# Patient Record
Sex: Male | Born: 1935 | Race: White | Hispanic: No | Smoking: Former smoker
Health system: Southern US, Community
[De-identification: ages and names within clinical notes are randomized; demographics above are authoritative.]

## PROBLEM LIST (undated history)

## (undated) DIAGNOSIS — F329 Major depressive disorder, single episode, unspecified: Secondary | ICD-10-CM

## (undated) DIAGNOSIS — E119 Type 2 diabetes mellitus without complications: Secondary | ICD-10-CM

## (undated) DIAGNOSIS — Z973 Presence of spectacles and contact lenses: Secondary | ICD-10-CM

## (undated) DIAGNOSIS — I10 Essential (primary) hypertension: Secondary | ICD-10-CM

## (undated) DIAGNOSIS — F32A Depression, unspecified: Secondary | ICD-10-CM

## (undated) DIAGNOSIS — I4819 Other persistent atrial fibrillation: Secondary | ICD-10-CM

## (undated) DIAGNOSIS — I350 Nonrheumatic aortic (valve) stenosis: Secondary | ICD-10-CM

## (undated) DIAGNOSIS — E7800 Pure hypercholesterolemia, unspecified: Secondary | ICD-10-CM

## (undated) DIAGNOSIS — K219 Gastro-esophageal reflux disease without esophagitis: Secondary | ICD-10-CM

## (undated) DIAGNOSIS — E1151 Type 2 diabetes mellitus with diabetic peripheral angiopathy without gangrene: Secondary | ICD-10-CM

## (undated) DIAGNOSIS — Z9289 Personal history of other medical treatment: Secondary | ICD-10-CM

## (undated) DIAGNOSIS — M1990 Unspecified osteoarthritis, unspecified site: Secondary | ICD-10-CM

## (undated) DIAGNOSIS — I2510 Atherosclerotic heart disease of native coronary artery without angina pectoris: Secondary | ICD-10-CM

## (undated) DIAGNOSIS — E039 Hypothyroidism, unspecified: Secondary | ICD-10-CM

## (undated) HISTORY — DX: Hypothyroidism, unspecified: E03.9

## (undated) HISTORY — PX: CORONARY ANGIOPLASTY WITH STENT PLACEMENT: SHX49

## (undated) HISTORY — DX: Gastro-esophageal reflux disease without esophagitis: K21.9

## (undated) HISTORY — DX: Presence of spectacles and contact lenses: Z97.3

## (undated) HISTORY — PX: BACK SURGERY: SHX140

## (undated) HISTORY — PX: POSTERIOR FUSION LUMBAR SPINE: SUR632

## (undated) HISTORY — DX: Atherosclerotic heart disease of native coronary artery without angina pectoris: I25.10

## (undated) HISTORY — DX: Other persistent atrial fibrillation: I48.19

## (undated) HISTORY — DX: Nonrheumatic aortic (valve) stenosis: I35.0

## (undated) HISTORY — DX: Essential (primary) hypertension: I10

---

## 1989-06-03 DIAGNOSIS — Z9289 Personal history of other medical treatment: Secondary | ICD-10-CM

## 1989-06-03 HISTORY — DX: Personal history of other medical treatment: Z92.89

## 1995-10-04 HISTORY — PX: LUMBAR DISC SURGERY: SHX700

## 1999-01-08 ENCOUNTER — Encounter: Payer: Self-pay | Admitting: Cardiovascular Disease

## 1999-01-08 ENCOUNTER — Inpatient Hospital Stay (HOSPITAL_COMMUNITY): Admission: EM | Admit: 1999-01-08 | Discharge: 1999-01-09 | Payer: Self-pay | Admitting: Emergency Medicine

## 1999-01-09 ENCOUNTER — Encounter: Payer: Self-pay | Admitting: Cardiovascular Disease

## 1999-05-08 ENCOUNTER — Encounter: Payer: Self-pay | Admitting: Emergency Medicine

## 1999-05-08 ENCOUNTER — Emergency Department (HOSPITAL_COMMUNITY): Admission: EM | Admit: 1999-05-08 | Discharge: 1999-05-08 | Payer: Self-pay | Admitting: Emergency Medicine

## 1999-08-16 ENCOUNTER — Encounter: Payer: Self-pay | Admitting: Gastroenterology

## 1999-08-16 ENCOUNTER — Ambulatory Visit (HOSPITAL_COMMUNITY): Admission: RE | Admit: 1999-08-16 | Discharge: 1999-08-16 | Payer: Self-pay | Admitting: Gastroenterology

## 1999-09-09 ENCOUNTER — Inpatient Hospital Stay (HOSPITAL_COMMUNITY): Admission: EM | Admit: 1999-09-09 | Discharge: 1999-09-13 | Payer: Self-pay | Admitting: Emergency Medicine

## 1999-09-09 ENCOUNTER — Encounter: Payer: Self-pay | Admitting: Cardiology

## 1999-12-17 ENCOUNTER — Ambulatory Visit (HOSPITAL_COMMUNITY): Admission: RE | Admit: 1999-12-17 | Discharge: 1999-12-17 | Payer: Self-pay | Admitting: Cardiology

## 2000-01-25 ENCOUNTER — Encounter (HOSPITAL_COMMUNITY): Admission: RE | Admit: 2000-01-25 | Discharge: 2000-02-07 | Payer: Self-pay | Admitting: Cardiology

## 2001-08-21 ENCOUNTER — Ambulatory Visit (HOSPITAL_COMMUNITY): Admission: RE | Admit: 2001-08-21 | Discharge: 2001-08-22 | Payer: Self-pay | Admitting: Cardiology

## 2006-03-20 ENCOUNTER — Encounter: Admission: RE | Admit: 2006-03-20 | Discharge: 2006-03-20 | Payer: Self-pay | Admitting: Otolaryngology

## 2010-08-20 ENCOUNTER — Inpatient Hospital Stay (HOSPITAL_COMMUNITY): Admission: EM | Admit: 2010-08-20 | Discharge: 2010-08-26 | Payer: Self-pay | Admitting: Emergency Medicine

## 2010-08-20 ENCOUNTER — Ambulatory Visit: Payer: Self-pay | Admitting: Cardiovascular Disease

## 2010-08-20 ENCOUNTER — Ambulatory Visit: Payer: Self-pay | Admitting: Internal Medicine

## 2010-10-03 HISTORY — PX: CHOLECYSTECTOMY: SHX55

## 2010-12-15 LAB — LIPID PANEL
LDL Cholesterol: 80 mg/dL (ref 0–99)
Total CHOL/HDL Ratio: 6.3 RATIO
VLDL: 36 mg/dL (ref 0–40)

## 2010-12-15 LAB — BASIC METABOLIC PANEL
BUN: 10 mg/dL (ref 6–23)
BUN: 13 mg/dL (ref 6–23)
CO2: 21 mEq/L (ref 19–32)
CO2: 23 mEq/L (ref 19–32)
Calcium: 9.3 mg/dL (ref 8.4–10.5)
Chloride: 100 mEq/L (ref 96–112)
Chloride: 104 mEq/L (ref 96–112)
Chloride: 97 mEq/L (ref 96–112)
Creatinine, Ser: 0.75 mg/dL (ref 0.4–1.5)
GFR calc Af Amer: >60 mL/min (ref >60)
GFR calc Af Amer: >60 mL/min (ref >60)
GFR calc non Af Amer: >60 mL/min (ref >60)
Glucose, Bld: 402 mg/dL — ABNORMAL HIGH (ref 70–99)
Potassium: 3.9 mEq/L (ref 3.5–5.1)
Potassium: 4.1 mEq/L (ref 3.5–5.1)
Sodium: 133 mEq/L — ABNORMAL LOW (ref 135–145)

## 2010-12-15 LAB — CBC
HCT: 40.3 % (ref 39.0–52.0)
HCT: 40.9 % (ref 39.0–52.0)
HCT: 41.2 % (ref 39.0–52.0)
Hemoglobin: 12.8 g/dL — ABNORMAL LOW (ref 13.0–17.0)
Hemoglobin: 13.1 g/dL (ref 13.0–17.0)
Hemoglobin: 13.2 g/dL (ref 13.0–17.0)
Hemoglobin: 13.7 g/dL (ref 13.0–17.0)
MCH: 26.4 pg (ref 26.0–34.0)
MCH: 26.7 pg (ref 26.0–34.0)
MCH: 27 pg (ref 26.0–34.0)
MCHC: 32 g/dL (ref 30.0–36.0)
MCV: 83.2 fL (ref 78.0–100.0)
MCV: 83.6 fL (ref 78.0–100.0)
MCV: 84 fL (ref 78.0–100.0)
MCV: 84.2 fL (ref 78.0–100.0)
Platelets: 172 10*3/uL (ref 150–400)
Platelets: 186 10*3/uL (ref 150–400)
RBC: 4.8 MIL/uL (ref 4.22–5.81)
RBC: 4.82 MIL/uL (ref 4.22–5.81)
RBC: 4.95 MIL/uL (ref 4.22–5.81)
RBC: 5.18 MIL/uL (ref 4.22–5.81)
RDW: 15.4 % (ref 11.5–15.5)
RDW: 15.5 % (ref 11.5–15.5)
WBC: 4.3 10*3/uL (ref 4.0–10.5)
WBC: 5.1 10*3/uL (ref 4.0–10.5)
WBC: 5.8 10*3/uL (ref 4.0–10.5)

## 2010-12-15 LAB — GLUCOSE, CAPILLARY
Glucose-Capillary: 168 mg/dL — ABNORMAL HIGH (ref 70–99)
Glucose-Capillary: 190 mg/dL — ABNORMAL HIGH (ref 70–99)
Glucose-Capillary: 204 mg/dL — ABNORMAL HIGH (ref 70–99)
Glucose-Capillary: 206 mg/dL — ABNORMAL HIGH (ref 70–99)
Glucose-Capillary: 211 mg/dL — ABNORMAL HIGH (ref 70–99)
Glucose-Capillary: 224 mg/dL — ABNORMAL HIGH (ref 70–99)
Glucose-Capillary: 253 mg/dL — ABNORMAL HIGH (ref 70–99)
Glucose-Capillary: 269 mg/dL — ABNORMAL HIGH (ref 70–99)
Glucose-Capillary: 273 mg/dL — ABNORMAL HIGH (ref 70–99)
Glucose-Capillary: 274 mg/dL — ABNORMAL HIGH (ref 70–99)
Glucose-Capillary: 334 mg/dL — ABNORMAL HIGH (ref 70–99)

## 2010-12-15 LAB — MAGNESIUM
Magnesium: 1.7 mg/dL (ref 1.5–2.5)
Magnesium: 1.8 mg/dL (ref 1.5–2.5)

## 2010-12-15 LAB — HEPATIC FUNCTION PANEL
ALT: 128 U/L — ABNORMAL HIGH (ref 0–53)
ALT: 174 U/L — ABNORMAL HIGH (ref 0–53)
AST: 33 U/L (ref 0–37)
Alkaline Phosphatase: 199 U/L — ABNORMAL HIGH (ref 39–117)
Alkaline Phosphatase: 228 U/L — ABNORMAL HIGH (ref 39–117)
Bilirubin, Direct: 0.5 mg/dL — ABNORMAL HIGH (ref 0.0–0.3)
Bilirubin, Direct: 0.5 mg/dL — ABNORMAL HIGH (ref 0.0–0.3)
Indirect Bilirubin: 0.6 mg/dL (ref 0.3–0.9)
Indirect Bilirubin: 0.7 mg/dL (ref 0.3–0.9)
Total Bilirubin: 1.1 mg/dL (ref 0.3–1.2)
Total Protein: 6.7 g/dL (ref 6.0–8.3)

## 2010-12-15 LAB — CARDIAC PANEL(CRET KIN+CKTOT+MB+TROPI)
CK, MB: 1.6 ng/mL (ref 0.3–4.0)
Relative Index: INVALID (ref 0.0–2.5)
Total CK: 56 U/L (ref 7–232)
Troponin I: 0.04 ng/mL (ref 0.00–0.06)

## 2010-12-15 LAB — PROTIME-INR
INR: 0.96 (ref 0.00–1.49)
INR: 1.56 — ABNORMAL HIGH (ref 0.00–1.49)
Prothrombin Time: 13.1 seconds (ref 11.6–15.2)
Prothrombin Time: 18.9 seconds — ABNORMAL HIGH (ref 11.6–15.2)

## 2010-12-15 LAB — CULTURE, BLOOD (ROUTINE X 2)
Culture  Setup Time: 201111191342
Culture: NO GROWTH

## 2010-12-15 LAB — HEMOGLOBIN A1C: Hgb A1c MFr Bld: 9.3 % — ABNORMAL HIGH (ref <5.7)

## 2010-12-15 LAB — HEPARIN LEVEL (UNFRACTIONATED)
Heparin Unfractionated: 0.15 IU/mL — ABNORMAL LOW (ref 0.30–0.70)
Heparin Unfractionated: 0.2 IU/mL — ABNORMAL LOW (ref 0.30–0.70)

## 2010-12-15 LAB — POCT I-STAT, CHEM 8
Calcium, Ion: 1.11 mmol/L — ABNORMAL LOW (ref 1.12–1.32)
Chloride: 103 mEq/L (ref 96–112)
Glucose, Bld: 422 mg/dL — ABNORMAL HIGH (ref 70–99)
HCT: 43 % (ref 39.0–52.0)
TCO2: 19 mmol/L (ref 0–100)

## 2010-12-15 LAB — TSH
TSH: 1.953 u[IU]/mL (ref 0.350–4.500)
TSH: 7.529 u[IU]/mL — ABNORMAL HIGH (ref 0.350–4.500)

## 2010-12-15 LAB — POCT CARDIAC MARKERS: Troponin i, poc: <0.05 ng/mL (ref 0.00–0.09)

## 2010-12-15 LAB — DIFFERENTIAL
Basophils Absolute: 0 10*3/uL (ref 0.0–0.1)
Basophils Relative: 0 % (ref 0–1)
Eosinophils Relative: 0 % (ref 0–5)
Monocytes Absolute: 0.6 10*3/uL (ref 0.1–1.0)
Monocytes Relative: 9 % (ref 3–12)
Neutro Abs: 5.7 10*3/uL (ref 1.7–7.7)

## 2010-12-15 LAB — CK TOTAL AND CKMB (NOT AT ARMC)
CK, MB: 1.6 ng/mL (ref 0.3–4.0)
Total CK: 62 U/L (ref 7–232)

## 2011-02-18 NOTE — H&P (Signed)
[place]. [facility]  Patient:    Riley Jordan                         MRN: [id number] Adm. Date:  [id number] Attending:  Corliss Marcus Dictator:   Anselm Lis, N.P. CC:         Dr. Jeannetta Nap                         History and Physical  PRIMARY CARE PHYSICIAN:  Dr. Jeannetta Nap.  SUBJECTIVE:  Mr. Gloor is a pleasant 75 year old with history of hypertension, hypothyroidism who, while driving home, developed left anterior chest pressure ith radiation to jaw, with associated nausea and diaphoresis.  Rated 9/10 on pain scale.  He took four baby aspirin, one sublingual nitrate, with little improvement. Summoned EMS, where he received an additional four sublingual nitrates with an improvement in pain to 4/10 from prior 9/10.  Presented to [facility] emergency  room.  EKG was consistent with inferior myocardial infarction.  He was initiated on IV heparin and IV nitrates, and taken urgently for cardiac catheterization for coronary angiogram, probable PCI, by Dr. Amil Amen.  Patient did have an episode of shortness of breath in April 2000, for which he subsequently underwent a stress test by Dr. Leodis Sias, which was reportedly okay.  CARDIAC RISK FACTORS:  Age, male sex, hypertension, remote tobacco use.  PREVIOUS MEDICAL HISTORY:  1. Hypertension.  2. Hypothyroidism.  3. GERD.  Previous surgical history of lower back surgery x 2 in 1998 by Dr. Montez Morita in [place].  Patient denies history of peptic ulcer disease, diabetes mellitus, cancer, asthma, nor cardiac murmur.  ALLERGIES:  No known drug allergies; okay with seafood, shellfish, and IV products.  MEDICATIONS: 1. Verapamil (uncertain dosage) once daily. 2. Prevacid 30 mg 1 p.o. q.d. 3. Synthroid 112 mcg p.o. q.d. 4. Sublingual nitrate p.r.n.  SOCIAL/HABITS:  Tobacco use - Quit in 1990, prior 35-pack-year history. ETOH - Negative.  Caffeine - None accepted.  Patient works as a IT consultant.  He has been married for 43 years and has two daughters and one son, alive and well, who live locally.  FAMILY HISTORY:  Mother deceased age 59 of multiple myeloma; no CAD.  Dad died t age 10 of leukemia.  Patient has two sisters and one brother without CAD.  REVIEW OF SYSTEMS:  Wears glasses; has upper dentures.  No difficulty with hearing. Denies a history of syncope, near-syncopal episodes, lightheadedness, nor dizziness.  Negative dysphagia with fluid or food.  Positive symptoms of GERD, feeling epigastric discomfort with sour taste, choking, and burning.  Prevacid as improved symptoms.  Negative melena, no bright red blood PR.  Negative constipation or diarrhea.  No hematuria nor dysuria.  Denies history of orthopnea, PND, nor pedal edema.  No symptoms of claudication or cardiac palpitation.  Does have some hand swelling and joint pain in his hands.  PHYSICAL EXAMINATION:  VITAL SIGNS:  Blood pressure 153/108 with heart rate 80s and regular. Respiratory rate 18, temperature 98.2.  O2 saturation 98%.  GENERAL:  He is a well-nourished, anxious 75 year old with continuing chest discomfort, though improved.  HEENT:  Brisk bilateral carotid upstrokes without bruits.  NECK:  No JVD nor thyromegaly.  CHEST:  Lung sounds clear with equal ______ excursion.  Negative CPA tenderness.  CARDIAC:  Regular rate and rhythm without murmur, rub, nor gallop.  Normal S1  and S2.  ABDOMEN:  Protruding, normal bowel sounds, nontender to palpation, no masses nor organomegaly.  Negative abdominal aorta, reveals no femoral bruit.  EXTREMITIES:  Reveals +2/4 bilateral radial, femoral, dorsalis pedis, and posterior tibial, negative pedal edema.  NEUROLOGIC:  Cranial nerves II-XII grossly intact; alert and oriented x 3.  GENITAL/RECTAL:  Deferred.  LABORATORY TESTS AND DATA:  Revealed a hemoglobin of 15.1 with WBCs 8.5 and platelets of 243.  Sodium 137, K 3.6, chloride  99, CO2 30, BUN 10, creatinine 1.2, and glucose 101.  LFTs within normal range.  PT is 13.6 with INR of 1.1 and PTT 28. First CK of 142 with MB fraction 4.2, relative index 3.2, troponin I of 0.04.  Chest x-ray revealed no active disease.  EKG revealed NSR, 84 beats per minute, with ST elevation inferiorly and anterior ST depression/flattening consistent with acute inferior myocardial infarction.  IMPRESSION: 1. Acute inferior myocardial infarction. 2. History of hypertension. 3. Hypothyroidism; supplemented.  PLAN:  Urgently for cardiac catheterization and subsequent coronary angiogram, nd anticipated percutaneous intervention, if able.  The risks, potential complications, benefits, and alternatives to procedure discussed with Mr. Evrard. Patient indicates questions and concerns have been addressed, and is agreeable o proceed. DD:  09/10/99 TD:  09/11/99 Job: [id number]

## 2011-02-18 NOTE — Cardiovascular Report (Signed)
[place]. [facility]  Patient:    Riley Jordan                         MRN: [id number] Proc. Date: 09/09/99 Adm. Date:  [id number] Attending:  Corliss Marcus CC:         Hadassah Pais. Jeannetta Nap, M.D.             Cardiac Catheterization Laboratory                        Cardiac Catheterization  CINE NUMBER:  [id number]  PROCEDURES PERFORMED: 1. Left heart catheterization. 2. Coronary angiography. 3. Left ventriculogram. 4. Percutaneous transluminal coronary angioplasty with stent implantation, large    circumflex marginal branch. 5. Percutaneous transluminal coronary angioplasty with stent implantation, mid    left anterior descending.  INDICATIONS:  Mr. Riley Jordan is a 75 year old man who presented to the emergency room today about 1830 hours complaining of anterior substernal chest pain earlier, associated with nausea and diaphoresis.  An electrocardiogram was diagnostic of  inferior wall myocardial infarction.  He is brought now to the cardiac catheterization laboratory to identify the extent of disease and to allow for percutaneous coronary intervention as definitive treatment for acute MI.  DESCRIPTION OF PROCEDURE:  The patient was brought to the cardiac catheterization laboratory where the right groin was prepared and draped in the usual sterile fashion.  Local anesthesia was obtained with the infiltration of 0.1% lidocaine.  A 7 French catheter sheath was introduced percutaneously into the right femoral artery utilizing an anterior approach over a guiding J wire.  The patient then received 4000 units of heparin intravenously.  A 6 French #4 left Judkins catheter was then advanced to the ascending aorta where the left coronary os was engaged and cineangiography performed to the left coronary artery in multiple LAO and RAO projections.  This catheter was then exchanged for a 6 [place] #4 right Judkins catheter.  Cineangiography of the right coronary  artery was conducted in multiple LAO and RAO projections.  The right coronary catheter was then exchanged for a  French 110 cm pigtail catheter.  Pressure was recorded with in catheter in the ascending aorta and in the left ventricle, both prior to and following the ventriculogram.  A 30 degree RAO cine left ventriculogram was performed utilizing a power injector.  Then, 45 cc of nonionic contrast material was injected at 13 cc/sec.  Preparations were then made to proceed with coronary intervention.  The pigtail  catheter was exchanged for a 7 [place] FL4 Sci-Med Wiseguide catheter.  This was  advanced to the ascending aorta where the left coronary os was engaged.  A 0.014 inch Sci-Med luge intracoronary guide wire was passed easily across the complete obstruction in a large marginal branch.  Initial balloon dilatation was performed with a 3.0/20 mm Sci-Med Adante catheter.  This was inflated to a peak pressure of 6 atmospheres for peak duration of 60 seconds.  The Adante balloon was then removed and an attempt was made to place a 3.0/25 mm NIR Royal coronary stent. However, this would not advance into across the lesion.  It was therefore removed and exchanged for a ACS Guidant Tetra 3.0/23 mm intracoronary stent.  This was placed across the lesion and ultimately deployed at 16 atmospheres for 73 seconds.  A second 3.5/13 mm Tetra stent was then placed proximally and ultimately deployed at 12 atmospheres.  There was  a 70% residual stenosis distal to the stent. Initially, this was treated with the 3.0/20 mm Adante with a reasonable angiographic result. Attention was then directed to the mid LAD where there was an 80% stenosis. The luge intracoronary guide wire was advanced across the LAD lesion and the Adante  balloon inflated to 6 atmospheres across this lesion.  It was then withdrawn and the 3.0/25 mm NIR Royal advanced across the lesion.  This was ultimately deployed at  16 atmospheres for 37 seconds.  While performing coronary angiography in orthogonal views, after completing the LAD dilatation, it was noted that the distal lesion in the circumflex had recoiled and was 80% stenotic.  Therefore the wire was withdrawn and advanced into the circumflex artery.  A 3.0/16 mm NIR with SOX was chosen and advanced across the previously stented segment into the distal portion. It was ultimately deployed at 13 atmospheres for 79 seconds.  After confirming adequate patency in the orthogonal views before the left circumflex artery, the wire was withdrawn and once again advanced across the lesion in the left anterior descending artery where the stented segment was present.  3.25/13 mm Sci-Med Maxxum balloon was passed into the proximal portion of the stent and ultimately inflated to 18 atmospheres for 49 seconds.  This was because of  residual indentation on the superior aspect of the LAD stent.  At the completion of the procedure, and following confirmation of adequate patency in orthogonal views for both the left circumflex and left anterior descending artery, the guiding catheter was removed.  The catheter sheath was sutured in place.  The patient was transported to the recovery area in stable condition with intact distal pulses.  FLUOROSCOPY TIME:  27.1 minutes.  TOTAL CONTRAST UTILIZED:  50 cc of Omnipaque and 370 cc of Hexabrix.  The patient also received the previously mentioned 4000 units of heparin and an ACT obtained was 173 seconds, and therefore received an additional 2500 units of heparin and a subsequent ACT was 272.  At the close of the procedure, the ACT was 227 seconds and the patient received an additional 2000 units of heparin.  HEMODYNAMICS:  Systemic arterial pressure was 162/101 mmHg with a mean pressure of 130 mmHg.  The left ventricular end-diastolic pressure was 36 mmHg preventriculogram and 38 mmHg post ventriculogram.  There  was no systolic gradient across the aortic valve.  ANGIOGRAPHY:  LEFT VENTRICULOGRAM:  The left ventriculogram demonstrated normal left ventricular  size.  There was wide spread akinesis of the anterolateral wall and the inferoapical wall.  There was mild dyskinesis at the apex.  The estimated ejection fraction is 35-40%.  There was left and right coronary calcification seen. There was catheter induced mitral regurgitation present.  CORONARY ANGIOGRAPHY:  The main left coronary artery was mildly diseased with a distal 20% stenosis. There was a right dominant coronary system present.  The left anterior descending artery had a eccentric 80-90% stenosis in the midportion after the origin of two small diagonal branches.  The ongoing vessel was large transapical and diseased.  There did appear to be a 70% stenosis in the distal segment.  The left circumflex artery and its branches were highly diseased; this vessel had a 30% to 50% stenosis in the proximal segment and then gave rise to a large branching marginal branch.  This was 100% occluded proximally.  There was trivial antegrade flow with dye staining indicating thrombus.  The ongoing AV groove circumflex was small giving rise to a small posterolateral  branch.  The right coronary artery and its branches was moderately diseased; this vessel had luminal irregularity throughout its proximal mid and distal segment.  The greatest amount of stenosis was approximately 30-40% in the distal portion.  The ostium f the posterior descending artery displayed a 50% to 70% stenosis.  It was a moderate sized vessel.  The posterolateral branch and segment were of moderate size and generally free of disease.  Following balloon dilatation and stent implantation of the circumflex marginal branch there was no residual stenosis in the midportion of the marginal branch.  The more distal segment that was trifurcating did demonstrate diffuse  disease.  There was TIMI-III flow at completion.  The left anterior descending artery maintained a residual 20% eccentric stenosis on the superior aspect of the vessel despite the oversized balloon and high pressures.  Collateral vessels were not seen.  FINAL DIAGNOSES: 1. Atherosclerotic cardiovascular disease, three-vessel. 2. Status post successful percutaneous transluminal coronary angioplasty stent    implantation, mid left anterior descending. 3. Status post successful percutaneous transluminal coronary angioplasty with    extensive stent implantation, circumflex marginal branch; approximately 35 mm    of stent were implanted. 4. Diminished left ventricular systolic function with regional wall motion    abnormalities as noted. 5. Elevated left ventricular end-diastolic pressure. 6. Systemic hypertension had resolved by the completion of the procedure    following intravenous heparin, intracoronary nitroglycerin, intracoronary    verapamil, and intravenous nitroglycerin as well as intravenous Lopressor 5 g    x 2. DD:  09/09/99 TD:  09/12/99 Job: [id number]

## 2011-02-18 NOTE — Cardiovascular Report (Signed)
[place]. [facility]  Patient:    Riley Jordan, Riley Jordan Visit Number: [id number] MRN: [id number]          Service Type: CAT Location: [id number] Attending Physician:  Corliss Marcus Dictated by:   Francisca December, M.D. Proc. Date: 08/20/01 Admit Date:  08/21/2001   CC:         Scharlene Corn, M.D.             Cardiac Catheterization Laboratory                        Cardiac Catheterization  PROCEDURES PERFORMED: 1. Percutaneous transluminal coronary angioplasty/Cutting Balloon with    adjunctive brachytherapy. 2. Percutaneous closure right femoral artery (Perclose).  INDICATIONS: The patient is a 75 year old man with known ASCVD, two-vessel, status post PTCA stent LAD, September 09, 1999, and PTCA stent LCX, September 09, 1999. He underwent a recent surveillance Cardiolite with exercise stress revealing reversible anterior apical defect. Coronary angiography has revealed diffuse in-stent re-stenosis of the left anterior descending artery stent, which is most severe at the distal exit point amounting to 75% occlusion.  He is brought now to the cardiac catheterization laboratory for percutaneous revascularization and anticipated brachytherapy.  DESCRIPTION OF PROCEDURE: PCI was conducted following the percutaneous insertion of a 7 French catheter sheath utilizing an anterior approach over a guiding J wire into the right femoral artery. The right groin had previously been prepped and draped in the usual sterile fashion. Local anesthesia was obtained with the infiltration of 1% lidocaine. A 7 French FL4 Scimed wiseguide guiding catheter was advanced to the ascending aorta where the left coronary os was engaged. A 0.014 inch Scimed luge intracoronary guide wire was passed across the lesion without difficulty. Initial balloon dilatation was performed with a 3.25/10 mm Scimed Cutting Balloon. It was inflated at three different sites within the distal, mid and  proximal portions of the stent. The distal portion of the stent was dilated with about 5 mm of balloon protruding from the stent. A total of six inflations were performed with a 3.25/10 mm Cutting Balloon to a maximum pressure of 8 atmospheres for a maximum duration of a minute and a half. This balloon was removed and a 3.5/10 mm Scimed Cutting Balloon was deployed into the distal portion of the stent. It was inflated there to 7 atmospheres on two occasions for a maximum duration of a minute and a half. This resulted in wide patency of the stented segment and just distally to it. Several intracoronary injections of nitroglycerin were necessary. The patient had significant amounts of angina with radiation into the jaw. He was treated with intravenous Versed and fentanyl, a total of 4 mg and 100 mcg, respectively.  Finally, intracoronary brachytherapy was conducted using a 40 mm source train. The dwell time was 3 minutes and 16 seconds. Following Cutting Balloon dilatation and intracoronary brachytherapy, there was wide patency in the stented segment just distal to the stent.  It should be noted the patient received 5200 units of heparin intravenously prior to passage of the guide wire. He also received a double bolus and constant infusion during the case. The resultant ACT was 298 seconds following the 263 seconds at completion.  At the completion of the procedure and following conformation of adequate patency in orthogonal views, the guiding catheter was removed. The catheter sheath was removed and excellent hemostasis obtained with use of Perclose system. A right femoral artery angiogram at 45  degrees RAO angulation was conducted prior to insertion of the Perclose device.  ANGIOGRAPHY: As mentioned, the lesion treated was in the midportion of the anterior descending artery, and it was 75% stenotic at its tightest portion. Through most of the stent, it was approximately 50% stenotic.  Following balloon dilatation and adjunctive intracoronary brachytherapy there was a 10% residual stenosis.  It should be noted after establishment of wide patency throughout the stented segment of the LAD, the distal vessel became markedly increased in diameter. This revealed a distal 70-80% stenosis, which had not been previously visualized due to relatively decreased flow and diffuse mild vasospasm.  Angiogram revealed the right femoral artery to be widely patent. There is some luminal irregularity consistent with plaque. The sheath entered well above the bifurcation into the profunda femoral and the suprafemoral arteries.  Upon final review of the LAD angiogram, it is noted there is occlusion of a small to moderate sized septal perforator undoubtedly caused by plaque shifting during balloon inflation. This originates in the proximal portion of the stent. There is also a small diagonal which arises from within the stented segment, which is diffusely diseased. Flow in this vessel is unchanged.  FINAL IMPRESSION: 1. Successful percutaneous transluminal coronary angioplasty Cutting Balloon    with adjunctive brachytherapy mid left anterior descending artery. 2. Typical angina was reproduced with device insertion and balloon inflation. 3. Successful percutaneous closure of right femoral artery. Dictated by:   Francisca December, M.D. Attending Physician:  Corliss Marcus DD:  08/21/01 TD:  08/22/01 Job: [id number]

## 2011-02-18 NOTE — Discharge Summary (Signed)
[place]. [facility]  Patient:    Riley Jordan                         MRN: [id number] Adm. Date:  [id number] Disc. Date: [id number] Attending:  Corliss Marcus Dictator:   Anselm Lis, N.P.                           Discharge Summary  PRIMARY CARE PHYSICIAN:  Hadassah Pais. Jeannetta Nap, M.D.  PROCEDURES:  (September 09, 1999) Percutaneous transluminal coronary angioplasty/stent, left circumflex; percutaneous transluminal coronary angioplasty/stent, left anterior descending.  CONSULTATIONS:  Nutrition consult by registered dietitian regarding low-fat, low-cholesterol diet.  DISCHARGE DIAGNOSIS/HOSPITAL SUMMARY: 1. Coronary atherosclerotic heart disease:  Riley Jordan is a pleasant 75 year old who    presented to the emergency room complaining of anterior chest discomfort with    associated nausea and diaphoresis.  An EKG was diagnostic of an inferior wall    myocardial infarction.  He was brought urgently to cardiac catheterization lab    where he underwent a percutaneous transluminal coronary angioplasty/stent in    both the left circumflex as well as the left anterior descending.  Left    ventriculogram significant for decreased left ventricular systolic function    with ejection fraction of 40%, anterior, inferior and apical akinesis. Residual    disease occluded a large marginal off the left circumflex.  Fifty percent    ostial posterior descending artery of the right coronary artery.    a. Patient ruled in for acute inferior wall myocardial infarction with peak K       of 1910 and MB fraction of 232 with troponin I 12.1.    b. Left ventricular dysfunction; ischemic, with ejection fraction of 40%;       anterior, inferior and apical akinesis. 2. Hypothyroidism; on supplements. 3. History of gastroesophageal reflux disease; on Prevacid.  Patient did well during the course of admission, ambulating with good tolerance  with cardiac rehab.  PLAN: 1. Patient  discharged home in stable and improved condition. 2. Discharge medications:    A. Plavix 75 mg one p.o. q.d. for three weeks, to take with food.    B. Nitroglycerin tablets 0.4 mg sublingual p.r.n. chest pain.    C. Enteric-coated aspirin 325 mg once daily.    D. Lisinopril 10 mg once daily.    E. Toprol XL 50 mg once day.    F. Synthroid 0.112 mg once daily or at dosage as prior to admission.    G. Prevacid 30 mg once daily. 3. Activity:  As outlined by cardiac rehab.  No work at least until after seen y    Dr. Amil Amen in clinic. 4. Diet:  Low fat, low cholesterol. 5. Wound care:  May shower. 6. Special instructions:  Stop Verapamil. 7. Followup:  Dr. Corliss Marcus, Thursday, September 30, 1999, at 11 a.m.  PREVIOUS MEDICAL HISTORY: 1. Hypertension. 2. Hypothyroidism. 3. GERD.  PREVIOUS SURGICAL HISTORY:  Lower back surgery x 2 in 1997 and 1998 by Dr. Montez Morita in [place], [place].  LABORATORY TESTS AND DATA:  Cholesterol 169 with triglycerides 139, HDL of 22 and LDL of 120.  Sodium 137, potassium of 3.6, chloride 99, CO2 of 30, glucose 101, BUN of 10, creatinine 1.0.  LFTs within normal range, though total bilirubin elevated at 1.2.  Serial cardiac enzymes significant for peak CK of 1910 with MB fraction 232 and troponin  I of 12.1.  CBC:  WBC of 8.5 with hemoglobin of 15.1 and platelets of 243,000.  Admission coagulations were within normal range.  Admission chest x-ray revealed no active disease.  Admission EKG revealed NSR at 84 beats per minute, ST elevation inferiorly with  anterior ST depression/flattening, consistent with acute inferior myocardial infarction. DD:  11/04/99 TD:  11/05/99 Job: [id number]

## 2011-03-08 ENCOUNTER — Emergency Department (HOSPITAL_COMMUNITY): Payer: Medicare Other

## 2011-03-08 ENCOUNTER — Inpatient Hospital Stay (HOSPITAL_COMMUNITY)
Admission: EM | Admit: 2011-03-08 | Discharge: 2011-03-15 | DRG: 853 | Disposition: A | Discharge status: Skilled Nursing Facility | Payer: Medicare Other | Attending: Internal Medicine | Admitting: Internal Medicine

## 2011-03-08 DIAGNOSIS — I2510 Atherosclerotic heart disease of native coronary artery without angina pectoris: Secondary | ICD-10-CM | POA: Diagnosis present

## 2011-03-08 DIAGNOSIS — IMO0001 Reserved for inherently not codable concepts without codable children: Secondary | ICD-10-CM | POA: Diagnosis present

## 2011-03-08 DIAGNOSIS — K838 Other specified diseases of biliary tract: Secondary | ICD-10-CM | POA: Diagnosis present

## 2011-03-08 DIAGNOSIS — K8020 Calculus of gallbladder without cholecystitis without obstruction: Secondary | ICD-10-CM | POA: Diagnosis present

## 2011-03-08 DIAGNOSIS — E8779 Other fluid overload: Secondary | ICD-10-CM | POA: Diagnosis not present

## 2011-03-08 DIAGNOSIS — E876 Hypokalemia: Secondary | ICD-10-CM | POA: Diagnosis not present

## 2011-03-08 DIAGNOSIS — E872 Acidosis, unspecified: Secondary | ICD-10-CM | POA: Diagnosis present

## 2011-03-08 DIAGNOSIS — I4891 Unspecified atrial fibrillation: Secondary | ICD-10-CM | POA: Diagnosis present

## 2011-03-08 DIAGNOSIS — A4151 Sepsis due to Escherichia coli [E. coli]: Principal | ICD-10-CM | POA: Diagnosis present

## 2011-03-08 DIAGNOSIS — E039 Hypothyroidism, unspecified: Secondary | ICD-10-CM | POA: Diagnosis present

## 2011-03-08 DIAGNOSIS — A419 Sepsis, unspecified organism: Secondary | ICD-10-CM | POA: Diagnosis present

## 2011-03-08 DIAGNOSIS — Z794 Long term (current) use of insulin: Secondary | ICD-10-CM

## 2011-03-08 DIAGNOSIS — Z7901 Long term (current) use of anticoagulants: Secondary | ICD-10-CM

## 2011-03-08 DIAGNOSIS — I10 Essential (primary) hypertension: Secondary | ICD-10-CM | POA: Diagnosis present

## 2011-03-08 DIAGNOSIS — Z7982 Long term (current) use of aspirin: Secondary | ICD-10-CM

## 2011-03-08 DIAGNOSIS — K859 Acute pancreatitis without necrosis or infection, unspecified: Secondary | ICD-10-CM | POA: Diagnosis present

## 2011-03-08 LAB — URINALYSIS, ROUTINE W REFLEX MICROSCOPIC
Bilirubin Urine: NEGATIVE
Nitrite: NEGATIVE
Specific Gravity, Urine: 1.028 (ref 1.005–1.030)
Urobilinogen, UA: 1 mg/dL (ref 0.0–1.0)

## 2011-03-08 LAB — COMPREHENSIVE METABOLIC PANEL
Albumin: 3.9 g/dL (ref 3.5–5.2)
BUN: 15 mg/dL (ref 6–23)
Chloride: 100 mEq/L (ref 96–112)
Creatinine, Ser: 0.77 mg/dL (ref 0.4–1.5)
Total Bilirubin: 3.3 mg/dL — ABNORMAL HIGH (ref 0.3–1.2)

## 2011-03-08 LAB — DIFFERENTIAL
Basophils Absolute: 0 10*3/uL (ref 0.0–0.1)
Basophils Relative: 0 % (ref 0–1)
Neutro Abs: 3.8 10*3/uL (ref 1.7–7.7)
Neutrophils Relative %: 93 % — ABNORMAL HIGH (ref 43–77)

## 2011-03-08 LAB — URINE MICROSCOPIC-ADD ON

## 2011-03-08 LAB — CBC
Hemoglobin: 13.6 g/dL (ref 13.0–17.0)
RBC: 5.05 MIL/uL (ref 4.22–5.81)
WBC: 4.1 10*3/uL (ref 4.0–10.5)

## 2011-03-08 LAB — LIPASE, BLOOD: Lipase: 2420 U/L — ABNORMAL HIGH (ref 11–59)

## 2011-03-08 LAB — PROTIME-INR
INR: 2.39 — ABNORMAL HIGH (ref 0.00–1.49)
Prothrombin Time: 26.2 seconds — ABNORMAL HIGH (ref 11.6–15.2)

## 2011-03-08 LAB — LACTIC ACID, PLASMA: Lactic Acid, Venous: 2.5 mmol/L — ABNORMAL HIGH (ref 0.5–2.2)

## 2011-03-09 LAB — DIFFERENTIAL
Basophils Absolute: 0 10*3/uL (ref 0.0–0.1)
Eosinophils Absolute: 0 10*3/uL (ref 0.0–0.7)
Eosinophils Relative: 0 % (ref 0–5)
Monocytes Absolute: 0.7 10*3/uL (ref 0.1–1.0)
Neutrophils Relative %: 84 % — ABNORMAL HIGH (ref 43–77)

## 2011-03-09 LAB — HEMOGLOBIN A1C: Mean Plasma Glucose: 223 mg/dL — ABNORMAL HIGH (ref <117)

## 2011-03-09 LAB — COMPREHENSIVE METABOLIC PANEL WITH GFR
ALT: 347 U/L — ABNORMAL HIGH (ref 0–53)
AST: 375 U/L — ABNORMAL HIGH (ref 0–37)
Albumin: 2.8 g/dL — ABNORMAL LOW (ref 3.5–5.2)
Alkaline Phosphatase: 224 U/L — ABNORMAL HIGH (ref 39–117)
BUN: 17 mg/dL (ref 6–23)
CO2: 18 meq/L — ABNORMAL LOW (ref 19–32)
Calcium: 8.1 mg/dL — ABNORMAL LOW (ref 8.4–10.5)
Chloride: 103 meq/L (ref 96–112)
Creatinine, Ser: 0.87 mg/dL (ref 0.4–1.5)
GFR calc non Af Amer: >60 mL/min
Glucose, Bld: 325 mg/dL — ABNORMAL HIGH (ref 70–99)
Potassium: 4 meq/L (ref 3.5–5.1)
Sodium: 133 meq/L — ABNORMAL LOW (ref 135–145)
Total Bilirubin: 2.5 mg/dL — ABNORMAL HIGH (ref 0.3–1.2)
Total Protein: 5.5 g/dL — ABNORMAL LOW (ref 6.0–8.3)

## 2011-03-09 LAB — LIPID PANEL
Cholesterol: 114 mg/dL (ref 0–200)
HDL: 31 mg/dL — ABNORMAL LOW
Total CHOL/HDL Ratio: 3.7 ratio
Triglycerides: 102 mg/dL
VLDL: 20 mg/dL (ref 0–40)

## 2011-03-09 LAB — CBC
HCT: 34.4 % — ABNORMAL LOW (ref 39.0–52.0)
Platelets: 122 10*3/uL — ABNORMAL LOW (ref 150–400)
RDW: 15.7 % — ABNORMAL HIGH (ref 11.5–15.5)
WBC: 8.5 10*3/uL (ref 4.0–10.5)

## 2011-03-09 LAB — PROTIME-INR
INR: 2.91 — ABNORMAL HIGH (ref 0.00–1.49)
Prothrombin Time: 30.5 s — ABNORMAL HIGH (ref 11.6–15.2)

## 2011-03-09 LAB — GLUCOSE, CAPILLARY
Glucose-Capillary: 323 mg/dL — ABNORMAL HIGH (ref 70–99)
Glucose-Capillary: 240 mg/dL — ABNORMAL HIGH (ref 70–99)
Glucose-Capillary: 233 mg/dL — ABNORMAL HIGH (ref 70–99)
Glucose-Capillary: 184 mg/dL — ABNORMAL HIGH (ref 70–99)

## 2011-03-09 LAB — APTT: aPTT: 41 seconds — ABNORMAL HIGH (ref 24–37)

## 2011-03-09 LAB — LIPASE, BLOOD: Lipase: 782 U/L — ABNORMAL HIGH (ref 11–59)

## 2011-03-09 LAB — MRSA PCR SCREENING: MRSA by PCR: NEGATIVE

## 2011-03-10 ENCOUNTER — Inpatient Hospital Stay (HOSPITAL_COMMUNITY): Payer: Medicare Other

## 2011-03-10 LAB — COMPREHENSIVE METABOLIC PANEL
ALT: 237 U/L — ABNORMAL HIGH (ref 0–53)
AST: 124 U/L — ABNORMAL HIGH (ref 0–37)
Albumin: 2.6 g/dL — ABNORMAL LOW (ref 3.5–5.2)
Alkaline Phosphatase: 182 U/L — ABNORMAL HIGH (ref 39–117)
BUN: 14 mg/dL (ref 6–23)
Chloride: 104 mEq/L (ref 96–112)
GFR calc Af Amer: >60 mL/min (ref >60)
Potassium: 3.8 mEq/L (ref 3.5–5.1)
Sodium: 136 mEq/L (ref 135–145)
Total Bilirubin: 1.9 mg/dL — ABNORMAL HIGH (ref 0.3–1.2)
Total Protein: 5.5 g/dL — ABNORMAL LOW (ref 6.0–8.3)

## 2011-03-10 LAB — GLUCOSE, CAPILLARY
Glucose-Capillary: 185 mg/dL — ABNORMAL HIGH (ref 70–99)
Glucose-Capillary: 190 mg/dL — ABNORMAL HIGH (ref 70–99)
Glucose-Capillary: 187 mg/dL — ABNORMAL HIGH (ref 70–99)

## 2011-03-10 LAB — PROTIME-INR
INR: 2.67 — ABNORMAL HIGH (ref 0.00–1.49)
Prothrombin Time: 28.5 seconds — ABNORMAL HIGH (ref 11.6–15.2)

## 2011-03-10 LAB — CBC
HCT: 34.5 % — ABNORMAL LOW (ref 39.0–52.0)
Hemoglobin: 11.2 g/dL — ABNORMAL LOW (ref 13.0–17.0)
MCHC: 32.5 g/dL (ref 30.0–36.0)
RBC: 4.19 MIL/uL — ABNORMAL LOW (ref 4.22–5.81)

## 2011-03-10 LAB — DIFFERENTIAL
Basophils Absolute: 0 10*3/uL (ref 0.0–0.1)
Lymphocytes Relative: 9 % — ABNORMAL LOW (ref 12–46)
Monocytes Relative: 7 % (ref 3–12)
Neutro Abs: 7.2 10*3/uL (ref 1.7–7.7)
Neutrophils Relative %: 84 % — ABNORMAL HIGH (ref 43–77)

## 2011-03-11 LAB — COMPREHENSIVE METABOLIC PANEL
ALT: 163 U/L — ABNORMAL HIGH (ref 0–53)
AST: 43 U/L — ABNORMAL HIGH (ref 0–37)
Alkaline Phosphatase: 185 U/L — ABNORMAL HIGH (ref 39–117)
CO2: 20 mEq/L (ref 19–32)
Calcium: 8.7 mg/dL (ref 8.4–10.5)
Chloride: 99 mEq/L (ref 96–112)
GFR calc non Af Amer: >60 mL/min (ref >60)
Glucose, Bld: 165 mg/dL — ABNORMAL HIGH (ref 70–99)
Potassium: 3.4 mEq/L — ABNORMAL LOW (ref 3.5–5.1)
Sodium: 133 mEq/L — ABNORMAL LOW (ref 135–145)
Total Bilirubin: 1.4 mg/dL — ABNORMAL HIGH (ref 0.3–1.2)

## 2011-03-11 LAB — GLUCOSE, CAPILLARY
Glucose-Capillary: 173 mg/dL — ABNORMAL HIGH (ref 70–99)
Glucose-Capillary: 181 mg/dL — ABNORMAL HIGH (ref 70–99)

## 2011-03-11 LAB — CULTURE, BLOOD (ROUTINE X 2)

## 2011-03-12 ENCOUNTER — Inpatient Hospital Stay (HOSPITAL_COMMUNITY): Payer: Medicare Other

## 2011-03-12 ENCOUNTER — Other Ambulatory Visit: Payer: Self-pay | Admitting: General Surgery

## 2011-03-12 LAB — CBC
Hemoglobin: 12.4 g/dL — ABNORMAL LOW (ref 13.0–17.0)
MCHC: 33.4 g/dL (ref 30.0–36.0)
RDW: 15.6 % — ABNORMAL HIGH (ref 11.5–15.5)
WBC: 8.3 10*3/uL (ref 4.0–10.5)

## 2011-03-12 LAB — BASIC METABOLIC PANEL
BUN: 11 mg/dL (ref 6–23)
Creatinine, Ser: 0.66 mg/dL (ref 0.4–1.5)
GFR calc Af Amer: >60 mL/min (ref >60)
GFR calc non Af Amer: >60 mL/min (ref >60)
Potassium: 2.9 mEq/L — ABNORMAL LOW (ref 3.5–5.1)

## 2011-03-12 LAB — PROTIME-INR
INR: 1.88 — ABNORMAL HIGH (ref 0.00–1.49)
Prothrombin Time: 21.8 seconds — ABNORMAL HIGH (ref 11.6–15.2)

## 2011-03-12 LAB — GLUCOSE, CAPILLARY
Glucose-Capillary: 192 mg/dL — ABNORMAL HIGH (ref 70–99)
Glucose-Capillary: 164 mg/dL — ABNORMAL HIGH (ref 70–99)
Glucose-Capillary: 207 mg/dL — ABNORMAL HIGH (ref 70–99)

## 2011-03-13 LAB — CBC
HCT: 38 % — ABNORMAL LOW (ref 39.0–52.0)
MCHC: 32.6 g/dL (ref 30.0–36.0)
MCV: 81.4 fL (ref 78.0–100.0)
RDW: 16 % — ABNORMAL HIGH (ref 11.5–15.5)

## 2011-03-13 LAB — PROTIME-INR: INR: 1.02 (ref 0.00–1.49)

## 2011-03-13 LAB — BASIC METABOLIC PANEL
CO2: 23 mEq/L (ref 19–32)
Chloride: 98 mEq/L (ref 96–112)
Sodium: 132 mEq/L — ABNORMAL LOW (ref 135–145)

## 2011-03-13 LAB — GLUCOSE, CAPILLARY: Glucose-Capillary: 225 mg/dL — ABNORMAL HIGH (ref 70–99)

## 2011-03-13 LAB — MAGNESIUM: Magnesium: 2 mg/dL (ref 1.5–2.5)

## 2011-03-14 LAB — HEPATIC FUNCTION PANEL
AST: 46 U/L — ABNORMAL HIGH (ref 0–37)
Albumin: 2.9 g/dL — ABNORMAL LOW (ref 3.5–5.2)
Alkaline Phosphatase: 206 U/L — ABNORMAL HIGH (ref 39–117)
Total Bilirubin: 0.7 mg/dL (ref 0.3–1.2)

## 2011-03-14 LAB — BASIC METABOLIC PANEL
BUN: 11 mg/dL (ref 6–23)
CO2: 23 mEq/L (ref 19–32)
Calcium: 8.9 mg/dL (ref 8.4–10.5)
Creatinine, Ser: 0.67 mg/dL (ref 0.4–1.5)
Glucose, Bld: 258 mg/dL — ABNORMAL HIGH (ref 70–99)

## 2011-03-14 LAB — CBC
HCT: 38.3 % — ABNORMAL LOW (ref 39.0–52.0)
Hemoglobin: 12.2 g/dL — ABNORMAL LOW (ref 13.0–17.0)
MCH: 26 pg (ref 26.0–34.0)
MCV: 81.7 fL (ref 78.0–100.0)
RBC: 4.69 MIL/uL (ref 4.22–5.81)

## 2011-03-15 LAB — CBC
HCT: 36.8 % — ABNORMAL LOW (ref 39.0–52.0)
Hemoglobin: 11.9 g/dL — ABNORMAL LOW (ref 13.0–17.0)
MCHC: 32.3 g/dL (ref 30.0–36.0)
RBC: 4.49 MIL/uL (ref 4.22–5.81)

## 2011-03-15 LAB — BASIC METABOLIC PANEL
BUN: 12 mg/dL (ref 6–23)
Chloride: 96 mEq/L (ref 96–112)
GFR calc Af Amer: >60 mL/min (ref >60)
Glucose, Bld: 257 mg/dL — ABNORMAL HIGH (ref 70–99)
Potassium: 3.7 mEq/L (ref 3.5–5.1)
Sodium: 133 mEq/L — ABNORMAL LOW (ref 135–145)

## 2011-03-15 LAB — GLUCOSE, CAPILLARY: Glucose-Capillary: 323 mg/dL — ABNORMAL HIGH (ref 70–99)

## 2011-03-17 NOTE — Discharge Summary (Signed)
[name], GROUND                 ACCOUNT NO.:  [id number]  MEDICAL RECORD NO.:  [id number]  LOCATION:  3741                         FACILITY:  [facility]  PHYSICIAN:  Riley Ranger, MD       DATE OF BIRTH:  05-27-1936  DATE OF ADMISSION:  03/08/2011 DATE OF DISCHARGE:  03/15/2011                              DISCHARGE SUMMARY   PRIMARY CARE PHYSICIAN:  Riley Guard, MD  DISCHARGE DIAGNOSES: 1. Sepsis with metabolic acidosis and coagulopathy. 2. Gallstone pancreatitis, severe. 3. Status post laparoscopic cholecystectomy with cholangiogram on March 12, 2011. 4. Obstructive jaundice. 5. Diabetes mellitus, poorly controlled. 6. Coronary artery disease, status post stent. 7. Atrial fibrillation. 8. Hypothyroidism. 9. Escherichia coli sepsis.  CONSULTATION:  General Surgery, Dr. Jimmye Norman.  DISCHARGE MEDICATION: 1. Cepacol throat lozenges 1 tablet p.o. every 4 hours as needed for     cough or sore throat. 2. Ciprofloxacin 500 mg p.o. twice daily for another 7 days. 3. Diltiazem 30 mg p.o. every 8 hours. 4. Flonase 1 spray nasally twice daily. 5. Percocet 5/325 mg 1-2 tablets p.o. every 6 hours as needed for     pain. 6. Psyllium fiber pack 1 packet p.o. daily. 7. Zovirax topical 1 application topically 4 times daily ________     cleared for 10 days. 8. Actos 30 mg p.o. daily at bedtime. 9. Aspirin 81 mg p.o. q.p.m. 10.Amiodarone 200 mg 2 tablets p.o. twice daily. 11.Humulin 70/30 40 units subcu b.i.d. 12.Isosorbide mononitrate XR 30 mg p.o. daily at bedtime. 13.Levothyroxine 137 mcg 1 tablet p.o. q.a.m. 14.Metformin 500 mg in breakfast and 500 mg at bedtime and 1000 mg at     lunch. 15.Metoprolol 25 mg p.o. b.i.d. 16.Omeprazole 20 mg p.o. q.a.m. 17.Coumadin per patient's schedule.  DISCHARGE INSTRUCTIONS:  The patient was advised to stop taking only medication which included Zetia because of the transaminitis.  BRIEF HISTORY OF PRESENT ILLNESS:  At the time of  admission, Riley Jordan is a 75 year old male, who stated that for the past 6 months or so he had intermittent episodes of abdominal discomfort, particularly after fatty meals that he had not paid to much attention to them.  However, on the day of admission the patient had visited his wife who was in the hospital, he went and had a lunch of crutches acute episode of epigastric pain and abdominal cramping.  He felt strong urge to have a bowel movement and then went and had emesis x2.  He had shaking chills and continued abdominal pain, hence he presented to the emergency department.  RADIOLOGICAL DATA:  CT of the abdomen and pelvis on June 5 showed CT findings consistent with acute pancreatitis, no complicating features demonstrated, 2 multiple gallstones in the gallbladder and mildly dilated common bile duct, no obvious common bile duct stones, 3 small duodenal diverticulum.  Chest x-ray on June 7, bilateral lower lobe opacities likely atelectasis, small bilateral effusions.  Cholangiogram of June 9 showed mildly dilated biliary tree with rapid tapering near the ampulla, no filling defects.  PROCEDURE:  On June 9,  laparoscopic cholecystectomy with a cholangiogram.  PERTINENT LAB DIAGNOSTIC DATA:  Blood cultures on June  6 showed E. Coli. CBC at the time of admission had shown white count of 4.1, hemoglobin 13.6, hematocrit 41.4 with neutrophils of 93%.  UA was negative for UTI. Lipase was elevated at 2420, lactic acid elevated at 2.5.  MRSA screening, PCR negative.  Repeat blood cultures on June 11 so far has been negative.  BRIEF HOSPITALIZATION COURSE:  Riley Jordan is a 75 year old male who presented with shaking chills, metabolic acidosis, and abdominal pain. He was found to have severe pancreatitis. 1. Acute severe gallstone pancreatitis.  The patient was admitted to     the Intensive Care Unit.  He was started on IV fluid boluses for     fluid resuscitation.  The patient was placed  on Zosyn and Flagyl.     The patient was transferred to the monitored floor on March 09, 2011.     General surgery was consulted and the patient remained on  n.p.o.     status, on IV fluids with pain control.  He underwent laparoscopic     cholecystectomy with cholangiogram on March 12, 2011.     Postoperatively, the patient did well and is currently tolerating     the low-fat diet.  Per Surgery recommendation, the patient is     cleared to be discharged to skilled nursing facility.  His Coumadin     can be resume today. 2. E. coli sepsis.  The patient was initially placed on Zosyn and     Flagyl at the time of admission and subsequently antibiotics added     down to Rocephin per the sensitivities.  He should still continue     p.o. ciprofloxacin per sensitivities for another 7 days to complete     the course. 3. Diabetes mellitus.  The patient was placed on insulin while     inpatient.  He should continue Humulin 70/30 with Actos and     metformin at the facility. 4. Atrial fibrillation.  The patient was continued on Cardizem and     amiodarone.  Coumadin can be resumed today.  The patient will be     discharged to the skilled nursing facility per the PT/OT     recommendations.  He should follow up with Riley Jordan in 2-3 weeks     and his primary care physician Dr. Windle Jordan.  PHYSICAL EXAMINATION:  VITAL SIGNS:  At the time of discharge, Temperature 98.6, pulse 71, respirations 18, blood pressure 119/71, and O2 sats 93% on room air. GENERAL:  The patient is alert, awake and oriented x3, not in acute distress. HEENT:  Pupils reactive to light and accommodation.  EOMI. NECK:  Supple.  No lymphadenopathy. CVS:  S1 and S2, clear. CHEST:  Fairly clear to auscultation bilaterally. ABDOMEN:  Soft, nontender.  Incisions clean.  No draining. EXTREMITIES:  No cyanosis, clubbing, or edema noted above lower extremities bilaterally.  DISCHARGE DIET:  Carb-modified low fat diet.  DISCHARGE  FOLLOWUP:  With Dr. Jimmye Norman within next 2 weeks and Dr. Windle Jordan in next 2-3 weeks.  Discharge time 35 minutes.     Riley Ranger, MD     RR/MEDQ  D:  03/15/2011  T:  03/15/2011  Job:  [id number]  cc:   Riley Jordan, M.D. Riley Jordan, M.D.  Electronically Signed by Andres Labrum Giannie Soliday  on 03/17/2011 03:33:47 PM

## 2011-03-20 LAB — CULTURE, BLOOD (ROUTINE X 2)
Culture  Setup Time: 201206112315
Culture: NO GROWTH

## 2011-03-22 ENCOUNTER — Encounter (INDEPENDENT_AMBULATORY_CARE_PROVIDER_SITE_OTHER): Payer: Self-pay | Admitting: General Surgery

## 2011-03-22 NOTE — Op Note (Signed)
Riley Jordan, Riley Jordan                 ACCOUNT NO.:  [id number]  MEDICAL RECORD NO.:  [id number]  LOCATION:  3741                         FACILITY:  [facility]  PHYSICIAN:  Cherylynn Ridges, M.D.    DATE OF BIRTH:  Dec 12, 1935  DATE OF PROCEDURE:  03/12/2011 DATE OF DISCHARGE:                              OPERATIVE REPORT   PREOPERATIVE DIAGNOSES:  Cholelithiasis and gallstone pancreatitis.  POSTOPERATIVE DIAGNOSES:  Cholelithiasis and gallstone pancreatitis.  PROCEDURE:  Laparoscopic cholecystectomy with cholangiogram.  SURGEON:  Cherylynn Ridges, MD  ANESTHESIA:  General endotracheal.  ESTIMATED BLOOD LOSS:  Less than 20 mL.  COMPLICATIONS:  None.  CONDITION:  Stable.  FINDINGS:  Normal intraoperative cholangiogram.  INDICATIONS FOR OPERATION:  The patient was admitted on March 08, 2011, with gallstone pancreatitis.  He was anticoagulated because of atrial fibrillation, intermittent paroxysmal atrial fibrillation which we allowed to resolve without reversal.  He now [place] comes to the operating room for a laparoscopic cholecystectomy.  OPERATION:  The patient was taken to the operating room, placed on table in supine position.  After an adequate general endotracheal anesthetic was administered, he was prepped and draped in the usual sterile manner exposing his entire abdomen.  After a proper time-out was performed identifying the patient and the procedure to be performed, a supraumbilical midline incision was made using #15 blade and taken down through the midline fascia.  We grabbed the midline fascia using Kocher clamps and incised between the clamps using the 15 blade.  This exposed the preperitoneal layer which we subsequently bluntly dissected through with a Kelly clamp.  Once we were in the peritoneal cavity, a pursestring suture of 0 Vicryl was passed around the fascial opening was secured in a Hasson cannula.  Through the Hasson cannula we insufflated carbon dioxide gas up to a  maximal intra- abdominal pressure of 50 mmHg.  Once this was done, the patient was placed in reverse Trendelenburg.  Through right costal margin 5-mm cannulas and a subxiphoid 11-12 mm cannula was passed under direct vision.  Once all cannulae were placed, the dissection was begun.  We retracted the gallbladder towards the anterior abdominal wall in the right upper quadrant and then used a second retractor on the infundibulum.  This opened up the peritoneum overlying the triangle of Calot and hepatoduodenal triangle.  We dissected out the cystic duct and the cystic artery.  The cystic duct was clipped along the gallbladder side x1, then a cholecystodochotomy made and a Cook catheter which had been passed through the anterior abdominal wall for performing a cholangiogram.  The cholangiogram showed good flow into the duodenum, good proximal filling, no intraductal filling defects and no dilatation.  We removed the clip securing the catheter in place and removed the catheter and clipped the distal cystic duct x3.  We then transected it.  We identified the cystic artery which was coming off a right hepatic artery which actually came across the triangle of Calot towards the gallbladder and then looped back around towards the midline structures. We stayed away from clipping the right hepatic artery.  We subsequently dissected out the gallbladder from its bed.  There was a tear  in the posterior wall so we used an EndoCatch bag to retrieve it from the supraumbilical site.  There was spillage of bile but no stones.  We irrigated with saline solution up to a liter of saline.  All the bowel was retrieved.  Once we had aspirated all fluid and gas from above the liver, we removed all cannulas tying off the supraumbilical fascia using a pursestring suture which was in place.  Once this was done, we injected 0.25% Marcaine with epi at all sites. We closed the subxiphoid and supraumbilical skin  sites using running subcuticular stitch of 4-0 Monocryl.  Dermabond, Steri-Strips and Tegaderm were used to complete all dressings.  All needle counts, sponge counts and instrument counts were correct.     Cherylynn Ridges, M.D.     JOW/MEDQ  D:  03/12/2011  T:  03/13/2011  Job:  [id number]  Electronically Signed by Jimmye Norman M.D. on 03/22/2011 08:19:11 AM

## 2011-03-22 NOTE — Consult Note (Signed)
NAMEJSHAWN, HURTA NO.:  [id number]  MEDICAL RECORD NO.:  [id number]  LOCATION:  3741                         FACILITY:  [facility]  PHYSICIAN:  Cherylynn Ridges, M.D.    DATE OF BIRTH:  April 17, 1936  DATE OF CONSULTATION:  03/09/2011 DATE OF DISCHARGE:                                CONSULTATION   Thank you very much for asking me to see Mr. Riley Jordan, a very pleasant 75- year-old gentleman with likely gallstone pancreatitis.  Upon questioning the patient, he has had several months of intermittent epigastric abdominal pain not radiating to the right upper quadrant, not radiating to the left side and not radiating to the back.  His most recent episode started yesterday as pretty severe.  It occurred while he was visiting his wife who actually is in the hospital for an accidental injury.  The pain is in the same place where it has been before but worse.  He had nausea but no vomiting.  He was admitted initially to 4700 and transferred to the ICU because of lactic acidosis and significant concern for cardiac events.  He does have a history of coronary artery disease.  PAST MEDICAL HISTORY: 1. Insulin dependent diabetes. 2. Coronary artery disease status post stent in 2002. 3. Atrial fibrillation requiring Coumadin therapy.  PAST SURGICAL HISTORY:  He has had back surgeries x2 in the 90s.  No significant problems or surgery since then.  He has had no previous abdominal surgery.  MEDICATIONS:  On admission are; 1. Warfarin. 2. Coumadin 5 mg p.o. daily. 3. Zetia 10 mg daily. 4. Omeprazole 20 mg daily. 5. Metoprolol 50 mg twice a day. 6. Metformin 1 g p.o. 3 times a day; 7. Levothyroxine 137 mcg daily. 8. Isosorbide mononitrate XR 30 mg once a day. 9. Humulin 70/30 40 units twice day. 10.Diltiazem 240 mg p.o. daily. 11.Baby aspirin a day. 12.Amiodarone 200 mg p.o. twice a day. 13.He also takes Actos 30 mg p.o. once a day.  ALLERGIES:  He has no known drug  allergies.  PHYSICAL EXAMINATION:  GENERAL AND VITAL SIGNS:  He is afebrile.  His vital signs are stable.  He seems to be moderately uncomfortable. HEENT:  He has got some normocephalic and atraumatic and anicteric. LUNGS:  Clear. CARDIAC:  He is irregularly irregular with rate controlled with his fibrillation. ABDOMEN:  Distended with tenderness in the epigastrium, very hypoactive bowel sounds.  No diffuse peritonitis but locally tender in the midepigastrium. RECTAL:  Not performed. NEURO:  Cranial nerves II-XII grossly intact.  His laboratory studies show white count was normal.  Hemoglobin 11.1. His liver function tests are abnormal, but improving from yesterday. His lipase was over 2000 yesterday, today his level is pending.  IMPRESSION:  Gallstone pancreatitis and the patient with poorly controlled insulin-dependent diabetes, coronary artery disease and atrial fibrillation, on Coumadin.  His INR was 2.91 today up from yesterday even though he has not received any additional Coumadin.  The plan is to let his INR drift down slowly over the next several days as we will need to wait anyway for his pancreatitis to resolve.  If that does not evolve to a worsening  problem, then we will go ahead and likely remove his gallbladder over the next 2-3 days.  If he should become more acutely requiring surgery, then we will more accurately reverse his INR. We will repeat his labs tomorrow and see the patient soon.     Cherylynn Ridges, M.D.     JOW/MEDQ  D:  03/09/2011  T:  03/10/2011  Job:  [id number]  cc:   Medicine Service  Electronically Signed by Jimmye Norman M.D. on 03/22/2011 08:19:05 AM

## 2011-04-18 NOTE — H&P (Signed)
Riley Jordan, Riley Jordan                 ACCOUNT NO.:  [id number]  MEDICAL RECORD NO.:  [id number]  LOCATION:  3741                         FACILITY:  [facility]  PHYSICIAN:  AVA SWAYZE, DO         DATE OF BIRTH:  12/25/1935  DATE OF ADMISSION:  03/08/2011 DATE OF DISCHARGE:                             HISTORY & PHYSICAL   CHIEF COMPLAINT:  Abdominal pain, nausea, vomiting.  HISTORY OF PRESENT ILLNESS:  The patient is a 75 year old male who states that for the past 6 months or so, he has had intermittent episodes of abdominal discomfort, particularly after fatty meals that kind of go away.  He has not paid too much attention to them.  Today after the patient had visited his wife who is in the hospital, he went and had a lunch.  After lunch, he had an acute episode of epigastric pain and abdominal cramping.  He felt strong urge to have a bowel movement and then he went and he had emesis x2.  He had shaking chills and continued abdominal pain and he came to the emergency department.  PAST MEDICAL HISTORY:  Significant for atrial fibrillation.  He had [place] score of 1, so he is not on Coumadin, hypothyroidism, hypertension, coronary artery disease, status post stenting, diabetes mellitus, and GERD.  PAST SURGICAL HISTORY:  Low back surgery in 1998.  FAMILY MEDICAL HISTORY:  Mother died at age 56 of multiple myeloma. Father died at age 3 of leukemia.  SOCIAL HISTORY:  The patient quit smoking in 1990.  He does not drink. He lives at home with his wife.  REVIEW OF SYSTEMS:  CONSTITUTIONAL:  Positive for shaking chills. Negative for fever.  CNS:  No headaches, no seizures, no specific limb weakness.  ENT:  No nasal congestion, throat pain, or coryza. CARDIOVASCULAR:  No chest pain, no palpitations, and no orthopnea. RESPIRATORY:  No cough, no shortness breath, no wheezing. GASTROINTESTINAL:  Positive for epigastric abdominal pain that radiates down to his lower abdomen.  Positive for  nausea.  Positive for vomiting. Negative for constipation.  Negative for diarrhea.  GENITOURINARY:  No dysuria, no hematuria, and no urinary frequency.  RENAL:  No flank pain. No swelling.  No pruritus.  SKIN:  Positive for jaundice.  Negative for rashes.  Negative for sores.  HEMATOLOGICAL:  No easy bruising, no purpura, no clots.  LYMPHS:  No lymphadenopathy.  No painful nodes.  No specific lymph swelling.  PSYCHIATRIC:  No anxiety, no depression, no insomnia.  PHYSICAL EXAMINATION:  VITAL SIGNS:  Blood pressure upon arrival in the emergency department was 149/98.  At the time I saw the patient, it was 81/42.  Pulse 103, respiratory rate 20, temperature 98.7, O2 sat 97% on room air. GENERAL:  The patient is elderly.  He is awake, alert, and ordered x3, and able to give fair history. HEENT:  Eyes, pupils equal, round, and reactive to light and accommodation.  External ocular movements bilaterally intact.  Sclerae nonicteric, noninjected.  Mouth, oral mucosa moist, no lesions, no sores.  Pharynx clear, no erythema and no exudate. NECK:  Negative for JVD.  Negative for thyromegaly.  Negative for lymphadenopathy.  HEART:  Regular rate and rhythm at 90 beats per minute without murmur, ectopy, or gallops.  No lateral PMI.  No thrills. LUNGS:  Clear to auscultation bilaterally without wheezes, rales, or rhonchi.  No increased work of breathing.  No tactile fremitus. ABDOMEN:  Distended, diffusely tender with severe tenderness in the epigastrium.  Positive for bowel sounds.  No hepatosplenomegaly.  No hernias palpated. CARDIOVASCULAR:  Extremities are negative for cyanosis, clubbing, or edema.  The patient has greatly diminished dorsalis pedis and popliteal pulses bilaterally.  No carotid bruits bilaterally. NEUROLOGIC:  Cranial nerves II-XII grossly intact.  Motor and sensory intact.  LABORATORY DATA:  WBC is 4.1, hemoglobin 13.6, hematocrit 41.4, platelets are 132.  PT is 26.2, INR is  2.39.  Sodium 135, potassium 3.9, chloride 100, CO2 of 21, BUN 15, glucose is 317, creatinine 0.77.  Total bilirubin 3.3, alk phos 326, AST 491, ALT 383, total protein 7.3, albumin 3.9, calcium 9.7, lactic acid 2.5, and lipase is 2420. Urinalysis shows pH is 5, bilirubins negative, ketones are 15.  CT abdomen and pelvis demonstrates acute pancreatitis, multiple gallstones in the gallbladder, and mildly dilated common bile duct, small duodenal diverticulum.  EKG shows normal sinus rhythm.  ASSESSMENT: 1. Sepsis with shaking chills, hypertension, metabolic acidosis,     coagulopathy. 2. Gallstone pancreatitis, severe. 3. Jaundice. 4. Diabetes mellitus, uncontrolled. 5. Coronary artery disease status post stent. 6. Atrial fibrillation with rate controlled. 7. Hypothyroidism.  PLAN: 1. The patient will be admitted to ICU. 2. I have already started an IV fluid bolus on him.  After that, he     will require aggressive IV fluid resuscitation and possibly     pressors. 3. I have given him Zosyn and Flagyl. 4. I have already spoken with PCCM to place a central line and to     monitor the patient should further intervention be required as he     will be placed in the ICU. 5. Blood cultures x2 of course before he received the IV antibiotics     and a GI consult in the morning, although obviously their     willingness to intervene in this patient depends probably on his     clinical status at that time.  I spent 78 minutes on this critical care admission.          ______________________________ Fran Lowes, DO     AS/MEDQ  D:  03/09/2011  T:  03/09/2011  Job:  [id number]  Electronically Signed by Fran Lowes DO on 04/18/2011 01:38:09 PM

## 2011-12-07 ENCOUNTER — Encounter: Payer: Medicare Other | Attending: Endocrinology | Admitting: Dietician

## 2011-12-07 ENCOUNTER — Encounter: Payer: Self-pay | Admitting: Dietician

## 2011-12-07 NOTE — Progress Notes (Unsigned)
  Medical Nutrition Therapy:  Appt start time: {Time; Appointment:21385} end time:  {Time; Appointment:21385}.  Assessment:  Primary concerns today: ***.  Blood Glucose: Checking fasting and (208 Past the meal)  Fasting;140-150 mg.      MD wants 2 times per day. MEDICATIONS: ***  DIETARY INTAKE:  24-hr recall:  B (8:00-8:30 AM): Oatmeal plainpackages, with 2 pk of sweet and low and 2% milk and black coffee. Snk (mid  AM) :none  L (12:30 PM): Spaghetti 2 cups of the spaghetti with the marinaria sauce.with sauce (prago).  Homero Fellers diet cola.  Snk (mid PM): will have black coffee and a little bag of crunchy cheetioes. D (5:30-6:00 PM): Spaghetti noodles 2 and 1 cup of sauce.  Salad with lettuce and onion. Diet cola  Snk (bedtime PM): small bowl cherrios 1-1.5 cup  and 2% milk 1/2 cup. and sweet and low. Beverages: coffee, diet soda  Recent physical activity: Do all the house work, Outside working cutting and splitting wood.  Have a garden in the summer,  Does his yard work.  No planned exercise regimen.      Estimated energy needs: *** calories *** g carbohydrates *** g protein *** g fat  Progress Towards Goal(s):  {Desc; Goals Progress:21388}.   Nutritional Diagnosis:  {CHL AMB NUTRITIONAL DIAGNOSIS:[phone]}    Intervention:  Nutrition ***.  Handouts given during visit include:  ***  ***  Monitoring/Evaluation:  Dietary intake, exercise, ***, and body weight {follow up:15908}.

## 2011-12-07 NOTE — Patient Instructions (Addendum)
   Check your blood glucose in the evening at 9:00 PM on the days that you are checking Riley Jordan  blood glucose.  Ask Dr. Talmage Nap the next time you see her, If she wants you to take your (and Riley Jordan's ) insulin after the meal.   [employer] has a low sodium cookbook or low sodium recipes to try at home.  Riley Jordan needs to send a recipe for altering sodium levels.  If you use canned vegetables, put in water and soak for 20 minutes, then pour the water offf.

## 2012-07-09 ENCOUNTER — Encounter: Payer: Self-pay | Admitting: Cardiology

## 2012-07-09 ENCOUNTER — Other Ambulatory Visit: Payer: Self-pay | Admitting: Cardiology

## 2012-07-10 ENCOUNTER — Inpatient Hospital Stay (HOSPITAL_BASED_OUTPATIENT_CLINIC_OR_DEPARTMENT_OTHER)
Admission: RE | Admit: 2012-07-10 | Discharge: 2012-07-10 | Disposition: A | Discharge status: Home / Self Care | Payer: Medicare Other | Source: Ambulatory Visit | Attending: Cardiology | Admitting: Cardiology

## 2012-07-10 ENCOUNTER — Encounter (HOSPITAL_BASED_OUTPATIENT_CLINIC_OR_DEPARTMENT_OTHER)
Admission: RE | Disposition: A | Discharge status: Home / Self Care | Payer: Self-pay | Source: Ambulatory Visit | Attending: Cardiology

## 2012-07-10 ENCOUNTER — Other Ambulatory Visit: Payer: Self-pay | Admitting: Interventional Cardiology

## 2012-07-10 DIAGNOSIS — I2510 Atherosclerotic heart disease of native coronary artery without angina pectoris: Secondary | ICD-10-CM | POA: Insufficient documentation

## 2012-07-10 DIAGNOSIS — Y840 Cardiac catheterization as the cause of abnormal reaction of the patient, or of later complication, without mention of misadventure at the time of the procedure: Secondary | ICD-10-CM | POA: Insufficient documentation

## 2012-07-10 DIAGNOSIS — Z9861 Coronary angioplasty status: Secondary | ICD-10-CM | POA: Insufficient documentation

## 2012-07-10 LAB — POCT I-STAT GLUCOSE: Glucose, Bld: 270 mg/dL — ABNORMAL HIGH (ref 70–99)

## 2012-07-10 SURGERY — JV LEFT HEART CATHETERIZATION WITH CORONARY ANGIOGRAM
Anesthesia: Moderate Sedation

## 2012-07-10 MED ORDER — CLOPIDOGREL BISULFATE 300 MG PO TABS
300.0000 mg | ORAL_TABLET | Freq: Once | ORAL | Status: AC
  Administered 2012-07-10: 300 mg via ORAL

## 2012-07-10 MED ORDER — ONDANSETRON HCL 4 MG/2ML IJ SOLN: 4.0000 mg | Freq: Four times a day (QID) | INTRAMUSCULAR | Status: DC | PRN

## 2012-07-10 MED ORDER — ACETAMINOPHEN 325 MG PO TABS
650.0000 mg | ORAL_TABLET | ORAL | Status: DC | PRN
  Administered 2012-07-10: 650 mg via ORAL

## 2012-07-10 MED ORDER — SODIUM CHLORIDE 0.9 % IV SOLN: 1.0000 mL/kg/h | INTRAVENOUS | Status: DC

## 2012-07-10 MED ORDER — CLOPIDOGREL BISULFATE 75 MG PO TABS: 75.0000 mg | ORAL_TABLET | Freq: Every day | ORAL | Status: DC

## 2012-07-10 MED ORDER — SODIUM CHLORIDE 0.9 % IV SOLN
INTRAVENOUS | Status: DC
  Administered 2012-07-10: 09:00:00 via INTRAVENOUS

## 2012-07-10 NOTE — CV Procedure (Addendum)
PROCEDURE:  Left heart catheterization with selective coronary angiography, left ventriculogram.  INDICATIONS:    The risks, benefits, and details of the procedure were explained to the patient.  The patient verbalized understanding and wanted to proceed.  Informed written consent was obtained.  PROCEDURE TECHNIQUE:  After Xylocaine anesthesia a 38F sheath was placed in the right femoral artery with a single anterior needle wall stick.   Left coronary angiography was done using a Judkins L4 guide catheter.  Right coronary angiography was done using a Judkins R4 guide catheter.  Left ventriculography was done using a pigtail catheter.    CONTRAST:  Total of 75 cc.  COMPLICATIONS:  None.    HEMODYNAMICS:  Aortic pressure was 124/17mmHg; LV pressure was 124/10mmHg; LVEDP .  There was no gradient between the left ventricle and aorta.    ANGIOGRAPHIC DATA:   The left main coronary artery is short but widely patent.  It bifurcates into an LAD and left circumflex artery.    The left anterior descending artery has a 40% narrowing proximal to the stent.  There is diffuse disease within the LAD stent with a 90% in-stent restenosis in the distal portion of the stent.    The ongoing LAD is patent and distally has 70% stenosis.  The proximal LAD at the proximal portion of the stent gives rise to a small diagonal which is patent.  It gives rise to a second diagonal in the mid to distal portion of the LAD and is patent.  The left circumflex artery is patent in the proximal portion with some luminal irreglarities up to 20%.  The stent in the proximal left circumflex is patent with 20% proximal in-stent restenosis.  The ongoing left circumflex gives rise to a large OM1.  At the bifurcation of the OM there is a bifurcation lesion in the left circ and OM that is 80% and involves the OM.  The OM is large and bifurcates into 2 daughter vessels both of which are patent.    The right coronary artery is widely  patent with luminal irregularities.  LEFT VENTRICULOGRAM:  Left ventricular angiogram was done in the 30 RAO projection and revealed normal left ventricular wall motion and systolic function with an estimated ejection fraction of 55%.  LVEDP was 16 mmHg.  IMPRESSIONS:  1. Normal left main coronary artery. 2. 90% in-stent restenosis of the proximal to mid LAD at the distal portio of the stent but diffuse disease within the entire stent. 3. Patent proximal left circumflex artery stent with 80% bifurcation lesion in mid left circ at takeoff of large OM branch 4. Normal right coronary artery with luminal irregularities. 5. Normal left ventricular systolic function.  LVEDP 16 mmHg.  Ejection fraction 55%.  RECOMMENDATION:   1.  Films reviewed with Dr. Eldridge Dace.  The patient has not had any rest angina and only exertional angina.  He has a small hematoma at the cath site so we will discharge him home today on Plavix and ASA and plan PCI of LAD on Thursday by Dr. Eldridge Dace.  Will then perform staged PCI on left circumflex as well at time determined by Dr. Eldridge Dace.   2.  Plavix 300mg  now then 75mg  daily 3.  Aspirin 81mg  daily 4.  Will have Orthopedics evaluate his left leg that he apparently injured 6 weeks ago and was evaluated by his primary MD Dr. Jeannetta Nap but no xrays done and now is very swollen on the anterior aspect of his tibia. 5.  Hold  Metformin until after procedure on Thursday

## 2012-07-10 NOTE — Progress Notes (Signed)
Bedrest begins @ 1125.  Hematoma level 1. Bruising marked with ink pen.  Tegaderm and pressure dressing applied by Haskel Khan RN.

## 2012-07-10 NOTE — Addendum Note (Signed)
Addended by: Armanda Magic on: 07/10/2012 10:58 AM   Modules accepted: Orders

## 2012-07-10 NOTE — Progress Notes (Signed)
Dr. Magnus Ivan in to aspirate hematoma from left tibia site.  Bandaid applied to site.

## 2012-07-11 ENCOUNTER — Encounter (HOSPITAL_BASED_OUTPATIENT_CLINIC_OR_DEPARTMENT_OTHER): Payer: Self-pay

## 2012-07-11 ENCOUNTER — Encounter (HOSPITAL_COMMUNITY): Payer: Self-pay | Admitting: Pharmacy Technician

## 2012-07-12 ENCOUNTER — Encounter (HOSPITAL_COMMUNITY)
Admission: RE | Disposition: A | Discharge status: Home / Self Care | Payer: Self-pay | Source: Ambulatory Visit | Attending: Interventional Cardiology

## 2012-07-12 ENCOUNTER — Ambulatory Visit (HOSPITAL_COMMUNITY)
Admission: RE | Admit: 2012-07-12 | Discharge: 2012-07-13 | Disposition: A | Discharge status: Home / Self Care | Payer: Medicare Other | Source: Ambulatory Visit | Attending: Interventional Cardiology | Admitting: Interventional Cardiology

## 2012-07-12 ENCOUNTER — Encounter (HOSPITAL_COMMUNITY): Payer: Self-pay | Admitting: General Practice

## 2012-07-12 DIAGNOSIS — I209 Angina pectoris, unspecified: Secondary | ICD-10-CM | POA: Insufficient documentation

## 2012-07-12 DIAGNOSIS — Z955 Presence of coronary angioplasty implant and graft: Secondary | ICD-10-CM

## 2012-07-12 DIAGNOSIS — I2510 Atherosclerotic heart disease of native coronary artery without angina pectoris: Secondary | ICD-10-CM | POA: Insufficient documentation

## 2012-07-12 DIAGNOSIS — Y840 Cardiac catheterization as the cause of abnormal reaction of the patient, or of later complication, without mention of misadventure at the time of the procedure: Secondary | ICD-10-CM | POA: Insufficient documentation

## 2012-07-12 DIAGNOSIS — IMO0002 Reserved for concepts with insufficient information to code with codable children: Secondary | ICD-10-CM | POA: Insufficient documentation

## 2012-07-12 DIAGNOSIS — Y9210 Unspecified residential institution as the place of occurrence of the external cause: Secondary | ICD-10-CM | POA: Insufficient documentation

## 2012-07-12 HISTORY — DX: Type 2 diabetes mellitus without complications: E11.9

## 2012-07-12 HISTORY — DX: Unspecified osteoarthritis, unspecified site: M19.90

## 2012-07-12 HISTORY — PX: PERCUTANEOUS CORONARY STENT INTERVENTION (PCI-S): SHX5485

## 2012-07-12 HISTORY — DX: Pure hypercholesterolemia, unspecified: E78.00

## 2012-07-12 HISTORY — DX: Major depressive disorder, single episode, unspecified: F32.9

## 2012-07-12 HISTORY — DX: Type 2 diabetes mellitus with diabetic peripheral angiopathy without gangrene: E11.51

## 2012-07-12 HISTORY — DX: Personal history of other medical treatment: Z92.89

## 2012-07-12 HISTORY — DX: Depression, unspecified: F32.A

## 2012-07-12 LAB — BASIC METABOLIC PANEL
BUN: 14 mg/dL (ref 6–23)
CO2: 24 mEq/L (ref 19–32)
Chloride: 99 mEq/L (ref 96–112)
Glucose, Bld: 462 mg/dL — ABNORMAL HIGH (ref 70–99)
Potassium: 4.6 mEq/L (ref 3.5–5.1)

## 2012-07-12 LAB — GLUCOSE, CAPILLARY
Glucose-Capillary: 311 mg/dL — ABNORMAL HIGH (ref 70–99)
Glucose-Capillary: 326 mg/dL — ABNORMAL HIGH (ref 70–99)
Glucose-Capillary: 301 mg/dL — ABNORMAL HIGH (ref 70–99)
Glucose-Capillary: 282 mg/dL — ABNORMAL HIGH (ref 70–99)

## 2012-07-12 LAB — CBC
HCT: 37.4 % — ABNORMAL LOW (ref 39.0–52.0)
Hemoglobin: 11.5 g/dL — ABNORMAL LOW (ref 13.0–17.0)
WBC: 7 10*3/uL (ref 4.0–10.5)

## 2012-07-12 LAB — PROTIME-INR: INR: 0.97 (ref 0.00–1.49)

## 2012-07-12 SURGERY — PERCUTANEOUS CORONARY STENT INTERVENTION (PCI-S)
Anesthesia: LOCAL

## 2012-07-12 MED ORDER — SODIUM CHLORIDE 0.9 % IV SOLN: 1.0000 mL/kg/h | INTRAVENOUS | Status: AC

## 2012-07-12 MED ORDER — BIVALIRUDIN 250 MG IV SOLR
INTRAVENOUS | Status: AC
  Filled 2012-07-12: qty 250

## 2012-07-12 MED ORDER — ACETAMINOPHEN 325 MG PO TABS
650.0000 mg | ORAL_TABLET | ORAL | Status: DC | PRN
  Administered 2012-07-12: 650 mg via ORAL

## 2012-07-12 MED ORDER — ONDANSETRON HCL 4 MG/2ML IJ SOLN
INTRAMUSCULAR | Status: AC
  Administered 2012-07-12: 4 mg via INTRAVENOUS
  Filled 2012-07-12: qty 2

## 2012-07-12 MED ORDER — INSULIN ASPART 100 UNIT/ML ~~LOC~~ SOLN: 0.0000 [IU] | Freq: Three times a day (TID) | SUBCUTANEOUS | Status: DC

## 2012-07-12 MED ORDER — MECLIZINE HCL 25 MG PO TABS
25.0000 mg | ORAL_TABLET | Freq: Three times a day (TID) | ORAL | Status: DC | PRN
  Filled 2012-07-12: qty 1

## 2012-07-12 MED ORDER — ASPIRIN 81 MG PO CHEW
324.0000 mg | CHEWABLE_TABLET | ORAL | Status: AC
  Administered 2012-07-12: 234 mg via ORAL
  Filled 2012-07-12: qty 3

## 2012-07-12 MED ORDER — CLOPIDOGREL BISULFATE 75 MG PO TABS
75.0000 mg | ORAL_TABLET | Freq: Every day | ORAL | Status: DC
  Administered 2012-07-13: 75 mg via ORAL
  Filled 2012-07-12: qty 1

## 2012-07-12 MED ORDER — SODIUM CHLORIDE 0.9 % IV SOLN
0.2500 mg/kg/h | INTRAVENOUS | Status: DC
  Filled 2012-07-12: qty 250

## 2012-07-12 MED ORDER — INSULIN NPH ISOPHANE & REGULAR (70-30) 100 UNIT/ML ~~LOC~~ SUSP: 35.0000 [IU] | Freq: Two times a day (BID) | SUBCUTANEOUS | Status: DC

## 2012-07-12 MED ORDER — SODIUM CHLORIDE 0.9 % IJ SOLN: 3.0000 mL | Freq: Two times a day (BID) | INTRAMUSCULAR | Status: DC

## 2012-07-12 MED ORDER — AMIODARONE HCL 200 MG PO TABS
200.0000 mg | ORAL_TABLET | Freq: Every day | ORAL | Status: DC
  Administered 2012-07-12: 18:00:00 200 mg via ORAL
  Filled 2012-07-12 (×2): qty 1

## 2012-07-12 MED ORDER — BIVALIRUDIN 250 MG IV SOLR
0.2500 mg/kg/h | INTRAVENOUS | Status: DC
  Filled 2012-07-12: qty 250

## 2012-07-12 MED ORDER — FENTANYL CITRATE 0.05 MG/ML IJ SOLN
INTRAMUSCULAR | Status: AC
  Filled 2012-07-12: qty 2

## 2012-07-12 MED ORDER — INSULIN ASPART PROT & ASPART (70-30 MIX) 100 UNIT/ML ~~LOC~~ SUSP
35.0000 [IU] | Freq: Every day | SUBCUTANEOUS | Status: DC
  Administered 2012-07-12: 18:00:00 35 [IU] via SUBCUTANEOUS
  Filled 2012-07-12: qty 3

## 2012-07-12 MED ORDER — ACETAMINOPHEN 325 MG PO TABS
ORAL_TABLET | ORAL | Status: AC
  Filled 2012-07-12: qty 2

## 2012-07-12 MED ORDER — MORPHINE SULFATE 2 MG/ML IJ SOLN: 1.0000 mg | INTRAMUSCULAR | Status: DC | PRN

## 2012-07-12 MED ORDER — ONDANSETRON HCL 4 MG/2ML IJ SOLN
4.0000 mg | Freq: Four times a day (QID) | INTRAMUSCULAR | Status: DC | PRN
  Administered 2012-07-12: 4 mg via INTRAVENOUS

## 2012-07-12 MED ORDER — HYDRALAZINE HCL 20 MG/ML IJ SOLN
INTRAMUSCULAR | Status: AC
  Filled 2012-07-12: qty 1

## 2012-07-12 MED ORDER — PANTOPRAZOLE SODIUM 40 MG PO TBEC
40.0000 mg | DELAYED_RELEASE_TABLET | Freq: Every day | ORAL | Status: DC
  Administered 2012-07-13: 40 mg via ORAL
  Filled 2012-07-12: qty 1

## 2012-07-12 MED ORDER — SODIUM CHLORIDE 0.9 % IV SOLN: 250.0000 mL | INTRAVENOUS | Status: DC | PRN

## 2012-07-12 MED ORDER — ONDANSETRON HCL 4 MG/2ML IJ SOLN: 4.0000 mg | Freq: Four times a day (QID) | INTRAMUSCULAR | Status: DC | PRN

## 2012-07-12 MED ORDER — SODIUM CHLORIDE 0.9 % IV SOLN: 0.2500 mg/kg/h | INTRAVENOUS | Status: DC

## 2012-07-12 MED ORDER — INSULIN ASPART 100 UNIT/ML ~~LOC~~ SOLN: 0.0000 [IU] | Freq: Every day | SUBCUTANEOUS | Status: DC

## 2012-07-12 MED ORDER — ASPIRIN EC 81 MG PO TBEC
81.0000 mg | DELAYED_RELEASE_TABLET | Freq: Every day | ORAL | Status: DC
  Administered 2012-07-13: 10:00:00 81 mg via ORAL
  Filled 2012-07-12: qty 1

## 2012-07-12 MED ORDER — CLOPIDOGREL BISULFATE 300 MG PO TABS
ORAL_TABLET | ORAL | Status: AC
  Filled 2012-07-12: qty 1

## 2012-07-12 MED ORDER — LEVOTHYROXINE SODIUM 150 MCG PO TABS
150.0000 ug | ORAL_TABLET | Freq: Every day | ORAL | Status: DC
  Administered 2012-07-13: 10:00:00 150 ug via ORAL
  Filled 2012-07-12 (×2): qty 1

## 2012-07-12 MED ORDER — DIAZEPAM 5 MG PO TABS
5.0000 mg | ORAL_TABLET | ORAL | Status: AC
  Administered 2012-07-12: 5 mg via ORAL
  Filled 2012-07-12: qty 1

## 2012-07-12 MED ORDER — HEPARIN (PORCINE) IN NACL 2-0.9 UNIT/ML-% IJ SOLN
INTRAMUSCULAR | Status: AC
  Filled 2012-07-12: qty 1000

## 2012-07-12 MED ORDER — SODIUM CHLORIDE 0.9 % IV SOLN
INTRAVENOUS | Status: DC
  Administered 2012-07-12: 1000 mL via INTRAVENOUS

## 2012-07-12 MED ORDER — VERAPAMIL HCL 2.5 MG/ML IV SOLN
INTRAVENOUS | Status: AC
  Filled 2012-07-12: qty 2

## 2012-07-12 MED ORDER — ATORVASTATIN CALCIUM 10 MG PO TABS
10.0000 mg | ORAL_TABLET | Freq: Every day | ORAL | Status: DC
  Administered 2012-07-12: 10 mg via ORAL
  Filled 2012-07-12 (×2): qty 1

## 2012-07-12 MED ORDER — ISOSORBIDE MONONITRATE ER 60 MG PO TB24
90.0000 mg | ORAL_TABLET | Freq: Every day | ORAL | Status: DC
  Administered 2012-07-12 – 2012-07-13 (×2): 90 mg via ORAL
  Filled 2012-07-12 (×2): qty 1

## 2012-07-12 MED ORDER — ASPIRIN 81 MG PO TABS: 81.0000 mg | ORAL_TABLET | Freq: Every day | ORAL | Status: DC

## 2012-07-12 MED ORDER — INSULIN ASPART 100 UNIT/ML ~~LOC~~ SOLN
0.0000 [IU] | Freq: Three times a day (TID) | SUBCUTANEOUS | Status: DC
  Administered 2012-07-12: 7 [IU] via SUBCUTANEOUS
  Administered 2012-07-12: 9 [IU] via SUBCUTANEOUS
  Administered 2012-07-12: 7 [IU] via SUBCUTANEOUS
  Administered 2012-07-13: 10:00:00 5 [IU] via SUBCUTANEOUS

## 2012-07-12 MED ORDER — NITROGLYCERIN 0.2 MG/ML ON CALL CATH LAB
INTRAVENOUS | Status: AC
  Filled 2012-07-12: qty 1

## 2012-07-12 MED ORDER — MIDAZOLAM HCL 2 MG/2ML IJ SOLN
INTRAMUSCULAR | Status: AC
  Filled 2012-07-12: qty 2

## 2012-07-12 MED ORDER — INSULIN ASPART PROT & ASPART (70-30 MIX) 100 UNIT/ML ~~LOC~~ SUSP
50.0000 [IU] | Freq: Every day | SUBCUTANEOUS | Status: DC
  Administered 2012-07-13: 50 [IU] via SUBCUTANEOUS
  Filled 2012-07-12: qty 3

## 2012-07-12 MED ORDER — SODIUM CHLORIDE 0.9 % IJ SOLN: 3.0000 mL | INTRAMUSCULAR | Status: DC | PRN

## 2012-07-12 MED ORDER — LIDOCAINE HCL (PF) 1 % IJ SOLN
INTRAMUSCULAR | Status: AC
  Filled 2012-07-12: qty 30

## 2012-07-12 MED ORDER — EZETIMIBE 10 MG PO TABS
10.0000 mg | ORAL_TABLET | Freq: Every day | ORAL | Status: DC
  Administered 2012-07-12 – 2012-07-13 (×2): 10 mg via ORAL
  Filled 2012-07-12 (×2): qty 1

## 2012-07-12 NOTE — CV Procedure (Signed)
PROCEDURE:  PCI LAD, PCI Left circumflex  INDICATIONS:  Class 3 angina.    The risks, benefits, and details of the procedure were explained to the patient.  The patient verbalized understanding and wanted to proceed.  Informed written consent was obtained.  PROCEDURE TECHNIQUE:  After Xylocaine anesthesia a 62F sheath was placed in the right radial artery with a single anterior needle wall stick.   Left coronary angiography was done using a CLS 3.0  guide catheter.    CONTRAST:  Total of 110 cc.  COMPLICATIONS:  None.       ANGIOGRAPHIC DATA:   The left main coronary artery had mild ostial disease.  The left anterior descending artery is a large vessel.  There is severe 90% in-stent restenosis at the distal edge of the prior stent.  Proximal to the stent in the proximal LAD, there is a 60% stenosis.  The main body of the stent has diffuse in-stent restenosis up to 50%.  There is moderate disease in the distal LAD.  The left circumflex artery is a large vessel.  The proximal stent has mild in-stent restenosis.  At the mid circumflex, after tortuosity and at the origin of the large OM1, there is a focal 80% stenosis.    PCI NARRATIVE:   A CLS 3.0 guiding catheter was used to engage the left main.  A [place] XT wire was used to cross the disease in the LAD.  A 2.5 x 10 cutting balloon was advanced but would not cross the proximal area of disease.  A 2.5 x 15 [place] trek balloon was then used to predilate the area.  The same cutting balloon was then advanced and successfully used to treat the entire diseased area with several inflations to 12 atmospheres.  A 3.0 x 38 Promus drug-eluting stent was then deployed at 14 atmospheres.  The stent was postdilated with a 3.5 x 20 [place] trek balloon inflated to 18 atmospheres.  There is no residual stenosis.  There is an excellent angiographic result.  TIMI-3 flow was maintained throughout.  The patient did have significant jaw pain with balloon inflations.  The  same [facility] wire was then redirected down the circumflex.  A 2.5 x 12 balloon was used to predilate the discrete lesion in the midcircumflex.  A 2.75 x 12 Promus drug-eluting stent was then deployed.  The stent was postdilated with a 3.0 x 8 [place] trek balloon inflated to 16 atmospheres.  There was an excellent angiographic result after intracoronary nitroglycerin was administered.  There is some wire bias due to the tortuosity.  Also, there is some vasospasm in the distal vessel where the wire was.  This was resolved with nitroglycerin.   IMPRESSIONS:  1. Successful drug-eluting stent to the proximal to mid LAD with a 3.0 x 38, struggling stent, postdilated to 3.6 mm in diameter. 2. Successful drug-eluting stent to the mid circumflex with a 2.75 x 12 Promus drug-eluting stent post dilated to 3.0 mm in diameter.  RECOMMENDATION:  The patient would normally need dual antiplatelet therapy.  Since he'll be starting Coumadin, this patient may end up taking Plavix with Coumadin and holding aspirin.  Will discuss this with Dr. Mayford Knife.  Continue aggressive secondary prevention.  The patient will be watched overnight.  Hopefully, we'll be of discharge the patient tomorrow if there are no complications.

## 2012-07-12 NOTE — Progress Notes (Signed)
TR BAND REMOVAL  LOCATION:    right radial  DEFLATED PER PROTOCOL:    yes  TIME BAND OFF / DRESSING APPLIED:    1600   SITE UPON ARRIVAL:    Level 0  SITE AFTER BAND REMOVAL:    Level 0  REVERSE ALLEN'S TEST:     positive  CIRCULATION SENSATION AND MOVEMENT:    Within Normal Limits   yes  COMMENTS:   Tolerated procedure well 

## 2012-07-13 LAB — CBC
HCT: 33.3 % — ABNORMAL LOW (ref 39.0–52.0)
Hemoglobin: 10.1 g/dL — ABNORMAL LOW (ref 13.0–17.0)
MCH: 22.1 pg — ABNORMAL LOW (ref 26.0–34.0)
MCHC: 30.3 g/dL (ref 30.0–36.0)
MCV: 72.9 fL — ABNORMAL LOW (ref 78.0–100.0)
Platelets: 204 10*3/uL (ref 150–400)
RBC: 4.57 MIL/uL (ref 4.22–5.81)
RDW: 16.9 % — ABNORMAL HIGH (ref 11.5–15.5)
WBC: 7.6 10*3/uL (ref 4.0–10.5)

## 2012-07-13 LAB — BASIC METABOLIC PANEL
CO2: 26 mEq/L (ref 19–32)
Chloride: 101 mEq/L (ref 96–112)
Creatinine, Ser: 0.86 mg/dL (ref 0.50–1.35)
GFR calc Af Amer: >90 mL/min (ref >90)
Potassium: 4.2 mEq/L (ref 3.5–5.1)
Sodium: 136 mEq/L (ref 135–145)

## 2012-07-13 MED ORDER — PANTOPRAZOLE SODIUM 40 MG PO TBEC: 40.0000 mg | DELAYED_RELEASE_TABLET | Freq: Every day | ORAL | Status: DC

## 2012-07-13 MED FILL — Dextrose Inj 5%: INTRAVENOUS | Qty: 50 | Status: AC

## 2012-07-13 NOTE — Progress Notes (Signed)
CARDIAC REHAB PHASE I   PRE:  Rate/Rhythm: 80 SR    BP: sitting 147/77    SaO2:   MODE:  Ambulation: 500 ft   POST:  Rate/Rhythm: 96 SR    BP: sitting 150/68     SaO2:   Slow pace. Sts he has some neuropathy. Steady without c/o. Ed completed. Needs work on diet and ex, which seems difficult for him since his wife died. Highly encouraged CRPII and walking daily. Requests his name be sent to [facility].  [id number]  Harriet Masson CES, ACSM

## 2012-07-13 NOTE — Discharge Summary (Signed)
Patient ID: Riley Jordan MRN: [id number] DOB/AGE: Sep 15, 1936 76 y.o.  Admit date: 07/12/2012 Discharge date: 07/13/2012  Primary Discharge DiagnosisCAD Secondary Discharge DiagnosisAngina, PAF, right groin hematoma from prior diagnostic cath  Significant Diagnostic Studies: angiography: cardiac cath with DES to prox-mid LAD, 3.0 x 38 Promus postdilated to 3.6 mm.  DES to mid circumflex with 2.75 x 12, posdilated to 3.1 mm.  Consults: None  Hospital Course: 76 y/o who had a diagnostic cath a few days earlier by Dr. Mayford Knife.  He had a right groin hematoma and a left leg injury which was evaluated by orthopedics.  No surgery was needed.  He had no elective surgery planned and had a h/o restenosis.  Despite PAF and the need  For coumadin, it was thought that DES would be the best option.  Cath was done from right radial approach with the above results.  He tolerated the procedure well.  He walked with cardiac rehab.  No problems post procedure.    The patient may be able to come off of his Imdur.  He will called about when to start his coumadin.  I will discuss with Dr. Mayford Knife.  At that time, he will stop aspirin and take only coumadin and plavix.  PPI was changed due to starting Plavix.   Discharge Exam: Blood pressure 128/68, pulse 71, temperature 97.7 F (36.5 C), temperature source Oral, resp. rate 20, height 6' (1.829 m), weight 95.7 kg (210 lb 15.7 oz), SpO2 95.00%. [place]/AT RRR,S1S2 CTA bilaterally 2+ right radial pulse Right groin heavily bruised. Left calf bruised Labs:   Lab Results  Component Value Date   WBC 7.6 07/13/2012   HGB 10.1* 07/13/2012   HCT 33.3* 07/13/2012   MCV 72.9* 07/13/2012   PLT 204 07/13/2012    Lab 07/13/12 0610  NA 136  K 4.2  CL 101  CO2 26  BUN 11  CREATININE 0.86  CALCIUM 9.4  PROT --  BILITOT --  ALKPHOS --  ALT --  AST --  GLUCOSE 287*   Lab Results  Component Value Date   CKTOTAL 55 08/21/2010   CKMB 2.1 08/21/2010   TROPONINI   Value: 0.04        NO INDICATION OF MYOCARDIAL INJURY. 08/21/2010    Lab Results  Component Value Date   CHOL 114 03/09/2011   CHOL  Value: 138        ATP III CLASSIFICATION:  <200     mg/dL   Desirable  829-562  mg/dL   Borderline High  >=130    mg/dL   High        86/57/8469   Lab Results  Component Value Date   HDL 31* 03/09/2011   HDL 22* 08/24/2010   Lab Results  Component Value Date   LDLCALC  Value: 63        Total Cholesterol/HDL:CHD Risk Coronary Heart Disease Risk Table                     Men   Women  1/2 Average Risk   3.4   3.3  Average Risk       5.0   4.4  2 X Average Risk   9.6   7.1  3 X Average Risk  23.4   11.0        Use the calculated Patient Ratio above and the CHD Risk Table to determine the patient's CHD Risk.        ATP III CLASSIFICATION (  LDL):  <100     mg/dL   Optimal  161-096  mg/dL   Near or Above                    Optimal  130-159  mg/dL   Borderline  045-409  mg/dL   High  >811     mg/dL   Very High 06/03/4781   LDLCALC  Value: 80        Total Cholesterol/HDL:CHD Risk Coronary Heart Disease Risk Table                     Men   Women  1/2 Average Risk   3.4   3.3  Average Risk       5.0   4.4  2 X Average Risk   9.6   7.1  3 X Average Risk  23.4   11.0        Use the calculated Patient Ratio above and the CHD Risk Table to determine the patient's CHD Risk.        ATP III CLASSIFICATION (LDL):  <100     mg/dL   Optimal  956-213  mg/dL   Near or Above                    Optimal  130-159  mg/dL   Borderline  086-578  mg/dL   High  >469     mg/dL   Very High 62/95/2841   Lab Results  Component Value Date   TRIG 102 03/09/2011   TRIG 179* 08/24/2010   Lab Results  Component Value Date   CHOLHDL 3.7 03/09/2011   CHOLHDL 6.3 08/24/2010   No results found for this basename: LDLDIRECT       EKG: NSR, no ST segment changes  FOLLOW UP PLANS AND APPOINTMENTS    Medication List     As of 07/13/2012  9:10 AM    STOP taking these medications         omeprazole 20 MG  capsule   Commonly known as: PRILOSEC   Replaced by: pantoprazole 40 MG tablet      TAKE these medications         amiodarone 200 MG tablet   Commonly known as: PACERONE   Take 200 mg by mouth daily.      aspirin 81 MG tablet   Take 81 mg by mouth daily.      atorvastatin 10 MG tablet   Commonly known as: LIPITOR   Take 10 mg by mouth daily.      clopidogrel 75 MG tablet   Commonly known as: PLAVIX   Take 1 tablet (75 mg total) by mouth daily.      ezetimibe 10 MG tablet   Commonly known as: ZETIA   Take 10 mg by mouth daily.      insulin NPH-insulin regular (70-30) 100 UNIT/ML injection   Commonly known as: NOVOLIN 70/30   Inject 35-50 Units into the skin 2 (two) times daily with a meal. 50 units qAM and 35 units  qPM      isosorbide mononitrate 60 MG 24 hr tablet   Commonly known as: IMDUR   Take 90 mg by mouth daily.      levothyroxine 150 MCG tablet   Commonly known as: SYNTHROID, LEVOTHROID   Take 150 mcg by mouth daily.      meclizine 25 MG tablet   Commonly known as: ANTIVERT   Take  25 mg by mouth 3 (three) times daily as needed.      pantoprazole 40 MG tablet   Commonly known as: PROTONIX   Take 1 tablet (40 mg total) by mouth daily at 6 (six) AM.           Follow-up Information    Follow up with Quintella Reichert, MD. On 07/19/2012. (1:30 PM)    Contact information:   [address] [place] [address] [phone]          BRING ALL MEDICATIONS WITH YOU TO FOLLOW UP APPOINTMENTS  Time spent with patient to include physician time: 20 minutes Signed: Deanette Tullius S. 07/13/2012, 9:10 AM

## 2012-07-13 NOTE — H&P (Signed)
Date of Initial H&P: 06/25/12  History reviewed, patient examined, no change in status, stable for surgery.

## 2012-08-23 ENCOUNTER — Other Ambulatory Visit (HOSPITAL_COMMUNITY): Payer: Self-pay | Admitting: Cardiology

## 2012-08-23 ENCOUNTER — Encounter (HOSPITAL_COMMUNITY)
Admission: RE | Admit: 2012-08-23 | Discharge: 2012-08-23 | Disposition: A | Discharge status: Home / Self Care | Payer: Medicare Other | Source: Ambulatory Visit | Attending: Cardiology | Admitting: Cardiology

## 2012-08-23 DIAGNOSIS — Z5189 Encounter for other specified aftercare: Secondary | ICD-10-CM | POA: Insufficient documentation

## 2012-08-23 DIAGNOSIS — I2510 Atherosclerotic heart disease of native coronary artery without angina pectoris: Secondary | ICD-10-CM | POA: Insufficient documentation

## 2012-08-23 DIAGNOSIS — I4891 Unspecified atrial fibrillation: Secondary | ICD-10-CM

## 2012-08-23 DIAGNOSIS — I209 Angina pectoris, unspecified: Secondary | ICD-10-CM | POA: Insufficient documentation

## 2012-08-23 DIAGNOSIS — Z9861 Coronary angioplasty status: Secondary | ICD-10-CM | POA: Insufficient documentation

## 2012-08-23 NOTE — Progress Notes (Signed)
Cardiac Rehab Medication Review by a Pharmacist  Does the patient  feel that his/her medications are working for him/her?  yes  Has the patient been experiencing any side effects to the medications prescribed?  no  Does the patient measure his/her own blood pressure or blood glucose at home?  no , has cuff if needed  Does the patient have any problems obtaining medications due to transportation or finances?   no  Understanding of regimen: good Understanding of indications: good Potential of compliance: good  Pharmacist comments: The medication list was reviewed with the patient for accuracy, indications, adverse effects, and compliance. New medications were added to the list. Any questions that the patient had were addressed at this time.  Abran Duke, PharmD Clinical Pharmacist Phone: [phone] Pager: [phone] 08/23/2012 8:20 AM

## 2012-08-27 ENCOUNTER — Encounter (HOSPITAL_COMMUNITY): Payer: Self-pay

## 2012-08-27 ENCOUNTER — Ambulatory Visit (HOSPITAL_COMMUNITY)
Admission: RE | Admit: 2012-08-27 | Discharge: 2012-08-27 | Disposition: A | Discharge status: Home / Self Care | Payer: Medicare Other | Source: Ambulatory Visit | Attending: Cardiology | Admitting: Cardiology

## 2012-08-27 ENCOUNTER — Encounter (HOSPITAL_COMMUNITY)
Admission: RE | Admit: 2012-08-27 | Discharge: 2012-08-27 | Disposition: A | Discharge status: Home / Self Care | Payer: Medicare Other | Source: Ambulatory Visit | Attending: Cardiology | Admitting: Cardiology

## 2012-08-27 DIAGNOSIS — I4891 Unspecified atrial fibrillation: Secondary | ICD-10-CM | POA: Insufficient documentation

## 2012-08-27 DIAGNOSIS — Z79899 Other long term (current) drug therapy: Secondary | ICD-10-CM | POA: Insufficient documentation

## 2012-08-27 LAB — GLUCOSE, CAPILLARY
Glucose-Capillary: 85 mg/dL (ref 70–99)
Glucose-Capillary: 95 mg/dL (ref 70–99)

## 2012-08-27 NOTE — Progress Notes (Signed)
Pt started cardiac rehab today.  Pt tolerated light exercise without difficulty.  Asymptomatic, VSS, telemetry-NSR.  Pt oriented to exercise equipment and routine.  Understanding verbalized.   

## 2012-08-29 ENCOUNTER — Encounter (HOSPITAL_COMMUNITY)
Admission: RE | Admit: 2012-08-29 | Discharge: 2012-08-29 | Disposition: A | Discharge status: Home / Self Care | Payer: Medicare Other | Source: Ambulatory Visit | Attending: Cardiology | Admitting: Cardiology

## 2012-08-29 LAB — GLUCOSE, CAPILLARY: Glucose-Capillary: 322 mg/dL — ABNORMAL HIGH (ref 70–99)

## 2012-08-29 NOTE — Progress Notes (Signed)
Pt CBG-322 upon arrival to cardiac rehab today.  Pt did not exercise, urine negative for ketones.  Pt advised to increase PO water intake today and choose meals carefully to avoid sugar intake.   Pt reports he was awoken at 230am with CBG-50.  Pt treated this with peanut butter crackers, pt did not repeat his CBG, ate usual breakfast and took insulin as prescribed.  Pt denies s/s of infection.  Pt will monitor CBG at home and notify MD if continues to be >300 and/or s/s of infection develop.

## 2012-08-31 ENCOUNTER — Encounter (HOSPITAL_COMMUNITY): Payer: Medicare Other

## 2012-09-03 ENCOUNTER — Encounter (HOSPITAL_COMMUNITY)
Admission: RE | Admit: 2012-09-03 | Discharge: 2012-09-03 | Disposition: A | Discharge status: Home / Self Care | Payer: Medicare Other | Source: Ambulatory Visit | Attending: Cardiology | Admitting: Cardiology

## 2012-09-03 DIAGNOSIS — I2510 Atherosclerotic heart disease of native coronary artery without angina pectoris: Secondary | ICD-10-CM | POA: Insufficient documentation

## 2012-09-03 DIAGNOSIS — I209 Angina pectoris, unspecified: Secondary | ICD-10-CM | POA: Insufficient documentation

## 2012-09-03 DIAGNOSIS — Z5189 Encounter for other specified aftercare: Secondary | ICD-10-CM | POA: Insufficient documentation

## 2012-09-03 DIAGNOSIS — Z9861 Coronary angioplasty status: Secondary | ICD-10-CM | POA: Insufficient documentation

## 2012-09-03 LAB — GLUCOSE, CAPILLARY: Glucose-Capillary: 154 mg/dL — ABNORMAL HIGH (ref 70–99)

## 2012-09-05 ENCOUNTER — Encounter (HOSPITAL_COMMUNITY)
Admission: RE | Admit: 2012-09-05 | Discharge: 2012-09-05 | Disposition: A | Discharge status: Home / Self Care | Payer: Medicare Other | Source: Ambulatory Visit | Attending: Cardiology | Admitting: Cardiology

## 2012-09-05 LAB — GLUCOSE, CAPILLARY: Glucose-Capillary: 281 mg/dL — ABNORMAL HIGH (ref 70–99)

## 2012-09-07 ENCOUNTER — Encounter (HOSPITAL_COMMUNITY)
Admission: RE | Admit: 2012-09-07 | Discharge: 2012-09-07 | Disposition: A | Discharge status: Home / Self Care | Payer: Medicare Other | Source: Ambulatory Visit | Attending: Cardiology | Admitting: Cardiology

## 2012-09-10 ENCOUNTER — Encounter (HOSPITAL_COMMUNITY)
Admission: RE | Admit: 2012-09-10 | Discharge: 2012-09-10 | Disposition: A | Discharge status: Home / Self Care | Payer: Medicare Other | Source: Ambulatory Visit | Attending: Cardiology | Admitting: Cardiology

## 2012-09-12 ENCOUNTER — Encounter (HOSPITAL_COMMUNITY)
Admission: RE | Admit: 2012-09-12 | Discharge: 2012-09-12 | Disposition: A | Discharge status: Home / Self Care | Payer: Medicare Other | Source: Ambulatory Visit | Attending: Cardiology | Admitting: Cardiology

## 2012-09-14 ENCOUNTER — Encounter (HOSPITAL_COMMUNITY)
Admission: RE | Admit: 2012-09-14 | Discharge: 2012-09-14 | Disposition: A | Discharge status: Home / Self Care | Payer: Medicare Other | Source: Ambulatory Visit | Attending: Cardiology | Admitting: Cardiology

## 2012-09-14 LAB — GLUCOSE, CAPILLARY: Glucose-Capillary: 324 mg/dL — ABNORMAL HIGH (ref 70–99)

## 2012-09-14 NOTE — Progress Notes (Signed)
Pt CBG-324 upon arrival to cardiac rehab today.  Pt reports home CBG-254 prior to eating oatmeal for breakfast.  Pt admits to eating 2 steak biscuits last night.  Pt states MD increased his insulin to 60 units qam, which he started today.  Pt did not exercise.  Pt instructed to drink extra water today and avoid increased simplex carbohydrates.  Urine negative for ketones.  Pt verbalized understanding

## 2012-09-17 ENCOUNTER — Encounter (HOSPITAL_COMMUNITY)
Admission: RE | Admit: 2012-09-17 | Discharge: 2012-09-17 | Disposition: A | Discharge status: Home / Self Care | Payer: Medicare Other | Source: Ambulatory Visit | Attending: Cardiology | Admitting: Cardiology

## 2012-09-17 ENCOUNTER — Telehealth (HOSPITAL_COMMUNITY): Payer: Self-pay | Admitting: Cardiac Rehabilitation

## 2012-09-17 LAB — GLUCOSE, CAPILLARY
Glucose-Capillary: 132 mg/dL — ABNORMAL HIGH (ref 70–99)
Glucose-Capillary: 55 mg/dL — ABNORMAL LOW (ref 70–99)
Glucose-Capillary: 94 mg/dL (ref 70–99)

## 2012-09-17 NOTE — Telephone Encounter (Signed)
Pt called rehab to report his CBG-77 upon arrival home.  He has not eaten since being treated for hypoglycemia at cardiac rehab.  Pt instructed to eat meal.  Pt also instructed to continue to monitor CBG today and notify MD if continues to have hypoglycemia.  Understanding verbalized.  Pt advised to come to [facility] nutrition class 09/18/2012 10am.  Pt declined stating he has previously scheduled appt.

## 2012-09-17 NOTE — Progress Notes (Signed)
8295 am-Pt CBG-55 post exercise today at cardiac rehab.  Pt asymptomatic.  Pt given glucose gel.  0941am - Recheck CBG-79.  Pt given gingerale, graham cracker with peanut butter.  0958am recheck CBG- 94.  Pt stable upon discharge from cardiac rehab.  Pt instructed to eat meal and recheck CBG upon arrival home.  Understanding verbalized

## 2012-09-19 ENCOUNTER — Encounter (HOSPITAL_COMMUNITY): Payer: Medicare Other

## 2012-09-21 ENCOUNTER — Encounter (HOSPITAL_COMMUNITY)
Admission: RE | Admit: 2012-09-21 | Discharge: 2012-09-21 | Disposition: A | Discharge status: Home / Self Care | Payer: Medicare Other | Source: Ambulatory Visit | Attending: Cardiology | Admitting: Cardiology

## 2012-09-21 LAB — GLUCOSE, CAPILLARY: Glucose-Capillary: 224 mg/dL — ABNORMAL HIGH (ref 70–99)

## 2012-09-24 ENCOUNTER — Encounter (HOSPITAL_COMMUNITY)
Admission: RE | Admit: 2012-09-24 | Discharge: 2012-09-24 | Disposition: A | Discharge status: Home / Self Care | Payer: Medicare Other | Source: Ambulatory Visit | Attending: Cardiology | Admitting: Cardiology

## 2012-09-24 NOTE — Progress Notes (Signed)
Pt BP- 180/92 upon arrival to cardiac rehab.  Recheck BP- 178/100, 168/92.  Pt c/o mild dizziness and fatigue.  Pt tolerated light exercise without difficulty.  BP- 170/80 during exercise.  Pt BP-144/82 post exercise.  PC to Dr. Norris Cross office, spoke to [facility], Dr. Norris Cross nurse.  Advised reports new order given per Dr. Mayford Knife for pt to start Amlodipine 2.5mg  once daily.  Duwayne Heck will inform pt of new order.

## 2012-09-28 ENCOUNTER — Encounter (HOSPITAL_COMMUNITY)
Admission: RE | Admit: 2012-09-28 | Discharge: 2012-09-28 | Disposition: A | Discharge status: Home / Self Care | Payer: Medicare Other | Source: Ambulatory Visit | Attending: Cardiology | Admitting: Cardiology

## 2012-09-28 LAB — GLUCOSE, CAPILLARY: Glucose-Capillary: 103 mg/dL — ABNORMAL HIGH (ref 70–99)

## 2012-09-28 NOTE — Progress Notes (Signed)
Pt in education class from 8:00 to 8:30.  At the completion of education class, pt reported that he felt like his blood sugar was dropping.  Pt reports that his blood sugar this morning at home was 166.  Pt cut his insulin back to 50 units instead of 60 units because of low post exericse glucose levels. Pt blood glucose 133. Pt bp checked 122/60 sitting and 102/60 standing.  After about 30 seconds of standing pt became dizzy and felt like he needed to sit down.  Pt given graham crackers and water.  Pt states that he had not had any dizziness at home and he started a new medication for his blood pressure amlodipine 2.5mg  on Monday.  Pt states that he takes it at night because no one told him anything different.  Reviewed medications with pt.  Pt also takes his Imdur 90 mg at bedtime and his Pacerone  At dinner time. Pt began to feel better. Dr. Mayford Knife office called to see if he could be seen in the office.  Relayed assessment,  Dr. Mayford Knife has an appointment open at 10:30.  Pt to wait at rehab until 10:00 then go over to her office.  At 9:15 pt remarks that he needs to lay down.  Pt was hot and sweaty to touch with yawning.  Blood glucose checked 103.  Pt bp sitting low 80's.  Pt given gatorade and was escorted to the treatment room.  Pt placed in lying position with feet elevated while he continued to sip on the gatorade.  Pt felt immediatly better.  Pt continued to lay down with feet elevated.  Upon finishing the gatorade orthostatic bp checked.  BP laying down - 118/60, sitting 120/64 and standing 122/70.  Pt stood for several minutes with no return of symptoms.   Pt feels able to drive himself to the office for evaluation.  Pt given rehab report with todays events for assessment by Dr. Mayford Knife.

## 2012-10-01 ENCOUNTER — Encounter (HOSPITAL_COMMUNITY)
Admission: RE | Admit: 2012-10-01 | Discharge: 2012-10-01 | Disposition: A | Discharge status: Home / Self Care | Payer: Medicare Other | Source: Ambulatory Visit | Attending: Cardiology | Admitting: Cardiology

## 2012-10-01 LAB — GLUCOSE, CAPILLARY: Glucose-Capillary: 144 mg/dL — ABNORMAL HIGH (ref 70–99)

## 2012-10-01 NOTE — Progress Notes (Signed)
Pt returned to rehab for exercise.  Pt seen in office by Dr. Mayford Knife on Friday 12/27.  Pt with medication time changes.  Pt is taking his amlodipine dose in the morning verses at night along with his other BP/heart medications.   Pt pre exercise BP 160/70 however BP decreased during exercise and remained WNL.  Pt blood sugar remained WNL for exercise.  Pt 's weight elevated due to holiday eating over the weekend with his wife's family.  Pt remarked that he was hesitant to come back to exercise because of the multiple issues in the past with BP and blood sugar readings prevented him from exercise.  Provided positive encouragement and support.

## 2012-10-03 ENCOUNTER — Encounter (HOSPITAL_COMMUNITY): Payer: Medicare Other

## 2012-10-05 ENCOUNTER — Encounter (HOSPITAL_COMMUNITY)
Admission: RE | Admit: 2012-10-05 | Discharge: 2012-10-05 | Disposition: A | Discharge status: Home / Self Care | Payer: Medicare Other | Source: Ambulatory Visit | Attending: Cardiology | Admitting: Cardiology

## 2012-10-05 DIAGNOSIS — I209 Angina pectoris, unspecified: Secondary | ICD-10-CM | POA: Insufficient documentation

## 2012-10-05 DIAGNOSIS — Z5189 Encounter for other specified aftercare: Secondary | ICD-10-CM | POA: Insufficient documentation

## 2012-10-05 DIAGNOSIS — I2510 Atherosclerotic heart disease of native coronary artery without angina pectoris: Secondary | ICD-10-CM | POA: Insufficient documentation

## 2012-10-05 DIAGNOSIS — Z9861 Coronary angioplasty status: Secondary | ICD-10-CM | POA: Insufficient documentation

## 2012-10-05 LAB — GLUCOSE, CAPILLARY: Glucose-Capillary: 162 mg/dL — ABNORMAL HIGH (ref 70–99)

## 2012-10-08 ENCOUNTER — Encounter (HOSPITAL_COMMUNITY)
Admission: RE | Admit: 2012-10-08 | Discharge: 2012-10-08 | Disposition: A | Discharge status: Home / Self Care | Payer: Medicare Other | Source: Ambulatory Visit | Attending: Cardiology | Admitting: Cardiology

## 2012-10-08 LAB — GLUCOSE, CAPILLARY
Glucose-Capillary: 186 mg/dL — ABNORMAL HIGH (ref 70–99)
Glucose-Capillary: 102 mg/dL — ABNORMAL HIGH (ref 70–99)

## 2012-10-10 ENCOUNTER — Encounter (HOSPITAL_COMMUNITY)
Admission: RE | Admit: 2012-10-10 | Discharge: 2012-10-10 | Disposition: A | Discharge status: Home / Self Care | Payer: Medicare Other | Source: Ambulatory Visit | Attending: Cardiology | Admitting: Cardiology

## 2012-10-10 LAB — GLUCOSE, CAPILLARY
Glucose-Capillary: 64 mg/dL — ABNORMAL LOW (ref 70–99)
Glucose-Capillary: 119 mg/dL — ABNORMAL HIGH (ref 70–99)

## 2012-10-10 NOTE — Progress Notes (Signed)
Pt arrived at cardiac rehab CBG-88, pt reports he ate oatmeal breakfast as usual, decreased insulin to 40units because am CBG was 157.  Pt given peanut butter crackers and lemonade.  15 minutes recheck CBG-64.  Pt asymptomatic.  Pt given additional lemonade.  Recheck-119.  Pt did not exercise.  Dr. Talmage Nap notified

## 2012-10-12 ENCOUNTER — Encounter (HOSPITAL_COMMUNITY)
Admission: RE | Admit: 2012-10-12 | Discharge: 2012-10-12 | Disposition: A | Discharge status: Home / Self Care | Payer: Medicare Other | Source: Ambulatory Visit | Attending: Cardiology | Admitting: Cardiology

## 2012-10-12 NOTE — Progress Notes (Signed)
Pt CBG-212 upon arrival to cardiac rehab today. Pt reports he only took insulin 35 units this am.  He ate usual oatmeal breakfast and a chocolate chip cookie.  Pt history and insulin dosage adjustment reviewed with Mickle Plumb, RD, CDE who advised pt to take insulin 15 units at lunch today if CBG >70 to avoid evening spike in CBG level.  Pt verbalized understanding

## 2012-10-15 ENCOUNTER — Encounter (HOSPITAL_COMMUNITY)
Admission: RE | Admit: 2012-10-15 | Discharge: 2012-10-15 | Disposition: A | Discharge status: Home / Self Care | Payer: Medicare Other | Source: Ambulatory Visit | Attending: Cardiology | Admitting: Cardiology

## 2012-10-17 ENCOUNTER — Encounter (HOSPITAL_COMMUNITY)
Admission: RE | Admit: 2012-10-17 | Discharge: 2012-10-17 | Disposition: A | Discharge status: Home / Self Care | Payer: Medicare Other | Source: Ambulatory Visit | Attending: Cardiology | Admitting: Cardiology

## 2012-10-19 ENCOUNTER — Encounter (HOSPITAL_COMMUNITY)
Admission: RE | Admit: 2012-10-19 | Discharge: 2012-10-19 | Disposition: A | Discharge status: Home / Self Care | Payer: Medicare Other | Source: Ambulatory Visit | Attending: Cardiology | Admitting: Cardiology

## 2012-10-19 LAB — GLUCOSE, CAPILLARY: Glucose-Capillary: 151 mg/dL — ABNORMAL HIGH (ref 70–99)

## 2012-10-22 ENCOUNTER — Encounter (HOSPITAL_COMMUNITY)
Admission: RE | Admit: 2012-10-22 | Discharge: 2012-10-22 | Disposition: A | Discharge status: Home / Self Care | Payer: Medicare Other | Source: Ambulatory Visit | Attending: Cardiology | Admitting: Cardiology

## 2012-10-22 LAB — GLUCOSE, CAPILLARY: Glucose-Capillary: 241 mg/dL — ABNORMAL HIGH (ref 70–99)

## 2012-10-24 ENCOUNTER — Telehealth (HOSPITAL_COMMUNITY): Payer: Self-pay | Admitting: Cardiac Rehabilitation

## 2012-10-24 ENCOUNTER — Encounter (HOSPITAL_COMMUNITY): Payer: Medicare Other

## 2012-10-24 NOTE — Telephone Encounter (Signed)
Pt called out today due to inclement weather

## 2012-10-26 ENCOUNTER — Encounter (HOSPITAL_COMMUNITY)
Admission: RE | Admit: 2012-10-26 | Discharge: 2012-10-26 | Disposition: A | Discharge status: Home / Self Care | Payer: Medicare Other | Source: Ambulatory Visit | Attending: Cardiology | Admitting: Cardiology

## 2012-10-29 ENCOUNTER — Encounter (HOSPITAL_COMMUNITY)
Admission: RE | Admit: 2012-10-29 | Discharge: 2012-10-29 | Disposition: A | Discharge status: Home / Self Care | Payer: Medicare Other | Source: Ambulatory Visit | Attending: Cardiology | Admitting: Cardiology

## 2012-10-29 LAB — GLUCOSE, CAPILLARY: Glucose-Capillary: 183 mg/dL — ABNORMAL HIGH (ref 70–99)

## 2012-10-31 ENCOUNTER — Encounter (HOSPITAL_COMMUNITY): Payer: Medicare Other

## 2012-11-02 ENCOUNTER — Encounter (HOSPITAL_COMMUNITY)
Admission: RE | Admit: 2012-11-02 | Discharge: 2012-11-02 | Disposition: A | Discharge status: Home / Self Care | Payer: Medicare Other | Source: Ambulatory Visit | Attending: Cardiology | Admitting: Cardiology

## 2012-11-02 LAB — GLUCOSE, CAPILLARY: Glucose-Capillary: 199 mg/dL — ABNORMAL HIGH (ref 70–99)

## 2012-11-02 NOTE — Progress Notes (Signed)
Riley Jordan 77 y.o. male Nutrition Note Spoke with pt.  Nutrition Plan and Nutrition Survey reviewed with pt. There are several areas in pt diet that need improvement for pt diet to become heart healthy. Pt states he wants to lose wt, but has not lost wt since starting rehab. Pt denies making any changes to his dietary habits to help promote wt loss. Pt is diabetic. Last A1c available indicates blood glucose poorly controlled. This Clinical research associate went over Diabetes Education test results. Per discussion with pt, pt's fasting CBG this am was "133, which is good for me." Pt ate old-fashioned instant oatmeal with a little 2% milk and gave himself "35 or 40 units of insulin." Pt decreases insulin on exercise days due to "low blood sugars during exercise." Pt reports eating 2 shortbread cookies pre-exercise "so my sugar won't be too low." Healthier pre-exercise breakfast for maintaining CBG's discussed. Pt takes Coumadin daily. Pt aware of some foods high in vitamin K. Consistent Vitamin K diet with Coumadin use discussed. Pt expressed understanding of the above information reviewed. Pt aware of nutrition education classes offered and "may" attend nutrition classes.  Nutrition Diagnosis   Food-and nutrition-related knowledge deficit related to lack of exposure to information as related to diagnosis of: ? CVD ? DM    Overweight related to excessive energy intake as evidenced by a BMI of 29.4  Nutrition RX/ Estimated Daily Nutrition Needs for: wt loss  1600-2100 Kcal, 45-55 gm fat, 10-14 gm sat fat, 1.6-2.1 gm trans-fat, <1500 mg sodium , 175-250 gm CHO   Nutrition Intervention   Pt's individual nutrition plan including cholesterol goals reviewed with pt.   Benefits of adopting Therapeutic Lifestyle Changes discussed when Medficts reviewed.   Pt to attend the Portion Distortion class   Pt to attend the  ? Nutrition I class                         ? Nutrition II class        ? Diabetes Blitz class       ?  Diabetes Q & A class - met 10/26/12 Pt given handouts for: ? Consistent vit K diet ? 1800 kcal, 5-day menu ideas ? Nutrition I class ? Nutrition II class    Continue client-centered nutrition education by RD, as part of interdisciplinary care. Goal(s)   Pt to identify and limit food sources of saturated fat, trans fat, and cholesterol   Pt to identify food quantities necessary to achieve: ? wt loss to a goal wt of 189-201 lb (86-91.4 kg) at graduation from cardiac rehab.    CBG concentrations in the normal range or as close to normal as is safely possible.   Pt able to name foods rich in vitamin K. (Pt taking Coumadin/Warfarin). Monitor and Evaluate progress toward nutrition goal with team. Nutrition Risk: High

## 2012-11-05 ENCOUNTER — Encounter (HOSPITAL_COMMUNITY)
Admission: RE | Admit: 2012-11-05 | Discharge: 2012-11-05 | Disposition: A | Discharge status: Home / Self Care | Payer: Medicare Other | Source: Ambulatory Visit | Attending: Cardiology | Admitting: Cardiology

## 2012-11-05 DIAGNOSIS — Z5189 Encounter for other specified aftercare: Secondary | ICD-10-CM | POA: Insufficient documentation

## 2012-11-05 DIAGNOSIS — I209 Angina pectoris, unspecified: Secondary | ICD-10-CM | POA: Insufficient documentation

## 2012-11-05 DIAGNOSIS — Z9861 Coronary angioplasty status: Secondary | ICD-10-CM | POA: Insufficient documentation

## 2012-11-05 DIAGNOSIS — I2510 Atherosclerotic heart disease of native coronary artery without angina pectoris: Secondary | ICD-10-CM | POA: Insufficient documentation

## 2012-11-07 ENCOUNTER — Encounter (HOSPITAL_COMMUNITY)
Admission: RE | Admit: 2012-11-07 | Discharge: 2012-11-07 | Disposition: A | Discharge status: Home / Self Care | Payer: Medicare Other | Source: Ambulatory Visit | Attending: Cardiology | Admitting: Cardiology

## 2012-11-09 ENCOUNTER — Encounter (HOSPITAL_COMMUNITY)
Admission: RE | Admit: 2012-11-09 | Discharge: 2012-11-09 | Disposition: A | Discharge status: Home / Self Care | Payer: Medicare Other | Source: Ambulatory Visit | Attending: Cardiology | Admitting: Cardiology

## 2012-11-12 ENCOUNTER — Encounter (HOSPITAL_COMMUNITY)
Admission: RE | Admit: 2012-11-12 | Discharge: 2012-11-12 | Disposition: A | Discharge status: Home / Self Care | Payer: Medicare Other | Source: Ambulatory Visit | Attending: Cardiology | Admitting: Cardiology

## 2012-11-14 ENCOUNTER — Encounter (HOSPITAL_COMMUNITY): Payer: Medicare Other

## 2012-11-16 ENCOUNTER — Encounter (HOSPITAL_COMMUNITY): Payer: Medicare Other

## 2012-11-19 ENCOUNTER — Encounter (HOSPITAL_COMMUNITY)
Admission: RE | Admit: 2012-11-19 | Discharge: 2012-11-19 | Disposition: A | Discharge status: Home / Self Care | Payer: Medicare Other | Source: Ambulatory Visit | Attending: Cardiology | Admitting: Cardiology

## 2012-11-19 NOTE — Progress Notes (Signed)
Pt pre exercise blood glucose checked - 331.  Pt reports that his home am blood glucose was 240.  Pt took his full dose of insulin typically pt will adjust his insulin dose on exercise days depending on the reading.  Pt stated that he did not sleep well though the night and felt very tired. Reviewed am meal - appropriate plain insulin with black coffee.  Pt had discomfort from reflux due to eating a heavy meal later than usual and laying down and drank pepto bismol till it eased off. Pt given h20 to drink, ketones negative.  Advised pt to continue drinking water through the day and periodically spot check his blood sugar.  Pt verbalized understanding.

## 2012-11-21 ENCOUNTER — Encounter (HOSPITAL_COMMUNITY)
Admission: RE | Admit: 2012-11-21 | Discharge: 2012-11-21 | Disposition: A | Discharge status: Home / Self Care | Payer: Medicare Other | Source: Ambulatory Visit | Attending: Cardiology | Admitting: Cardiology

## 2012-11-21 LAB — GLUCOSE, CAPILLARY
Glucose-Capillary: 202 mg/dL — ABNORMAL HIGH (ref 70–99)
Glucose-Capillary: 207 mg/dL — ABNORMAL HIGH (ref 70–99)

## 2012-11-23 ENCOUNTER — Encounter (HOSPITAL_COMMUNITY)
Admission: RE | Admit: 2012-11-23 | Discharge: 2012-11-23 | Disposition: A | Discharge status: Home / Self Care | Payer: Medicare Other | Source: Ambulatory Visit | Attending: Cardiology | Admitting: Cardiology

## 2012-11-26 ENCOUNTER — Encounter (HOSPITAL_COMMUNITY)
Admission: RE | Admit: 2012-11-26 | Discharge: 2012-11-26 | Disposition: A | Discharge status: Home / Self Care | Payer: Medicare Other | Source: Ambulatory Visit | Attending: Cardiology | Admitting: Cardiology

## 2012-11-26 LAB — GLUCOSE, CAPILLARY: Glucose-Capillary: 269 mg/dL — ABNORMAL HIGH (ref 70–99)

## 2012-11-28 ENCOUNTER — Encounter (HOSPITAL_COMMUNITY)
Admission: RE | Admit: 2012-11-28 | Discharge: 2012-11-28 | Disposition: A | Discharge status: Home / Self Care | Payer: Medicare Other | Source: Ambulatory Visit | Attending: Cardiology | Admitting: Cardiology

## 2012-11-28 LAB — GLUCOSE, CAPILLARY: Glucose-Capillary: 280 mg/dL — ABNORMAL HIGH (ref 70–99)

## 2012-11-30 ENCOUNTER — Encounter (HOSPITAL_COMMUNITY)
Admission: RE | Admit: 2012-11-30 | Discharge: 2012-11-30 | Disposition: A | Discharge status: Home / Self Care | Payer: Medicare Other | Source: Ambulatory Visit | Attending: Cardiology | Admitting: Cardiology

## 2012-12-03 ENCOUNTER — Encounter (HOSPITAL_COMMUNITY)
Admission: RE | Admit: 2012-12-03 | Discharge: 2012-12-03 | Disposition: A | Discharge status: Home / Self Care | Payer: Medicare Other | Source: Ambulatory Visit | Attending: Cardiology | Admitting: Cardiology

## 2012-12-03 DIAGNOSIS — I2510 Atherosclerotic heart disease of native coronary artery without angina pectoris: Secondary | ICD-10-CM | POA: Insufficient documentation

## 2012-12-03 DIAGNOSIS — Z5189 Encounter for other specified aftercare: Secondary | ICD-10-CM | POA: Insufficient documentation

## 2012-12-03 DIAGNOSIS — I209 Angina pectoris, unspecified: Secondary | ICD-10-CM | POA: Insufficient documentation

## 2012-12-03 DIAGNOSIS — Z9861 Coronary angioplasty status: Secondary | ICD-10-CM | POA: Insufficient documentation

## 2012-12-05 ENCOUNTER — Encounter (HOSPITAL_COMMUNITY)
Admission: RE | Admit: 2012-12-05 | Discharge: 2012-12-05 | Disposition: A | Discharge status: Home / Self Care | Payer: Medicare Other | Source: Ambulatory Visit | Attending: Cardiology | Admitting: Cardiology

## 2012-12-05 LAB — GLUCOSE, CAPILLARY: Glucose-Capillary: 88 mg/dL (ref 70–99)

## 2012-12-05 NOTE — Progress Notes (Signed)
Pt pre-exercise CBG-89.  Pt states he ate usual breakfast and took 40 units of insulin.  Pt given snack however recheck CBG-88.  Pt did not exercise.

## 2012-12-07 ENCOUNTER — Encounter (HOSPITAL_COMMUNITY): Payer: Medicare Other

## 2012-12-10 ENCOUNTER — Encounter (HOSPITAL_COMMUNITY): Payer: Medicare Other

## 2012-12-12 ENCOUNTER — Encounter (HOSPITAL_COMMUNITY)
Admission: RE | Admit: 2012-12-12 | Discharge: 2012-12-12 | Disposition: A | Discharge status: Home / Self Care | Payer: Medicare Other | Source: Ambulatory Visit | Attending: Cardiology | Admitting: Cardiology

## 2012-12-14 ENCOUNTER — Encounter (HOSPITAL_COMMUNITY)
Admission: RE | Admit: 2012-12-14 | Discharge: 2012-12-14 | Disposition: A | Discharge status: Home / Self Care | Payer: Medicare Other | Source: Ambulatory Visit | Attending: Cardiology | Admitting: Cardiology

## 2012-12-17 ENCOUNTER — Encounter (HOSPITAL_COMMUNITY)
Admission: RE | Admit: 2012-12-17 | Discharge: 2012-12-17 | Disposition: A | Discharge status: Home / Self Care | Payer: Medicare Other | Source: Ambulatory Visit | Attending: Cardiology | Admitting: Cardiology

## 2012-12-19 ENCOUNTER — Encounter (HOSPITAL_COMMUNITY)
Admission: RE | Admit: 2012-12-19 | Discharge: 2012-12-19 | Disposition: A | Discharge status: Home / Self Care | Payer: Medicare Other | Source: Ambulatory Visit | Attending: Cardiology | Admitting: Cardiology

## 2012-12-21 ENCOUNTER — Encounter (HOSPITAL_COMMUNITY)
Admission: RE | Admit: 2012-12-21 | Discharge: 2012-12-21 | Disposition: A | Discharge status: Home / Self Care | Payer: Medicare Other | Source: Ambulatory Visit | Attending: Cardiology | Admitting: Cardiology

## 2012-12-24 ENCOUNTER — Encounter (HOSPITAL_COMMUNITY): Payer: Medicare Other

## 2012-12-26 ENCOUNTER — Encounter (HOSPITAL_COMMUNITY)
Admission: RE | Admit: 2012-12-26 | Discharge: 2012-12-26 | Disposition: A | Discharge status: Home / Self Care | Payer: Medicare Other | Source: Ambulatory Visit | Attending: Cardiology | Admitting: Cardiology

## 2012-12-26 ENCOUNTER — Encounter (HOSPITAL_COMMUNITY): Payer: Self-pay

## 2012-12-26 NOTE — Progress Notes (Signed)
Pt graduates from cardiac rehab program today.  Quality of life reviewed.  Pt reports moderate dissatisfaction with health related concerns. Pt contributes this to health related changes and poorly controlled diabetes.  Pt has been referred to outpatient diabetes management center.  Pt overall has good quality of life and PHQ score -4.  Pt states he plans to exercise on his own with home equipment.  Pt given information about cardiac maintenance program and resources for community exercise options to increase socialization and accountability.  Pt should be commended for his efforts in cardiac rehab program.

## 2012-12-28 ENCOUNTER — Encounter (HOSPITAL_COMMUNITY): Payer: Medicare Other

## 2013-06-29 ENCOUNTER — Other Ambulatory Visit: Payer: Self-pay | Admitting: Cardiology

## 2013-06-29 DIAGNOSIS — E7800 Pure hypercholesterolemia, unspecified: Secondary | ICD-10-CM

## 2013-06-29 DIAGNOSIS — Z79899 Other long term (current) drug therapy: Secondary | ICD-10-CM

## 2013-08-07 ENCOUNTER — Telehealth: Payer: Self-pay

## 2013-08-07 NOTE — Telephone Encounter (Signed)
Patient called wanting samples of zetia samples put up front

## 2013-08-24 ENCOUNTER — Encounter: Payer: Self-pay | Admitting: Cardiology

## 2013-08-24 DIAGNOSIS — E039 Hypothyroidism, unspecified: Secondary | ICD-10-CM | POA: Insufficient documentation

## 2013-08-24 DIAGNOSIS — F329 Major depressive disorder, single episode, unspecified: Secondary | ICD-10-CM | POA: Insufficient documentation

## 2013-08-24 DIAGNOSIS — Z973 Presence of spectacles and contact lenses: Secondary | ICD-10-CM | POA: Insufficient documentation

## 2013-08-24 DIAGNOSIS — M1990 Unspecified osteoarthritis, unspecified site: Secondary | ICD-10-CM | POA: Insufficient documentation

## 2013-08-24 DIAGNOSIS — I219 Acute myocardial infarction, unspecified: Secondary | ICD-10-CM | POA: Insufficient documentation

## 2013-08-24 DIAGNOSIS — E119 Type 2 diabetes mellitus without complications: Secondary | ICD-10-CM | POA: Insufficient documentation

## 2013-08-24 DIAGNOSIS — I2510 Atherosclerotic heart disease of native coronary artery without angina pectoris: Secondary | ICD-10-CM | POA: Insufficient documentation

## 2013-08-24 DIAGNOSIS — K219 Gastro-esophageal reflux disease without esophagitis: Secondary | ICD-10-CM | POA: Insufficient documentation

## 2013-08-24 DIAGNOSIS — E785 Hyperlipidemia, unspecified: Secondary | ICD-10-CM | POA: Insufficient documentation

## 2013-08-24 DIAGNOSIS — I209 Angina pectoris, unspecified: Secondary | ICD-10-CM | POA: Insufficient documentation

## 2013-08-24 DIAGNOSIS — Z9289 Personal history of other medical treatment: Secondary | ICD-10-CM | POA: Insufficient documentation

## 2013-08-24 DIAGNOSIS — E1151 Type 2 diabetes mellitus with diabetic peripheral angiopathy without gangrene: Secondary | ICD-10-CM | POA: Insufficient documentation

## 2013-08-27 ENCOUNTER — Ambulatory Visit: Payer: Medicare Other | Admitting: Cardiology

## 2013-08-28 ENCOUNTER — Encounter: Payer: Self-pay | Admitting: Cardiology

## 2013-08-28 ENCOUNTER — Ambulatory Visit (INDEPENDENT_AMBULATORY_CARE_PROVIDER_SITE_OTHER): Payer: Medicare Other | Admitting: Cardiology

## 2013-08-28 ENCOUNTER — Other Ambulatory Visit: Payer: Self-pay | Admitting: General Surgery

## 2013-08-28 VITALS — BP 142/76 | HR 66 | Ht 71.5 in | Wt 208.4 lb

## 2013-08-28 DIAGNOSIS — I480 Paroxysmal atrial fibrillation: Secondary | ICD-10-CM

## 2013-08-28 DIAGNOSIS — E785 Hyperlipidemia, unspecified: Secondary | ICD-10-CM

## 2013-08-28 DIAGNOSIS — I4891 Unspecified atrial fibrillation: Secondary | ICD-10-CM

## 2013-08-28 DIAGNOSIS — I10 Essential (primary) hypertension: Secondary | ICD-10-CM

## 2013-08-28 DIAGNOSIS — I4819 Other persistent atrial fibrillation: Secondary | ICD-10-CM | POA: Insufficient documentation

## 2013-08-28 DIAGNOSIS — I2510 Atherosclerotic heart disease of native coronary artery without angina pectoris: Secondary | ICD-10-CM

## 2013-08-28 NOTE — Progress Notes (Signed)
[address] [place], [place]  [address] Phone: [phone] Fax:  [phone]  Date:  08/28/2013   ID:  Riley Jordan, DOB 04-03-1936, MRN [id number]  PCP:  Kaleen Mask, MD  Cardiologist:  Armanda Magic, MD     History of Present Illness: Riley SHEHADEH is a 77 y.o. male with a history of ASCAD, HTN, dyslipidemia, PAF who presents today for followup.  He is doing well.  He denies any chest pain, SOB, DOE, LE edema, dizziness, palpitation or syncope.     Wt Readings from Last 3 Encounters:  08/28/13 208 lb 6.4 oz (94.53 kg)  08/23/12 213 lb 10 oz (96.9 kg)  07/13/12 210 lb 15.7 oz (95.7 kg)     Past Medical History  Diagnosis Date  . Wears glasses   . Hypothyroidism   . GERD (gastroesophageal reflux disease)   . Anginal pain   . Heart attack 2000  . Type II diabetes mellitus   . History of blood transfusion 1990's    S/P back OR  . Arthritis     "back; hands" (07/30/2012)  . Depression     "wife died 01-29-12" (07/30/2012)  . Diabetic peripheral angiopathy   . High cholesterol     statin intolerant  . CAD (coronary atherosclerotic disease)     s/ PCI left circ/OM 09/1999, cutting balloon PCI mid LAD 08/2001, , chronically untreated tortuous lateral marginal branch, DES to prox to mid LAD and DES to mid circ 10/13    Current Outpatient Prescriptions  Medication Sig Dispense Refill  . acetaminophen (TYLENOL) 500 MG tablet Take 500-1,000 mg by mouth 2 (two) times daily. Takes 1000mg  in the am and 500mg  in the pm      . amiodarone (PACERONE) 200 MG tablet Take 200 mg by mouth daily.      Marland Kitchen amLODipine (NORVASC) 5 MG tablet Take 5 mg by mouth daily.      Marland Kitchen atorvastatin (LIPITOR) 10 MG tablet Take 10 mg by mouth daily.      . clopidogrel (PLAVIX) 75 MG tablet Take 1 tablet (75 mg total) by mouth daily.  30 tablet  11  . ezetimibe (ZETIA) 10 MG tablet Take 10 mg by mouth daily.      . insulin NPH-insulin regular (NOVOLIN 70/30) (70-30) 100 UNIT/ML  injection Inject 35-50 Units into the skin 2 (two) times daily with a meal. 60 units qAM and 35 units  qPM      . isosorbide mononitrate (IMDUR) 60 MG 24 hr tablet Take 90 mg by mouth daily.       Marland Kitchen levothyroxine (SYNTHROID, LEVOTHROID) 175 MCG tablet Take 175 mcg by mouth daily.      . meclizine (ANTIVERT) 25 MG tablet Take 25 mg by mouth at bedtime.       . metFORMIN (GLUCOPHAGE) 1000 MG tablet Take 1,000 mg by mouth 2 (two) times daily with a meal.      . nitroGLYCERIN (NITROSTAT) 0.4 MG SL tablet Place 0.4 mg under the tongue every 5 (five) minutes as needed for chest pain.      Marland Kitchen ofloxacin (OCUFLOX) 0.3 % ophthalmic solution       . pantoprazole (PROTONIX) 40 MG tablet Take 1 tablet (40 mg total) by mouth daily at 6 (six) AM.  30 tablet  11  . warfarin (COUMADIN) 5 MG tablet Take 2.5-5 mg by mouth daily. Alternates daily with 5mg  and 2.5mg        No current facility-administered  medications for this visit.    Allergies:   No Known Allergies  Social History:  The patient  reports that he quit smoking about 23 years ago. His smoking use included Cigarettes. He has a 40 pack-year smoking history. He has never used smokeless tobacco. He reports that he drinks alcohol. He reports that he does not use illicit drugs.   Family History:  The patient's family history includes Cancer in his father and mother.   ROS:  Please see the history of present illness.      All other systems reviewed and negative.   PHYSICAL EXAM: VS:  BP 142/76  Pulse 66  Ht 5' 11.5" (1.816 m)  Wt 208 lb 6.4 oz (94.53 kg)  BMI 28.66 kg/m2 Well nourished, well developed, in no acute distress HEENT: normal Neck: no JVD Cardiac:  normal S1, S2; RRR; no murmur Lungs:  clear to auscultation bilaterally, no wheezing, rhonchi or rales Abd: soft, nontender, no hepatomegaly Ext: no edema Skin: warm and dry Neuro:  CNs 2-12 intact, no focal abnormalities noted  EKG:  NSR     ASSESSMENT AND PLAN:  1. ASCAD with no  angina  - continue Imdur  - stop Plavix since his last PCI was a year and he is on warfarin 2. HTN well controlled  - Continue amlodipine 3. Dyslipidemia  - continue atorvastatin/Zetia 4. PAF maintaining NSR  - continue amiodarone and warfarin  - check LFT's and PFT's - he is up to date on TSH  Followup with me in 6 months  Signed, Armanda Magic, MD 08/28/2013 11:47 AM

## 2013-08-28 NOTE — Patient Instructions (Signed)
Your physician has recommended you make the following change in your medication: Stop Plavix 75 MG daily  Your physician recommends that you return for lab work on 09/04/13 for Fasting Lipid and ALT  Your physician has recommended that you have a pulmonary function test. Pulmonary Function Tests are a group of tests that measure how well air moves in and out of your lungs.  Your physician wants you to follow-up in: 6 Months with Dr Sherlyn Lick will receive a reminder letter in the mail two months in advance. If you don't receive a letter, please call our office to schedule the follow-up appointment.

## 2013-09-04 ENCOUNTER — Other Ambulatory Visit: Payer: Medicare Other

## 2013-09-04 ENCOUNTER — Other Ambulatory Visit (INDEPENDENT_AMBULATORY_CARE_PROVIDER_SITE_OTHER): Payer: Medicare Other

## 2013-09-04 DIAGNOSIS — E7800 Pure hypercholesterolemia, unspecified: Secondary | ICD-10-CM

## 2013-09-04 DIAGNOSIS — Z79899 Other long term (current) drug therapy: Secondary | ICD-10-CM

## 2013-09-04 LAB — HEPATIC FUNCTION PANEL
Alkaline Phosphatase: 59 U/L (ref 39–117)
Bilirubin, Direct: 0.2 mg/dL (ref 0.0–0.3)
Total Bilirubin: 0.9 mg/dL (ref 0.3–1.2)
Total Protein: 6.9 g/dL (ref 6.0–8.3)

## 2013-09-04 LAB — LIPID PANEL
HDL: 30.5 mg/dL — ABNORMAL LOW (ref >39.00)
LDL Cholesterol: 49 mg/dL (ref 0–99)
Total CHOL/HDL Ratio: 3
VLDL: 17.2 mg/dL (ref 0.0–40.0)

## 2013-09-05 ENCOUNTER — Other Ambulatory Visit: Payer: Self-pay | Admitting: General Surgery

## 2013-09-05 ENCOUNTER — Encounter: Payer: Self-pay | Admitting: General Surgery

## 2013-09-05 DIAGNOSIS — E785 Hyperlipidemia, unspecified: Secondary | ICD-10-CM

## 2013-09-05 DIAGNOSIS — Z79899 Other long term (current) drug therapy: Secondary | ICD-10-CM

## 2013-09-25 ENCOUNTER — Ambulatory Visit (INDEPENDENT_AMBULATORY_CARE_PROVIDER_SITE_OTHER): Payer: Medicare Other | Admitting: Internal Medicine

## 2013-09-25 DIAGNOSIS — I4891 Unspecified atrial fibrillation: Secondary | ICD-10-CM

## 2013-09-25 DIAGNOSIS — I480 Paroxysmal atrial fibrillation: Secondary | ICD-10-CM

## 2013-09-25 LAB — PULMONARY FUNCTION TEST
DL/VA % pred: 97 %
DLCO unc: 29.92 ml/min/mmHg
FEF2575-%Change-Post: 7 %
FEV1-%Change-Post: 1 %
FEV1-%Pred-Post: 87 %
FEV1-%Pred-Pre: 86 %
FEV1-Post: 2.9 L
FEV1FVC-%Change-Post: 0 %
FEV6-%Pred-Pre: 85 %
FEV6-Pre: 3.67 L
FEV6FVC-%Pred-Post: 105 %
FEV6FVC-%Pred-Pre: 105 %
FVC-%Pred-Post: 82 %
FVC-Pre: 3.69 L
Post FEV1/FVC ratio: 77 %
Post FEV6/FVC ratio: 99 %
Pre FEV6/FVC Ratio: 99 %
RV % pred: 127 %
RV: 3.4 L
TLC % pred: 95 %

## 2013-09-25 NOTE — Progress Notes (Signed)
PFT done today. 

## 2013-10-04 NOTE — Progress Notes (Signed)
PT is aware.

## 2013-10-10 ENCOUNTER — Other Ambulatory Visit: Payer: Self-pay

## 2013-10-10 MED ORDER — AMLODIPINE BESYLATE 5 MG PO TABS: 5.0000 mg | ORAL_TABLET | Freq: Every day | ORAL | Status: DC

## 2013-10-17 ENCOUNTER — Other Ambulatory Visit: Payer: Self-pay

## 2013-10-17 MED ORDER — AMLODIPINE BESYLATE 5 MG PO TABS: 5.0000 mg | ORAL_TABLET | Freq: Every day | ORAL | Status: DC

## 2013-10-17 MED ORDER — ISOSORBIDE MONONITRATE ER 60 MG PO TB24: 90.0000 mg | ORAL_TABLET | Freq: Every day | ORAL | Status: DC

## 2013-10-18 ENCOUNTER — Telehealth: Payer: Self-pay

## 2013-10-18 NOTE — Telephone Encounter (Signed)
Patient call to get samples of zetia placed them up front

## 2013-12-09 ENCOUNTER — Telehealth: Payer: Self-pay | Admitting: *Deleted

## 2013-12-09 NOTE — Telephone Encounter (Signed)
Patient requests zetia samples. I will place them at the front desk for pick up.

## 2014-01-15 ENCOUNTER — Other Ambulatory Visit: Payer: Self-pay

## 2014-01-15 MED ORDER — ATORVASTATIN CALCIUM 10 MG PO TABS: 10.0000 mg | ORAL_TABLET | Freq: Every day | ORAL | Status: DC

## 2014-01-15 NOTE — Telephone Encounter (Signed)
Patient called for samples of zetia placed up front

## 2014-02-03 ENCOUNTER — Other Ambulatory Visit (INDEPENDENT_AMBULATORY_CARE_PROVIDER_SITE_OTHER): Payer: Medicare Other

## 2014-02-03 DIAGNOSIS — Z79899 Other long term (current) drug therapy: Secondary | ICD-10-CM

## 2014-02-03 DIAGNOSIS — E785 Hyperlipidemia, unspecified: Secondary | ICD-10-CM

## 2014-02-03 LAB — LIPID PANEL
CHOL/HDL RATIO: 4
CHOLESTEROL: 118 mg/dL (ref 0–200)
HDL: 28 mg/dL — ABNORMAL LOW (ref >39.00)
LDL Cholesterol: 46 mg/dL (ref 0–99)
TRIGLYCERIDES: 218 mg/dL — AB (ref 0.0–149.0)
VLDL: 43.6 mg/dL — ABNORMAL HIGH (ref 0.0–40.0)

## 2014-02-03 LAB — HEPATIC FUNCTION PANEL
ALBUMIN: 3.9 g/dL (ref 3.5–5.2)
ALK PHOS: 68 U/L (ref 39–117)
ALT: 16 U/L (ref 0–53)
AST: 16 U/L (ref 0–37)
Bilirubin, Direct: 0.2 mg/dL (ref 0.0–0.3)
Total Bilirubin: 1.4 mg/dL — ABNORMAL HIGH (ref 0.3–1.2)
Total Protein: 6.8 g/dL (ref 6.0–8.3)

## 2014-02-04 ENCOUNTER — Encounter: Payer: Self-pay | Admitting: General Surgery

## 2014-02-04 ENCOUNTER — Other Ambulatory Visit: Payer: Self-pay | Admitting: General Surgery

## 2014-02-04 DIAGNOSIS — E785 Hyperlipidemia, unspecified: Secondary | ICD-10-CM

## 2014-02-25 ENCOUNTER — Ambulatory Visit (INDEPENDENT_AMBULATORY_CARE_PROVIDER_SITE_OTHER): Payer: Medicare Other | Admitting: Cardiology

## 2014-02-25 ENCOUNTER — Encounter: Payer: Self-pay | Admitting: Cardiology

## 2014-02-25 VITALS — BP 152/82 | HR 72 | Ht 73.0 in | Wt 214.0 lb

## 2014-02-25 DIAGNOSIS — I480 Paroxysmal atrial fibrillation: Secondary | ICD-10-CM

## 2014-02-25 DIAGNOSIS — I2510 Atherosclerotic heart disease of native coronary artery without angina pectoris: Secondary | ICD-10-CM

## 2014-02-25 DIAGNOSIS — I4891 Unspecified atrial fibrillation: Secondary | ICD-10-CM

## 2014-02-25 DIAGNOSIS — E785 Hyperlipidemia, unspecified: Secondary | ICD-10-CM

## 2014-02-25 DIAGNOSIS — I10 Essential (primary) hypertension: Secondary | ICD-10-CM

## 2014-02-25 MED ORDER — AMIODARONE HCL 200 MG PO TABS: 200.0000 mg | ORAL_TABLET | Freq: Every day | ORAL | Status: DC

## 2014-02-25 NOTE — Progress Notes (Signed)
[address], [place], [place]  [address] Phone: [phone] Fax:  [phone]  Date:  02/25/2014   ID:  Riley Jordan, DOB 20-Sep-1936, MRN [id number]  PCP:  Leonard Downing, MD  Cardiologist:  Fransico Him, MD     History of Present Illness: Riley Jordan is a 78 y.o. male with a history of ASCAD, HTN, dyslipidemia, PAF who presents today for followup. He is doing well. He denies any chest pain, SOB, DOE,  dizziness, palpitation or syncope. He has chronic LE edema which is stable.    Wt Readings from Last 3 Encounters:  02/25/14 214 lb (97.07 kg)  08/28/13 208 lb 6.4 oz (94.53 kg)  08/23/12 213 lb 10 oz (96.9 kg)     Past Medical History  Diagnosis Date  . Wears glasses   . Hypothyroidism   . GERD (gastroesophageal reflux disease)   . Anginal pain   . Heart attack 01-24-99  . Type II diabetes mellitus   . History of blood transfusion 1990's    S/P back OR  . Arthritis     "back; hands" (August 01, 2012)  . Depression     "wife died 01/24/2012" (08-01-12)  . Diabetic peripheral angiopathy   . High cholesterol     statin intolerant  . CAD (coronary atherosclerotic disease)     s/ PCI left circ/OM 09/1999, cutting balloon PCI mid LAD 08/2001, , chronically untreated tortuous lateral marginal branch, DES to prox to mid LAD and DES to mid circ 10/13  . Hypertension   . PAF (paroxysmal atrial fibrillation)     Current Outpatient Prescriptions  Medication Sig Dispense Refill  . acetaminophen (TYLENOL) 500 MG tablet Take 500-1,000 mg by mouth 2 (two) times daily. Takes 1000mg  in the am and 500mg  in the pm      . amiodarone (PACERONE) 200 MG tablet Take 200 mg by mouth daily.      Marland Kitchen amLODipine (NORVASC) 5 MG tablet Take 1 tablet (5 mg total) by mouth daily.  90 tablet  2  . atorvastatin (LIPITOR) 10 MG tablet Take 1 tablet (10 mg total) by mouth daily.  90 tablet  1  . ezetimibe (ZETIA) 10 MG tablet Take 10 mg by mouth daily.      . ferrous fumarate (HEMOCYTE - 106  MG FE) 325 (106 FE) MG TABS tablet Take 1 tablet by mouth daily.      . insulin NPH-insulin regular (NOVOLIN 70/30) (70-30) 100 UNIT/ML injection Inject 35-50 Units into the skin 2 (two) times daily with a meal. 60 units qAM and 35 units  qPM      . isosorbide mononitrate (IMDUR) 60 MG 24 hr tablet Take 1.5 tablets (90 mg total) by mouth daily.  135 tablet  2  . levothyroxine (SYNTHROID, LEVOTHROID) 175 MCG tablet Take 175 mcg by mouth daily.      . meclizine (ANTIVERT) 25 MG tablet Take 25 mg by mouth at bedtime.       . metFORMIN (GLUCOPHAGE) 1000 MG tablet Take 1,000 mg by mouth 2 (two) times daily with a meal.      . nitroGLYCERIN (NITROSTAT) 0.4 MG SL tablet Place 0.4 mg under the tongue every 5 (five) minutes as needed for chest pain.      Marland Kitchen ofloxacin (OCUFLOX) 0.3 % ophthalmic solution       . pantoprazole (PROTONIX) 40 MG tablet Take 1 tablet (40 mg total) by mouth daily at 6 (six) AM.  30 tablet  11  .  warfarin (COUMADIN) 5 MG tablet Take 2.5-5 mg by mouth daily. Alternates daily with 5mg  and 2.5mg        No current facility-administered medications for this visit.    Allergies:   No Known Allergies  Social History:  The patient  reports that he quit smoking about 24 years ago. His smoking use included Cigarettes. He has a 40 pack-year smoking history. He has never used smokeless tobacco. He reports that he drinks alcohol. He reports that he does not use illicit drugs.   Family History:  The patient's family history includes Cancer in his father and mother.   ROS:  Please see the history of present illness.      All other systems reviewed and negative.   PHYSICAL EXAM: VS:  BP 152/82  Pulse 72  Ht 6\' 1"  (1.854 m)  Wt 214 lb (97.07 kg)  BMI 28.24 kg/m2 Well nourished, well developed, in no acute distress HEENT: normal Neck: no JVD Cardiac:  normal S1, S2; RRR; no murmur Lungs:  clear to auscultation bilaterally, no wheezing, rhonchi or rales Abd: soft, nontender, no  hepatomegaly Ext: no edema Skin: warm and dry Neuro:  CNs 2-12 intact, no focal abnormalities noted     ASSESSMENT AND PLAN:  1. ASCAD with no angina - continue Imdur   2. HTN borderline control - Continue amlodipine  3. Dyslipidemia - LDL at goal - continue atorvastatin/Zetia  4. PAF maintaining NSR - continue amiodarone and warfarin   Followup with me in 6 months    Signed, Fransico Him, MD 02/25/2014 11:00 AM

## 2014-02-25 NOTE — Patient Instructions (Signed)
Your physician recommends that you continue on your current medications as directed. Please refer to the Current Medication list given to you today.  Your physician wants you to follow-up in: 6 month ov You will receive a reminder letter in the mail two months in advance. If you don't receive a letter, please call our office to schedule the follow-up appointment.  

## 2014-03-06 ENCOUNTER — Other Ambulatory Visit: Payer: Medicare Other

## 2014-06-02 ENCOUNTER — Telehealth: Payer: Self-pay | Admitting: *Deleted

## 2014-06-02 NOTE — Telephone Encounter (Signed)
Zetia samples placed at front desk for pick up.

## 2014-07-07 ENCOUNTER — Other Ambulatory Visit: Payer: Self-pay | Admitting: *Deleted

## 2014-07-07 MED ORDER — AMLODIPINE BESYLATE 5 MG PO TABS: 5.0000 mg | ORAL_TABLET | Freq: Every day | ORAL | Status: DC

## 2014-07-07 MED ORDER — ISOSORBIDE MONONITRATE ER 60 MG PO TB24: 90.0000 mg | ORAL_TABLET | Freq: Every day | ORAL | Status: DC

## 2014-07-18 ENCOUNTER — Telehealth: Payer: Self-pay

## 2014-07-18 NOTE — Telephone Encounter (Signed)
PATIENT CALLED TO GET SAMPLE OF ZETIA PLACE SAMPLES AT FRONT DESK

## 2014-08-06 ENCOUNTER — Other Ambulatory Visit (INDEPENDENT_AMBULATORY_CARE_PROVIDER_SITE_OTHER): Payer: Medicare Other | Admitting: *Deleted

## 2014-08-06 DIAGNOSIS — E785 Hyperlipidemia, unspecified: Secondary | ICD-10-CM

## 2014-08-06 LAB — LIPID PANEL
Cholesterol: 94 mg/dL (ref 0–200)
HDL: 29 mg/dL — ABNORMAL LOW (ref >39.00)
LDL CALC: 44 mg/dL (ref 0–99)
NONHDL: 65
Total CHOL/HDL Ratio: 3
Triglycerides: 107 mg/dL (ref 0.0–149.0)
VLDL: 21.4 mg/dL (ref 0.0–40.0)

## 2014-08-06 LAB — HEPATIC FUNCTION PANEL
ALT: 18 U/L (ref 0–53)
AST: 23 U/L (ref 0–37)
Albumin: 3.4 g/dL — ABNORMAL LOW (ref 3.5–5.2)
Alkaline Phosphatase: 55 U/L (ref 39–117)
BILIRUBIN TOTAL: 1.5 mg/dL — AB (ref 0.2–1.2)
Bilirubin, Direct: 0.2 mg/dL (ref 0.0–0.3)
Total Protein: 6.7 g/dL (ref 6.0–8.3)

## 2014-08-18 ENCOUNTER — Ambulatory Visit (INDEPENDENT_AMBULATORY_CARE_PROVIDER_SITE_OTHER): Payer: Medicare Other | Admitting: Cardiology

## 2014-08-18 ENCOUNTER — Encounter: Payer: Self-pay | Admitting: Cardiology

## 2014-08-18 VITALS — BP 148/90 | HR 64 | Ht 72.0 in | Wt 205.0 lb

## 2014-08-18 DIAGNOSIS — I10 Essential (primary) hypertension: Secondary | ICD-10-CM

## 2014-08-18 DIAGNOSIS — E785 Hyperlipidemia, unspecified: Secondary | ICD-10-CM

## 2014-08-18 DIAGNOSIS — I2510 Atherosclerotic heart disease of native coronary artery without angina pectoris: Secondary | ICD-10-CM

## 2014-08-18 DIAGNOSIS — I480 Paroxysmal atrial fibrillation: Secondary | ICD-10-CM

## 2014-08-18 NOTE — Progress Notes (Signed)
Riley Jordan, [place], [place]  [address] Phone: [phone] Fax:  [phone]  Date:  08/18/2014   ID:  Riley Jordan, DOB [birth year], MRN [id number]  PCP:  Leonard Downing, MD  Cardiologist:  Fransico Him, MD    History of Present Illness: Riley Jordan is a 78 y.o. male with a history of ASCAD, HTN, dyslipidemia, PAF who presents today for followup. He is doing well. He denies any chest pain, SOB, DOE, dizziness, palpitation or syncope. He has chronic LE edema which is stable.  He complains of weakness and fatigue in his legs and is worried that it might be the atorvastatin.    Wt Readings from Last 3 Encounters:  08/18/14 205 lb (92.987 kg)  02/25/14 214 lb (97.07 kg)  08/28/13 208 lb 6.4 oz (94.53 kg)     Past Medical History  Diagnosis Date  . Wears glasses   . Hypothyroidism   . GERD (gastroesophageal reflux disease)   . Anginal pain   . Heart attack 01/05/99  . Type II diabetes mellitus   . History of blood transfusion 1990's    S/P back OR  . Arthritis     "back; hands" (2012-07-15)  . Depression     "wife died 01/05/12" (07-15-2012)  . Diabetic peripheral angiopathy   . High cholesterol     statin intolerant  . CAD (coronary atherosclerotic disease)     s/ PCI left circ/OM 09/1999, cutting balloon PCI mid LAD 08/2001, , chronically untreated tortuous lateral marginal branch, DES to prox to mid LAD and DES to mid circ 10/13  . Hypertension   . PAF (paroxysmal atrial fibrillation)     Current Outpatient Prescriptions  Medication Sig Dispense Refill  . acetaminophen (TYLENOL) 500 MG tablet Take 500-1,000 mg by mouth 2 (two) times daily. Takes 1000mg  in the am and 500mg  in the pm    . amiodarone (PACERONE) 200 MG tablet Take 1 tablet (200 mg total) by mouth daily. 90 tablet 3  . amLODipine (NORVASC) 5 MG tablet Take 1 tablet (5 mg total) by mouth daily. 90 tablet 0  . atorvastatin (LIPITOR) 10 MG tablet Take 1 tablet (10 mg total) by mouth daily.  90 tablet 1  . ezetimibe (ZETIA) 10 MG tablet Take 10 mg by mouth daily.    . ferrous fumarate (HEMOCYTE - 106 MG FE) 325 (106 FE) MG TABS tablet Take 1 tablet by mouth daily.    . insulin NPH-insulin regular (NOVOLIN 70/30) (70-30) 100 UNIT/ML injection Inject 35-50 Units into the skin 2 (two) times daily with a meal. 60 units qAM and 35 units  qPM    . isosorbide mononitrate (IMDUR) 60 MG 24 hr tablet Take 1.5 tablets (90 mg total) by mouth daily. 135 tablet 0  . levothyroxine (SYNTHROID, LEVOTHROID) 175 MCG tablet Take 175 mcg by mouth daily.    . meclizine (ANTIVERT) 25 MG tablet Take 25 mg by mouth at bedtime.     . metFORMIN (GLUCOPHAGE) 1000 MG tablet Take 1,000 mg by mouth 2 (two) times daily with a meal.    . nitroGLYCERIN (NITROSTAT) 0.4 MG SL tablet Place 0.4 mg under the tongue every 5 (five) minutes as needed for chest pain.    . ONE TOUCH ULTRA TEST test strip   0  . pantoprazole (PROTONIX) 40 MG tablet Take 1 tablet (40 mg total) by mouth daily at 6 (six) AM. 30 tablet 11  . warfarin (COUMADIN) 5 MG tablet Take 2.5-5  mg by mouth daily. Alternates daily with 5mg  and 2.5mg      No current facility-administered medications for this visit.    Allergies:   No Known Allergies  Social History:  The patient  reports that he quit smoking about 24 years ago. His smoking use included Cigarettes. He has a 40 pack-year smoking history. He has never used smokeless tobacco. He reports that he drinks alcohol. He reports that he does not use illicit drugs.   Family History:  The patient's family history includes Cancer in his father and mother.   ROS:  Please see the history of present illness.      All other systems reviewed and negative.   PHYSICAL EXAM: VS:  BP 148/90 mmHg  Pulse 64  Ht 6' (1.829 m)  Wt 205 lb (92.987 kg)  BMI 27.80 kg/m2 Well nourished, well developed, in no acute distress HEENT: normal Neck: no JVD Cardiac:  normal S1, S2; RRR; no murmur Lungs:  clear to  auscultation bilaterally, no wheezing, rhonchi or rales Abd: soft, nontender, no hepatomegaly Ext: no edema Skin: warm and dry Neuro:  CNs 2-12 intact, no focal abnormalities noted  EKG:  NSR with nonspecific T wave abnormality  ASSESSMENT AND PLAN:  1. ASCAD with no angina - continue Imdur  - not on ASA due to warfarin 2. HTN borderline control - Continue amlodipine  3. Dyslipidemia - LDL at goal - continue atorvastatin/Zetia  4. PAF maintaining NSR - continue amiodarone and warfarin  - check TSH and PFTs with [facility]        5.  Leg fatigue and weakness that he is concerned may be the statin.  He has not tolerated crestor in the past.  I have asked him to hold the atorvastatin for 2 weeks and call and let me know if the weakness improves.  If it does then he may be a candidate for the PCSK9 drug.  He has 2+ bounding pulses in his distal extremities so I do not think his symptoms are from arterial insufficiency in his legs.    Followup with me in 6 months   Signed, Fransico Him, MD [facility] 08/18/2014 3:59 PM

## 2014-08-18 NOTE — Patient Instructions (Signed)
Your physician has recommended that you have a pulmonary function test. Pulmonary Function Tests are a group of tests that measure how well air moves in and out of your lungs.  Your physician recommends that you have lab work TODAY (TSH).  Your physician wants you to follow-up in: 6 months with Dr. Radford Pax. You will receive a reminder letter in the mail two months in advance. If you don't receive a letter, please call our office to schedule the follow-up appointment.  Please hold your Atorvastatin for for 2 weeks and call [phone] to let us know if your leg fatigue has changed.

## 2014-08-19 LAB — TSH: TSH: 3.71 u[IU]/mL (ref 0.35–4.50)

## 2014-09-01 ENCOUNTER — Telehealth: Payer: Self-pay | Admitting: Cardiology

## 2014-09-01 DIAGNOSIS — E785 Hyperlipidemia, unspecified: Secondary | ICD-10-CM

## 2014-09-01 NOTE — Telephone Encounter (Signed)
New Msg   Patient states he was directed to call and give update on condition of his legs. Patient states after discontinuing Lipitor his legs feel much better. Please contact patient at [phone] to inform whether or not Lipitor will be discontinued forever.

## 2014-09-01 NOTE — Telephone Encounter (Signed)
Pt st his legs no longer feel heavy and he feels much better.  To Dr. Radford Pax to decide about Lipitor.

## 2014-09-02 NOTE — Telephone Encounter (Signed)
Left message to call back  

## 2014-09-02 NOTE — Telephone Encounter (Signed)
Informed patient he is being referred to the Lipid Clinic and to hold Lipitor for now. Patient agrees with treatment plan.  Referral placed and sent to [facility] for scheduling.

## 2014-09-02 NOTE — Telephone Encounter (Signed)
Please refer patient to lipid clinic

## 2014-09-04 ENCOUNTER — Other Ambulatory Visit: Payer: Self-pay | Admitting: Dermatology

## 2014-09-11 ENCOUNTER — Encounter (HOSPITAL_COMMUNITY): Payer: Self-pay | Admitting: Interventional Cardiology

## 2014-09-12 ENCOUNTER — Ambulatory Visit (INDEPENDENT_AMBULATORY_CARE_PROVIDER_SITE_OTHER): Payer: Medicare Other | Admitting: Internal Medicine

## 2014-09-12 DIAGNOSIS — I480 Paroxysmal atrial fibrillation: Secondary | ICD-10-CM

## 2014-09-12 LAB — PULMONARY FUNCTION TEST
DL/VA % pred: 100 %
DL/VA: 4.73 ml/min/mmHg/L
DLCO unc % pred: 87 %
DLCO unc: 30.53 ml/min/mmHg
FEF 25-75 Post: 2.22 L/sec
FEF 25-75 Pre: 2.09 L/sec
FEF2575-%Change-Post: 6 %
FEF2575-%Pred-Post: 94 %
FEF2575-%Pred-Pre: 88 %
FEV1-%CHANGE-POST: 1 %
FEV1-%PRED-POST: 88 %
FEV1-%PRED-PRE: 87 %
FEV1-POST: 2.92 L
FEV1-Pre: 2.87 L
FEV1FVC-%Change-Post: 6 %
FEV1FVC-%Pred-Pre: 103 %
FEV6-%Change-Post: -4 %
FEV6-%Pred-Post: 86 %
FEV6-%Pred-Pre: 89 %
FEV6-PRE: 3.84 L
FEV6-Post: 3.68 L
FEV6FVC-%Change-Post: 0 %
FEV6FVC-%PRED-PRE: 105 %
FEV6FVC-%Pred-Post: 106 %
FVC-%Change-Post: -4 %
FVC-%Pred-Post: 81 %
FVC-%Pred-Pre: 85 %
FVC-Post: 3.68 L
FVC-Pre: 3.87 L
POST FEV1/FVC RATIO: 79 %
POST FEV6/FVC RATIO: 100 %
PRE FEV6/FVC RATIO: 99 %
Pre FEV1/FVC ratio: 74 %
RV % pred: 141 %
RV: 3.8 L
TLC % PRED: 100 %
TLC: 7.5 L

## 2014-09-12 NOTE — Progress Notes (Signed)
PFT done today. 

## 2014-09-15 ENCOUNTER — Telehealth: Payer: Self-pay | Admitting: Cardiology

## 2014-09-15 ENCOUNTER — Ambulatory Visit: Payer: Medicare Other | Admitting: Pharmacist

## 2014-09-15 ENCOUNTER — Encounter: Payer: Self-pay | Admitting: Cardiology

## 2014-09-15 DIAGNOSIS — I480 Paroxysmal atrial fibrillation: Secondary | ICD-10-CM

## 2014-09-15 NOTE — Telephone Encounter (Signed)
New Msg   Pt returning call [phone].

## 2014-09-15 NOTE — Telephone Encounter (Signed)
Left message to call back  

## 2014-09-15 NOTE — Telephone Encounter (Signed)
Patient called back. Informed him of PFT results. Informed patient that repeat PFTs will be done in one year. Ordered for scheduling.

## 2014-09-15 NOTE — Addendum Note (Signed)
Addended by: Harland German A on: 09/15/2014 02:53 PM   Modules accepted: Orders

## 2014-09-15 NOTE — Telephone Encounter (Signed)
Please let patient know that PFTs showed minimal obstructive airway disease.  No effects of amiodarone on lungs.  Repeat scan in 1 year

## 2014-09-15 NOTE — Telephone Encounter (Signed)
This encounter was created in error - please disregard.

## 2014-09-19 ENCOUNTER — Ambulatory Visit (INDEPENDENT_AMBULATORY_CARE_PROVIDER_SITE_OTHER): Payer: Medicare Other | Admitting: Pharmacist

## 2014-09-19 DIAGNOSIS — I2510 Atherosclerotic heart disease of native coronary artery without angina pectoris: Secondary | ICD-10-CM

## 2014-09-19 DIAGNOSIS — E785 Hyperlipidemia, unspecified: Secondary | ICD-10-CM

## 2014-09-19 MED ORDER — PRAVASTATIN SODIUM 20 MG PO TABS: 20.0000 mg | ORAL_TABLET | Freq: Every evening | ORAL | Status: DC

## 2014-09-19 NOTE — Progress Notes (Signed)
Riley Jordan is a 78 yo male referred to [facility] by Dr. Radford Pax. PMH is significant for ASCVD s/p MI in 2000, CAD s/p PCI and DES in 2013, HTN, HLD, and PAF.   Treatment History/Intolerances: Pt has tried Crestor in the past but developed muscle aches. During his visit with Dr. Radford Pax on 08/18/14, pt complained of weakness and fatigue in his legs and was worried that it may be due to his Lipitor. Pt was advised to stop taking Lipitor for 2 weeks. During that time, his leg heaviness went away.  Current Medications: Zetia 10mg  daily and tolerating well  Diet and Exercise: Pt reports eating cheerios for breakfast, a sandwich usually for lunch, and tends to go out to dinner more than he cooks for himself. Over the past week, he states that he has had soup, [place] food, and seafood (which he usually likes fried). Discussed limiting fried foods with pt.   For exercise, pt reports that he likes to work in his yard when it isn't too cold out. Other than that, he does not exercise much; stairs are also difficult for him. Discussed walking around more throughout the day and pt seems agreeable.  Labs: 08/06/14: TC 94, LDL 44, TG 107, HDL 29 However, pt has d/ced his Lipitor since this lipid panel was drawn.  Current Outpatient Prescriptions on File Prior to Visit  Medication Sig Dispense Refill  . acetaminophen (TYLENOL) 500 MG tablet Take 500-1,000 mg by mouth 2 (two) times daily. Takes 1000mg  in the am and 500mg  in the pm    . amiodarone (PACERONE) 200 MG tablet Take 1 tablet (200 mg total) by mouth daily. 90 tablet 3  . amLODipine (NORVASC) 5 MG tablet Take 1 tablet (5 mg total) by mouth daily. 90 tablet 0  . ezetimibe (ZETIA) 10 MG tablet Take 10 mg by mouth daily.    . ferrous fumarate (HEMOCYTE - 106 MG FE) 325 (106 FE) MG TABS tablet Take 1 tablet by mouth daily.    . insulin NPH-insulin regular (NOVOLIN 70/30) (70-30) 100 UNIT/ML injection Inject 35-50 Units into the skin 2 (two) times daily  with a meal. 60 units qAM and 35 units  qPM    . isosorbide mononitrate (IMDUR) 60 MG 24 hr tablet Take 1.5 tablets (90 mg total) by mouth daily. 135 tablet 0  . levothyroxine (SYNTHROID, LEVOTHROID) 175 MCG tablet Take 175 mcg by mouth daily.    . meclizine (ANTIVERT) 25 MG tablet Take 25 mg by mouth at bedtime.     . metFORMIN (GLUCOPHAGE) 1000 MG tablet Take 1,000 mg by mouth 2 (two) times daily with a meal.    . nitroGLYCERIN (NITROSTAT) 0.4 MG SL tablet Place 0.4 mg under the tongue every 5 (five) minutes as needed for chest pain.    . ONE TOUCH ULTRA TEST test strip   0  . pantoprazole (PROTONIX) 40 MG tablet Take 1 tablet (40 mg total) by mouth daily at 6 (six) AM. 30 tablet 11  . warfarin (COUMADIN) 5 MG tablet Take 2.5-5 mg by mouth daily. Alternates daily with 5mg  and 2.5mg      No current facility-administered medications on file prior to visit.    Will start pravastatin 20mg  daily. Discussed with pt that this statin is usually tolerated better than Lipitor and Crestor. Pt was informed to call us if he develops any muscle aches or pains while on pravastatin. Would not be candidate for PCSK-9 inhibitor currently given results of most recent lipid panel.  Lolly Glaus E. Janiylah Hannis, Pharm.D Clinical Pharmacy Resident Pager: [phone] 09/19/2014 3:04 PM

## 2014-09-19 NOTE — Assessment & Plan Note (Signed)
Will start pravastatin 20mg  daily. Discussed with pt that this statin is usually tolerated better than Lipitor and Crestor. Pt was informed to call us if he develops any muscle aches or pains while on pravastatin. Would not be candidate for PCSK-9 inhibitor currently given results of most recent lipid panel.

## 2014-10-08 ENCOUNTER — Other Ambulatory Visit: Payer: Self-pay

## 2014-10-08 MED ORDER — ISOSORBIDE MONONITRATE ER 60 MG PO TB24: 90.0000 mg | ORAL_TABLET | Freq: Every day | ORAL | Status: DC

## 2014-11-03 ENCOUNTER — Telehealth: Payer: Self-pay

## 2014-11-03 NOTE — Telephone Encounter (Signed)
Patient called for samples of zetia placed samples up front 

## 2014-11-19 ENCOUNTER — Encounter: Payer: Self-pay | Admitting: Cardiology

## 2014-11-21 ENCOUNTER — Other Ambulatory Visit: Payer: Self-pay

## 2014-11-21 MED ORDER — AMLODIPINE BESYLATE 5 MG PO TABS: 5.0000 mg | ORAL_TABLET | Freq: Every day | ORAL | Status: DC

## 2014-12-10 ENCOUNTER — Telehealth: Payer: Self-pay | Admitting: Cardiology

## 2014-12-10 NOTE — Telephone Encounter (Signed)
Spoke with Myriam Jacobson, at [place]. She states pt calling to request Tier exception for pravastatin and isosorbide. After being on the phone with her for 12 minutes she told me that she could not help me and she would transfer me to a staff member, Almyra Free that could get clinical information from me to complete the request for Tier exception.

## 2014-12-10 NOTE — Telephone Encounter (Signed)
I was transferred to [place].  Almyra Free was unable to complete prior authorization request over the telephone. Almyra Free states she is going to fax Tier exception request form to Dr Radford Pax so request for Tier exception for isosorbide and pravastatin can be completed.

## 2014-12-10 NOTE — Telephone Encounter (Signed)
New message   [employer] calling    Need two tier expectation for medication.    1. Pravastatin  2. Isosorbide

## 2014-12-11 NOTE — Telephone Encounter (Signed)
F/u    Wanting to know status of authorization.. Please advise

## 2014-12-12 ENCOUNTER — Telehealth: Payer: Self-pay | Admitting: Cardiology

## 2014-12-12 NOTE — Telephone Encounter (Signed)
Informed [employer] representative that no PA for levothyroxine was sent from our office. She verified that the PA was sent by Dr. Chalmers Cater. Verified pravastatin was approved. Representative st PA for Imdur was denied.  Filled out paperwork again and faxed to [phone] per rep's instruction.

## 2014-12-12 NOTE — Telephone Encounter (Signed)
Rose from refills informed me that appeal for Imdur was denied and more clinical information is needed. Printed off 2 last OV notes and faxed. Awaiting approval or denial.

## 2014-12-12 NOTE — Telephone Encounter (Signed)
Tier exception forms faxed to [facility].

## 2014-12-12 NOTE — Telephone Encounter (Signed)
New Msg      [place] with [employer] calling to report the following medications have been approved:  Pravastatin, Levothyroxine tabs, and Levothyroxine sodium 175 mcg tablets.  All are now tier 1 drugs and approval letters will be mailed.   Please call if any questions [phone].

## 2014-12-15 ENCOUNTER — Telehealth: Payer: Self-pay | Admitting: Cardiology

## 2014-12-15 NOTE — Telephone Encounter (Signed)
Spoke with Amado Nash Rep, who explained that both Isosorbide and Pravastatin had been previously approved. Unable to determine at this time why appeal was filed regarding Tier Exception. Rep states he can fax letter showing where medications had been approved and he will also fax an Appeal Withdrawal form, since this needs to be completed by 11 am today in order for appeal to be withdrawn and medication approvals reactivated. Awaiting fax at this time. Fax rec'd from Gregory/[employer] of Medicare Appeals and Quality Dept. Letters indicating that both Pravastatin and Imdur were already approved previously for this patient and remain approved. Appeal is being withdrawn since patient is approved to receive these medications. Paged Dr. Radford Pax, however she is not in clinic today, so will route this message as FYI. Notified patient that both medications have been approved at Tier 1. Letters will be scanned and placed in file/chart so that we will have this information for reference. Patient appreciative for our assistance. Called Gregory/[employer] back and left VM requesting a call back if any further action is needed on this patient.

## 2014-12-15 NOTE — Telephone Encounter (Signed)
New message     Need to talk to a nurse ASAP regarding what type of appeal we are filing.  They must hear from [place] by 11am or everything will be denied. Please call

## 2014-12-22 ENCOUNTER — Telehealth: Payer: Self-pay | Admitting: *Deleted

## 2014-12-22 NOTE — Telephone Encounter (Signed)
Zetia samples placed at the front desk for patient. 

## 2014-12-29 NOTE — Telephone Encounter (Signed)
Called and spoke with [employer] about PA for isosorbide. After investigating, they found the PA to have been approved as of January 2016 for one year.

## 2015-01-06 ENCOUNTER — Telehealth: Payer: Self-pay | Admitting: *Deleted

## 2015-01-06 NOTE — Telephone Encounter (Signed)
Zetia samples placed at the front desk for patient. 

## 2015-02-12 ENCOUNTER — Other Ambulatory Visit: Payer: Self-pay

## 2015-02-12 DIAGNOSIS — I480 Paroxysmal atrial fibrillation: Secondary | ICD-10-CM

## 2015-02-12 MED ORDER — AMIODARONE HCL 200 MG PO TABS: 200.0000 mg | ORAL_TABLET | Freq: Every day | ORAL | Status: DC

## 2015-02-14 ENCOUNTER — Encounter: Payer: Self-pay | Admitting: Cardiology

## 2015-02-14 NOTE — Progress Notes (Signed)
Cardiology Office Note   Date:  02/16/2015   ID:  Riley Jordan, DOB Jan 21, 1936, MRN [id number]  PCP:  Riley Downing, MD    Chief Complaint  Patient presents with  . Coronary Artery Disease  . Follow-up    hypertension  . Follow-up    hyperlipidemia      History of Present Illness: Riley Jordan is a 79 y.o. male with a history of ASCAD, HTN, dyslipidemia, PAF who presents today for followup. He is doing well. He denies any chest pain, SOB, DOE, dizziness, palpitation or syncope. He has chronic LE edema which is stable. When he last saw me he was complaining  of weakness and fatigue in his legs and is worried that it might be the atorvastatin and he held it for 2 weeks.  His symptoms improved so the statin was stopped and he was referred to lipid clinic.  He was changed to Pravastatin and he is tolerating it well with no leg pain.       Past Medical History  Diagnosis Date  . Wears glasses   . Hypothyroidism   . GERD (gastroesophageal reflux disease)   . Anginal pain   . Heart attack 2000  . Type II diabetes mellitus   . History of blood transfusion 1990's    S/P back OR  . Arthritis     "back; hands" (2012-07-22)  . Depression     "wife died 10-Feb-2012" (2012/07/22)  . Diabetic peripheral angiopathy   . CAD (coronary atherosclerotic disease)     s/ PCI left circ/OM 09/1999, cutting balloon PCI mid LAD 08/2001, , chronically untreated tortuous lateral marginal branch, DES to prox to mid LAD and DES to mid circ 10/13  . Hypertension   . PAF (paroxysmal atrial fibrillation)   . High cholesterol     intolerant to Lipitor/Crestor    Past Surgical History  Procedure Laterality Date  . Coronary angioplasty with stent placement  2000; 07/22/2012    "3 + 2; total of 5" (2012/07/22)  . Cholecystectomy  2012  . Back surgery    . Back surgery    . Lumbar disc surgery  1997  . Posterior fusion lumbar spine  ~ 1998  . Percutaneous coronary stent intervention  (pci-s) N/A 22-Jul-2012    Procedure: PERCUTANEOUS CORONARY STENT INTERVENTION (PCI-S);  Surgeon: Jettie Booze, MD;  Location: [facility] CATH LAB;  Service: Cardiovascular;  Laterality: N/A;     Current Outpatient Prescriptions  Medication Sig Dispense Refill  . acetaminophen (TYLENOL) 500 MG tablet Take 500-1,000 mg by mouth 2 (two) times daily. Takes 1000mg  in the am and 500mg  in the pm    . amiodarone (PACERONE) 200 MG tablet Take 1 tablet (200 mg total) by mouth daily. 90 tablet 3  . amLODipine (NORVASC) 5 MG tablet Take 1 tablet (5 mg total) by mouth daily. 90 tablet 3  . ezetimibe (ZETIA) 10 MG tablet Take 10 mg by mouth daily.    . ferrous fumarate (HEMOCYTE - 106 MG FE) 325 (106 FE) MG TABS tablet Take 1 tablet by mouth daily.    . insulin NPH-insulin regular (NOVOLIN 70/30) (70-30) 100 UNIT/ML injection Inject 35-50 Units into the skin 2 (two) times daily with a meal. 45 units qAM, 20 units at lunch and 18 units  qPM    . isosorbide mononitrate (IMDUR) 60 MG 24 hr tablet Take 1.5 tablets (90 mg total) by mouth daily. 135 tablet 1  . levothyroxine (SYNTHROID, LEVOTHROID) 175  MCG tablet Take 175 mcg by mouth daily.    . meclizine (ANTIVERT) 25 MG tablet Take 25 mg by mouth at bedtime.     . metFORMIN (GLUCOPHAGE) 1000 MG tablet Take 1,000 mg by mouth 2 (two) times daily with a meal.    . nitroGLYCERIN (NITROSTAT) 0.4 MG SL tablet Place 0.4 mg under the tongue every 5 (five) minutes as needed for chest pain.    . ONE TOUCH ULTRA TEST test strip   0  . pantoprazole (PROTONIX) 40 MG tablet Take 1 tablet (40 mg total) by mouth daily at 6 (six) AM. 30 tablet 11  . pravastatin (PRAVACHOL) 20 MG tablet Take 1 tablet (20 mg total) by mouth every evening. 90 tablet 3  . warfarin (COUMADIN) 5 MG tablet Take 2.5-5 mg by mouth daily. Alternates daily with 5mg  and 2.5mg      No current facility-administered medications for this visit.    Allergies:   Review of patient's allergies indicates no known  allergies.    Social History:  The patient  reports that he quit smoking about 25 years ago. His smoking use included Cigarettes. He has a 40 pack-year smoking history. He has never used smokeless tobacco. He reports that he drinks alcohol. He reports that he does not use illicit drugs.   Family History:  The patient's family history includes Cancer in his father and mother.    ROS:  Please see the history of present illness.   Otherwise, review of systems are positive for none.   All other systems are reviewed and negative.    PHYSICAL EXAM: VS:  BP 140/80 mmHg  Pulse 67  Ht 6' (1.829 m)  Wt 204 lb 12.8 oz (92.897 kg)  BMI 27.77 kg/m2  SpO2 97% , BMI Body mass index is 27.77 kg/(m^2). GEN: Well nourished, well developed, in no acute distress HEENT: normal Neck: no JVD, carotid bruits, or masses Cardiac: RRR; no murmurs, rubs, or gallops,no edema  Respiratory:  clear to auscultation bilaterally, normal work of breathing GI: soft, nontender, nondistended, + BS MS: no deformity or atrophy Skin: warm and dry, no rash Neuro:  Strength and sensation are intact Psych: euthymic mood, full affect   EKG:  EKG is not ordered today.    Recent Labs: 08/06/2014: ALT 18 08/18/2014: TSH 3.71    Lipid Panel    Component Value Date/Time   CHOL 94 08/06/2014 0824   TRIG 107.0 08/06/2014 0824   HDL 29.00* 08/06/2014 0824   CHOLHDL 3 08/06/2014 0824   VLDL 21.4 08/06/2014 0824   LDLCALC 44 08/06/2014 0824      Wt Readings from Last 3 Encounters:  02/16/15 204 lb 12.8 oz (92.897 kg)  08/18/14 205 lb (92.987 kg)  02/25/14 214 lb (97.07 kg)    ASSESSMENT AND PLAN:  1. ASCAD with no angina - continue Imdur  - not on ASA due to warfarin 2. HTN controlled - Continue amlodipine  3. Dyslipidemia - LDL at goal - continue pravastatin/Zetia  - check FLP and ALT 4. PAF maintaining NSR - continue amiodarone and warfarin   5. Leg fatigue and weakness resolved after  stopping Lipitor.  He is now on Pravastatin and tolerating well       6.  Heart murmur - I will get an echo to reassess the AV   Current medicines are reviewed at length with the patient today.  The patient does not have concerns regarding medicines.  The following changes have been made:  no  change  Labs/ tests ordered today include: see above assessment and plan No orders of the defined types were placed in this encounter.     Disposition:   FU with me in 6 months   Signed, [name], MD  02/16/2015 1:25 PM    [facility] [place], [place], [place]  [address] Phone: [phone]; Fax: [phone]

## 2015-02-16 ENCOUNTER — Ambulatory Visit (INDEPENDENT_AMBULATORY_CARE_PROVIDER_SITE_OTHER): Payer: Medicare Other | Admitting: Cardiology

## 2015-02-16 ENCOUNTER — Encounter: Payer: Self-pay | Admitting: Cardiology

## 2015-02-16 VITALS — BP 140/80 | HR 67 | Ht 72.0 in | Wt 204.8 lb

## 2015-02-16 DIAGNOSIS — I2510 Atherosclerotic heart disease of native coronary artery without angina pectoris: Secondary | ICD-10-CM | POA: Diagnosis not present

## 2015-02-16 DIAGNOSIS — I480 Paroxysmal atrial fibrillation: Secondary | ICD-10-CM | POA: Diagnosis not present

## 2015-02-16 DIAGNOSIS — R011 Cardiac murmur, unspecified: Secondary | ICD-10-CM

## 2015-02-16 DIAGNOSIS — I10 Essential (primary) hypertension: Secondary | ICD-10-CM

## 2015-02-16 DIAGNOSIS — E785 Hyperlipidemia, unspecified: Secondary | ICD-10-CM | POA: Diagnosis not present

## 2015-02-16 NOTE — Patient Instructions (Addendum)
Medication Instructions:  Your physician recommends that you continue on your current medications as directed. Please refer to the Current Medication list given to you today.   Labwork: You have a FASTING lab appointment THE SAME DAY AS YOUR ECHO. You may come ANY TIME between 7:30 AM and 5:00 PM.  Testing/Procedures: Your physician has requested that you have an echocardiogram. Echocardiography is a painless test that uses sound waves to create images of your heart. It provides your doctor with information about the size and shape of your heart and how well your heart's chambers and valves are working. This procedure takes approximately one hour. There are no restrictions for this procedure.  Follow-Up: Your physician wants you to follow-up in: 6 months with Dr. Radford Pax. You will receive a reminder letter in the mail two months in advance. If you don't receive a letter, please call our office to schedule the follow-up appointment.

## 2015-02-17 ENCOUNTER — Other Ambulatory Visit: Payer: Medicare Other

## 2015-02-18 ENCOUNTER — Other Ambulatory Visit (INDEPENDENT_AMBULATORY_CARE_PROVIDER_SITE_OTHER): Payer: Medicare Other | Admitting: *Deleted

## 2015-02-18 ENCOUNTER — Ambulatory Visit (HOSPITAL_COMMUNITY): Payer: Medicare Other | Attending: Cardiovascular Disease

## 2015-02-18 DIAGNOSIS — R011 Cardiac murmur, unspecified: Secondary | ICD-10-CM

## 2015-02-18 DIAGNOSIS — E785 Hyperlipidemia, unspecified: Secondary | ICD-10-CM | POA: Insufficient documentation

## 2015-02-18 LAB — HEPATIC FUNCTION PANEL
ALK PHOS: 58 U/L (ref 39–117)
ALT: 19 U/L (ref 0–53)
AST: 22 U/L (ref 0–37)
Albumin: 3.9 g/dL (ref 3.5–5.2)
BILIRUBIN TOTAL: 1.1 mg/dL (ref 0.2–1.2)
Bilirubin, Direct: 0.2 mg/dL (ref 0.0–0.3)
TOTAL PROTEIN: 6.7 g/dL (ref 6.0–8.3)

## 2015-02-18 LAB — LIPID PANEL
Cholesterol: 92 mg/dL (ref 0–200)
HDL: 30.2 mg/dL — ABNORMAL LOW (ref >39.00)
LDL Cholesterol: 35 mg/dL (ref 0–99)
NONHDL: 61.8
Total CHOL/HDL Ratio: 3
Triglycerides: 132 mg/dL (ref 0.0–149.0)
VLDL: 26.4 mg/dL (ref 0.0–40.0)

## 2015-03-26 ENCOUNTER — Other Ambulatory Visit: Payer: Self-pay

## 2015-03-26 MED ORDER — ISOSORBIDE MONONITRATE ER 60 MG PO TB24: 90.0000 mg | ORAL_TABLET | Freq: Every day | ORAL | Status: DC

## 2015-04-13 ENCOUNTER — Telehealth: Payer: Self-pay | Admitting: Cardiology

## 2015-04-13 DIAGNOSIS — E785 Hyperlipidemia, unspecified: Secondary | ICD-10-CM

## 2015-04-13 NOTE — Telephone Encounter (Signed)
Pt calls stating that Zetia is too expensive and wants to know what Dr. Radford Pax would recommend instead of the Zetia. Pt states that he has enough to last him until Friday. Will forward to Dr. Radford Pax for review and advisement.

## 2015-04-13 NOTE — Telephone Encounter (Signed)
Stop zetia and increase pravastatin to 40mg  daily and recheck FLP and ALT in 6 weeks

## 2015-04-13 NOTE — Telephone Encounter (Signed)
Attempted to call pt and recording came on stating "The [employer] customer you are trying to reach is unavailable at time. Please try your call again later." Unable to leave a voicemail. Will try again later.

## 2015-04-13 NOTE — Telephone Encounter (Signed)
New message     Patient calling unable of get sample of zetia. Wants to know what's the next step.    Due to cost of medicaton.

## 2015-04-14 MED ORDER — PRAVASTATIN SODIUM 40 MG PO TABS: 40.0000 mg | ORAL_TABLET | Freq: Every evening | ORAL | Status: DC

## 2015-04-14 NOTE — Telephone Encounter (Signed)
Follow up ° ° ° ° ° °Returning Riley Jordan's call °

## 2015-04-14 NOTE — Telephone Encounter (Signed)
Instructed patient to STOP ZETIA and to INCREASE PRAVASTATIN to 40 mg daily. FLP and ALT scheduled for 9/16. Patient agrees with treatment plan.

## 2015-04-14 NOTE — Telephone Encounter (Signed)
Left message to call back  

## 2015-06-19 ENCOUNTER — Other Ambulatory Visit (INDEPENDENT_AMBULATORY_CARE_PROVIDER_SITE_OTHER): Payer: Medicare Other | Admitting: *Deleted

## 2015-06-19 DIAGNOSIS — E785 Hyperlipidemia, unspecified: Secondary | ICD-10-CM | POA: Diagnosis not present

## 2015-06-19 LAB — LIPID PANEL
Cholesterol: 102 mg/dL (ref 0–200)
HDL: 30.9 mg/dL — ABNORMAL LOW (ref >39.00)
LDL Cholesterol: 48 mg/dL (ref 0–99)
NonHDL: 70.94
TRIGLYCERIDES: 113 mg/dL (ref 0.0–149.0)
Total CHOL/HDL Ratio: 3
VLDL: 22.6 mg/dL (ref 0.0–40.0)

## 2015-06-19 LAB — ALT: ALT: 15 U/L (ref 0–53)

## 2015-06-22 ENCOUNTER — Telehealth: Payer: Self-pay | Admitting: Cardiology

## 2015-06-22 NOTE — Telephone Encounter (Signed)
F/u       Pt returning Katy's phone call.

## 2015-06-22 NOTE — Telephone Encounter (Signed)
Informed patient of results and verbal understanding expressed.  

## 2015-06-22 NOTE — Telephone Encounter (Signed)
-----   Message from [name], MD sent at 06/20/2015 10:34 PM EDT ----- Lipids at goal continue current therapy

## 2015-08-16 NOTE — Progress Notes (Signed)
Cardiology Office Note   Date:  08/17/2015   ID:  Riley Jordan, DOB 1936/06/27, MRN [id number]  PCP:  Leonard Downing, MD    Chief Complaint  Patient presents with  . Coronary Artery Disease  . Hypertension  . Hyperlipidemia      History of Present Illness: Riley Jordan is a 79 y.o. male with a history of ASCAD, HTN, dyslipidemia, PAF who presents today for followup. He is doing well. He denies any chest pain, SOB, DOE, LE edema, palpitation or syncope.   He has some dizziness occasionally when going from sitting to standing.  He walks for exercise and works in the yard.  Yesterday he had an episode of jaw pain when he was working outside which was identical to the discomfort he had when his CAD was first diagnosed.     Past Medical History  Diagnosis Date  . Wears glasses   . Hypothyroidism   . GERD (gastroesophageal reflux disease)   . Anginal pain ([place])   . Heart attack ([place]) 01-30-99  . Type II diabetes mellitus ([place])   . History of blood transfusion 1990's    S/P back OR  . Arthritis     "back; hands" (08-Aug-2012)  . Depression     "wife died 2012-01-30" (08/08/2012)  . Diabetic peripheral angiopathy ([place])   . CAD (coronary atherosclerotic disease)     s/ PCI left circ/OM 09/1999, cutting balloon PCI mid LAD 08/2001, , chronically untreated tortuous lateral marginal branch, DES to prox to mid LAD and DES to mid circ 10/13  . Hypertension   . PAF (paroxysmal atrial fibrillation) ([place])   . High cholesterol     intolerant to Lipitor/Crestor    Past Surgical History  Procedure Laterality Date  . Coronary angioplasty with stent placement  January 30, 1999; Aug 08, 2012    "3 + 2; total of 5" (08-08-2012)  . Cholecystectomy  01-30-2011  . Back surgery    . Back surgery    . Lumbar disc surgery  1997  . Posterior fusion lumbar spine  ~ 1998  . Percutaneous coronary stent intervention (pci-s) N/A 08-08-2012    Procedure: PERCUTANEOUS CORONARY STENT INTERVENTION  (PCI-S);  Surgeon: Jettie Booze, MD;  Location: [facility] CATH LAB;  Service: Cardiovascular;  Laterality: N/A;     Current Outpatient Prescriptions  Medication Sig Dispense Refill  . acetaminophen (TYLENOL) 500 MG tablet Take 500-1,000 mg by mouth 2 (two) times daily. Takes 1000mg  in the am and 500mg  in the pm    . amiodarone (PACERONE) 200 MG tablet Take 1 tablet (200 mg total) by mouth daily. 90 tablet 3  . amLODipine (NORVASC) 5 MG tablet Take 1 tablet (5 mg total) by mouth daily. 90 tablet 3  . ferrous fumarate (HEMOCYTE - 106 MG FE) 325 (106 FE) MG TABS tablet Take 1 tablet by mouth daily.    . hydrochlorothiazide (HYDRODIURIL) 50 MG tablet Take 1 tablet by mouth daily.  0  . insulin NPH-insulin regular (NOVOLIN 70/30) (70-30) 100 UNIT/ML injection Inject 35-50 Units into the skin 2 (two) times daily with a meal. 45 units qAM, 20 units at lunch and 18 units  qPM    . isosorbide mononitrate (IMDUR) 60 MG 24 hr tablet Take 1.5 tablets (90 mg total) by mouth daily. 135 tablet 2  . levothyroxine (SYNTHROID, LEVOTHROID) 175 MCG tablet Take 175 mcg by mouth daily.    Marland Kitchen  meclizine (ANTIVERT) 25 MG tablet Take 25 mg by mouth at bedtime.     . metFORMIN (GLUCOPHAGE) 1000 MG tablet Take 1,000 mg by mouth 2 (two) times daily with a meal.    . nitroGLYCERIN (NITROSTAT) 0.4 MG SL tablet Place 0.4 mg under the tongue every 5 (five) minutes as needed for chest pain.    . ONE TOUCH ULTRA TEST test strip   0  . pantoprazole (PROTONIX) 40 MG tablet Take 1 tablet (40 mg total) by mouth daily at 6 (six) AM. 30 tablet 11  . pravastatin (PRAVACHOL) 40 MG tablet Take 1 tablet (40 mg total) by mouth every evening. 90 tablet 2  . warfarin (COUMADIN) 5 MG tablet Take 2.5-5 mg by mouth daily. Alternates daily with 5mg  and 2.5mg      No current facility-administered medications for this visit.    Allergies:   Review of patient's allergies indicates no known allergies.    Social History:  The patient  reports  that he quit smoking about 25 years ago. His smoking use included Cigarettes. He has a 40 pack-year smoking history. He has never used smokeless tobacco. He reports that he drinks alcohol. He reports that he does not use illicit drugs.   Family History:  The patient's family history includes Cancer in his father and mother.    ROS:  Please see the history of present illness.   Otherwise, review of systems are positive for none.   All other systems are reviewed and negative.    PHYSICAL EXAM: VS:  BP 128/80 mmHg  Pulse 62  Ht 6\' 1"  (1.854 m)  Wt 195 lb 3.2 oz (88.542 kg)  BMI 25.76 kg/m2 , BMI Body mass index is 25.76 kg/(m^2). GEN: Well nourished, well developed, in no acute distress HEENT: normal Neck: no JVD, carotid bruits, or masses Cardiac: RRR; no murmurs, rubs, or gallops,no edema  Respiratory:  clear to auscultation bilaterally, normal work of breathing GI: soft, nontender, nondistended, + BS MS: no deformity or atrophy Skin: warm and dry, no rash Neuro:  Strength and sensation are intact Psych: euthymic mood, full affect   EKG:  EKG is ordered today. The ekg ordered today demonstrates NSR at 62bpm with nonspecific ST abnormality   Recent Labs: 08/18/2014: TSH 3.71 06/19/2015: ALT 15    Lipid Panel    Component Value Date/Time   CHOL 102 06/19/2015 0731   TRIG 113.0 06/19/2015 0731   HDL 30.90* 06/19/2015 0731   CHOLHDL 3 06/19/2015 0731   VLDL 22.6 06/19/2015 0731   LDLCALC 48 06/19/2015 0731      Wt Readings from Last 3 Encounters:  08/17/15 195 lb 3.2 oz (88.542 kg)  02/16/15 204 lb 12.8 oz (92.897 kg)  08/18/14 205 lb (92.987 kg)     ASSESSMENT AND PLAN:  1. ASCAD with exertional jaw pain that was his first symptom with his CAD in the past.   - continue Imdur/statin  - not on ASA due to warfarin - check a Stress myoview to rule out ischemia 2. HTN controlled - Continue amlodipine/HCTZ  3. Dyslipidemia - LDL at goal at 48 - continue  pravastatin 4. PAF maintaining NSR - continue amiodarone and warfarin  - check TSH/PFTs with [facility] for chronic monitoring for amio toxicity  5.  Heart murmur - with AV sclerosis on echo 02/2015   Current medicines are reviewed at length with the patient today.  The patient does not have concerns regarding medicines.  The following changes have been made:  no change  Labs/ tests ordered today: See above Assessment and Plan No orders of the defined types were placed in this encounter.     Disposition:   FU with me in 6 months  Signed, [name], MD  08/17/2015 8:56 AM    [facility] [place], [place], [place]  [address] Phone: [phone]; Fax: [phone]

## 2015-08-17 ENCOUNTER — Telehealth (HOSPITAL_COMMUNITY): Payer: Self-pay | Admitting: *Deleted

## 2015-08-17 ENCOUNTER — Encounter: Payer: Self-pay | Admitting: Cardiology

## 2015-08-17 ENCOUNTER — Ambulatory Visit (INDEPENDENT_AMBULATORY_CARE_PROVIDER_SITE_OTHER): Payer: Medicare Other | Admitting: Cardiology

## 2015-08-17 VITALS — BP 128/80 | HR 62 | Ht 73.0 in | Wt 195.2 lb

## 2015-08-17 DIAGNOSIS — E785 Hyperlipidemia, unspecified: Secondary | ICD-10-CM

## 2015-08-17 DIAGNOSIS — I10 Essential (primary) hypertension: Secondary | ICD-10-CM

## 2015-08-17 DIAGNOSIS — I480 Paroxysmal atrial fibrillation: Secondary | ICD-10-CM | POA: Diagnosis not present

## 2015-08-17 DIAGNOSIS — R6884 Jaw pain: Secondary | ICD-10-CM

## 2015-08-17 DIAGNOSIS — I2510 Atherosclerotic heart disease of native coronary artery without angina pectoris: Secondary | ICD-10-CM

## 2015-08-17 LAB — TSH: TSH: 0.721 u[IU]/mL (ref 0.350–4.500)

## 2015-08-17 NOTE — Patient Instructions (Signed)
Medication Instructions:  Your physician recommends that you continue on your current medications as directed. Please refer to the Current Medication list given to you today.   Labwork: TODAY: TSH  Testing/Procedures: Dr. Radford Pax recommends you have an [place].  Your physician has recommended that you have a pulmonary function test. Pulmonary Function Tests are a group of tests that measure how well air moves in and out of your lungs.  Follow-Up: Your physician wants you to follow-up in: 6 months with Dr. Radford Pax. You will receive a reminder letter in the mail two months in advance. If you don't receive a letter, please call our office to schedule the follow-up appointment.   Any Other Special Instructions Will Be Listed Below (If Applicable).     If you need a refill on your cardiac medications before your next appointment, please call your pharmacy.

## 2015-08-17 NOTE — Telephone Encounter (Signed)
Patient given detailed instructions per Myocardial Perfusion Study Information Sheet for the test on 08/18/15 at 0730. Patient notified to arrive 15 minutes early and that it is imperative to arrive on time for appointment to keep from having the test rescheduled.  If you need to cancel or reschedule your appointment, please call the office within 24 hours of your appointment. Failure to do so may result in a cancellation of your appointment, and a $50 no show fee. Patient verbalized understanding.Suren Payne, [place]anae [place]

## 2015-08-18 ENCOUNTER — Ambulatory Visit (INDEPENDENT_AMBULATORY_CARE_PROVIDER_SITE_OTHER): Payer: Medicare Other | Admitting: Internal Medicine

## 2015-08-18 DIAGNOSIS — I480 Paroxysmal atrial fibrillation: Secondary | ICD-10-CM | POA: Diagnosis not present

## 2015-08-18 LAB — PULMONARY FUNCTION TEST
DL/VA % PRED: 93 %
DL/VA: 4.38 ml/min/mmHg/L
DLCO UNC % PRED: 78 %
DLCO UNC: 27.38 ml/min/mmHg
FEF 25-75 POST: 2.08 L/s
FEF 25-75 Pre: 1.88 L/sec
FEF2575-%Change-Post: 10 %
FEF2575-%PRED-PRE: 80 %
FEF2575-%Pred-Post: 89 %
FEV1-%Change-Post: 2 %
FEV1-%Pred-Post: 86 %
FEV1-%Pred-Pre: 83 %
FEV1-Post: 2.8 L
FEV1-Pre: 2.73 L
FEV1FVC-%Change-Post: 3 %
FEV1FVC-%PRED-PRE: 101 %
FEV6-%CHANGE-POST: 0 %
FEV6-%PRED-PRE: 86 %
FEV6-%Pred-Post: 86 %
FEV6-Post: 3.67 L
FEV6-Pre: 3.67 L
FEV6FVC-%CHANGE-POST: 1 %
FEV6FVC-%PRED-POST: 106 %
FEV6FVC-%Pred-Pre: 104 %
FVC-%Change-Post: -1 %
FVC-%Pred-Post: 81 %
FVC-%Pred-Pre: 82 %
FVC-Post: 3.68 L
FVC-Pre: 3.73 L
POST FEV1/FVC RATIO: 76 %
PRE FEV1/FVC RATIO: 73 %
Post FEV6/FVC ratio: 100 %
Pre FEV6/FVC Ratio: 98 %
RV % pred: 123 %
RV: 3.34 L
TLC % PRED: 94 %
TLC: 7.04 L

## 2015-08-18 NOTE — Progress Notes (Signed)
PFT done today. 

## 2015-08-19 ENCOUNTER — Telehealth: Payer: Self-pay

## 2015-08-19 DIAGNOSIS — I480 Paroxysmal atrial fibrillation: Secondary | ICD-10-CM

## 2015-08-19 NOTE — Telephone Encounter (Signed)
Informed patient of results and verbal understanding expressed.  Repeat PFTs ordered to be scheduled in 1 year. Patient agrees with treatment plan. 

## 2015-08-19 NOTE — Telephone Encounter (Signed)
-----   Message from [name], MD sent at 08/18/2015  7:58 PM EST ----- Stable PFTS - repeat in 1 year with [facility]

## 2015-08-24 ENCOUNTER — Telehealth (HOSPITAL_COMMUNITY): Payer: Self-pay | Admitting: *Deleted

## 2015-08-24 NOTE — Telephone Encounter (Signed)
Patient given detailed instructions per Myocardial Perfusion Study Information Sheet for the test on 08/31/15 at 0745. Patient notified to arrive 15 minutes early and that it is imperative to arrive on time for appointment to keep from having the test rescheduled.  If you need to cancel or reschedule your appointment, please call the office within 24 hours of your appointment. Failure to do so may result in a cancellation of your appointment, and a $50 no show fee. Patient verbalized understanding.Marquisha Nikolov, [place]anae [place]

## 2015-08-31 ENCOUNTER — Ambulatory Visit (HOSPITAL_COMMUNITY): Payer: Medicare Other | Attending: Internal Medicine

## 2015-08-31 VITALS — Ht 72.0 in | Wt 195.0 lb

## 2015-08-31 DIAGNOSIS — R42 Dizziness and giddiness: Secondary | ICD-10-CM | POA: Insufficient documentation

## 2015-08-31 DIAGNOSIS — E119 Type 2 diabetes mellitus without complications: Secondary | ICD-10-CM | POA: Insufficient documentation

## 2015-08-31 DIAGNOSIS — R0602 Shortness of breath: Secondary | ICD-10-CM

## 2015-08-31 DIAGNOSIS — R6884 Jaw pain: Secondary | ICD-10-CM | POA: Diagnosis not present

## 2015-08-31 DIAGNOSIS — I2510 Atherosclerotic heart disease of native coronary artery without angina pectoris: Secondary | ICD-10-CM | POA: Diagnosis present

## 2015-08-31 DIAGNOSIS — R9439 Abnormal result of other cardiovascular function study: Secondary | ICD-10-CM | POA: Diagnosis not present

## 2015-08-31 DIAGNOSIS — I10 Essential (primary) hypertension: Secondary | ICD-10-CM | POA: Insufficient documentation

## 2015-08-31 LAB — MYOCARDIAL PERFUSION IMAGING
CHL CUP NUCLEAR SSS: 12
CSEPPHR: 83 {beats}/min
LHR: 0.28
LVDIAVOL: 139 mL
LVSYSVOL: 49 mL
NUC STRESS TID: 1.04
Rest HR: 73 {beats}/min
SDS: 1
SRS: 11

## 2015-08-31 MED ORDER — REGADENOSON 0.4 MG/5ML IV SOLN
0.4000 mg | Freq: Once | INTRAVENOUS | Status: AC
  Administered 2015-08-31: 0.4 mg via INTRAVENOUS

## 2015-08-31 MED ORDER — TECHNETIUM TC 99M SESTAMIBI GENERIC - CARDIOLITE
30.9000 | Freq: Once | INTRAVENOUS | Status: AC | PRN
  Administered 2015-08-31: 30.9 via INTRAVENOUS

## 2015-08-31 MED ORDER — AMINOPHYLLINE 25 MG/ML IV SOLN
75.0000 mg | Freq: Two times a day (BID) | INTRAVENOUS | Status: AC | PRN
  Administered 2015-08-31: 75 mg via INTRAVENOUS

## 2015-08-31 MED ORDER — TECHNETIUM TC 99M SESTAMIBI GENERIC - CARDIOLITE
11.0000 | Freq: Once | INTRAVENOUS | Status: AC | PRN
  Administered 2015-08-31: 11 via INTRAVENOUS

## 2015-09-02 ENCOUNTER — Telehealth: Payer: Self-pay

## 2015-09-02 ENCOUNTER — Other Ambulatory Visit: Payer: Self-pay | Admitting: Cardiology

## 2015-09-02 ENCOUNTER — Telehealth: Payer: Self-pay | Admitting: Nurse Practitioner

## 2015-09-02 DIAGNOSIS — R9439 Abnormal result of other cardiovascular function study: Secondary | ICD-10-CM

## 2015-09-02 DIAGNOSIS — I480 Paroxysmal atrial fibrillation: Secondary | ICD-10-CM

## 2015-09-02 DIAGNOSIS — Z01812 Encounter for preprocedural laboratory examination: Secondary | ICD-10-CM

## 2015-09-02 NOTE — Telephone Encounter (Signed)
Informed patient of results and verbal understanding expressed.  L heart catheterization scheduled for 09/16/15 with Dr. Angelena Form.  Patient to come 12/12 for pre-cath lab work and to pick up instruction letter. Patient understands to hold coumadin for 5 days prior to catheterization.   Dr. Arelia Sneddon' office notified by phone and by fax.

## 2015-09-02 NOTE — Telephone Encounter (Signed)
-----   Message from Leeroy Bock, [facility] sent at 09/01/2015  4:53 PM EST ----- Patient has CHADS2 score of 3, ok to hold Coumadin x5 days prior to cath. Coumadin is managed by Dr. Claris Gower at [facility], informed him that patient will be holding for 5 days once cath is scheduled.

## 2015-09-08 ENCOUNTER — Telehealth: Payer: Self-pay | Admitting: Cardiology

## 2015-09-08 NOTE — Telephone Encounter (Signed)
New Message  Pt has questions about upcoming cath procedure. Please call back and discuss.

## 2015-09-08 NOTE — Telephone Encounter (Signed)
Patient wanted to verify when he could eat before his catheterization so they could make reservations for his birthday. Informed the patient he may eat until midnight the night before his procedure. Reminded patient to pick up instruction letter when he has his lab work drawn 12/12 and to call back if he has any further questions after reviewing. Patient grateful for call.

## 2015-09-14 ENCOUNTER — Other Ambulatory Visit (INDEPENDENT_AMBULATORY_CARE_PROVIDER_SITE_OTHER): Payer: Medicare Other | Admitting: *Deleted

## 2015-09-14 DIAGNOSIS — R9439 Abnormal result of other cardiovascular function study: Secondary | ICD-10-CM

## 2015-09-14 DIAGNOSIS — R931 Abnormal findings on diagnostic imaging of heart and coronary circulation: Secondary | ICD-10-CM

## 2015-09-14 DIAGNOSIS — Z01812 Encounter for preprocedural laboratory examination: Secondary | ICD-10-CM

## 2015-09-14 DIAGNOSIS — I480 Paroxysmal atrial fibrillation: Secondary | ICD-10-CM

## 2015-09-14 LAB — PROTIME-INR
INR: 1.17 (ref <1.50)
PROTHROMBIN TIME: 15.1 s (ref 11.6–15.2)

## 2015-09-14 LAB — BASIC METABOLIC PANEL
BUN: 11 mg/dL (ref 7–25)
CALCIUM: 9.5 mg/dL (ref 8.6–10.3)
CO2: 25 mmol/L (ref 20–31)
CREATININE: 0.89 mg/dL (ref 0.70–1.18)
Chloride: 100 mmol/L (ref 98–110)
GLUCOSE: 272 mg/dL — AB (ref 65–99)
Potassium: 4.6 mmol/L (ref 3.5–5.3)
SODIUM: 136 mmol/L (ref 135–146)

## 2015-09-14 LAB — CBC WITH DIFFERENTIAL/PLATELET
BASOS PCT: 0 % (ref 0–1)
Basophils Absolute: 0 10*3/uL (ref 0.0–0.1)
EOS ABS: 0.1 10*3/uL (ref 0.0–0.7)
Eosinophils Relative: 1 % (ref 0–5)
HCT: 43.4 % (ref 39.0–52.0)
Hemoglobin: 14.8 g/dL (ref 13.0–17.0)
Lymphocytes Relative: 23 % (ref 12–46)
Lymphs Abs: 1.3 10*3/uL (ref 0.7–4.0)
MCH: 30 pg (ref 26.0–34.0)
MCHC: 34.1 g/dL (ref 30.0–36.0)
MCV: 88 fL (ref 78.0–100.0)
MONO ABS: 0.4 10*3/uL (ref 0.1–1.0)
MONOS PCT: 7 % (ref 3–12)
MPV: 10.1 fL (ref 8.6–12.4)
NEUTROS ABS: 3.8 10*3/uL (ref 1.7–7.7)
NEUTROS PCT: 69 % (ref 43–77)
PLATELETS: 169 10*3/uL (ref 150–400)
RBC: 4.93 MIL/uL (ref 4.22–5.81)
RDW: 14 % (ref 11.5–15.5)
WBC: 5.5 10*3/uL (ref 4.0–10.5)

## 2015-09-14 LAB — APTT: aPTT: 32 seconds (ref 24–37)

## 2015-09-16 ENCOUNTER — Encounter (HOSPITAL_COMMUNITY)
Admission: RE | Disposition: A | Discharge status: Home / Self Care | Payer: Self-pay | Source: Ambulatory Visit | Attending: Cardiovascular Disease

## 2015-09-16 ENCOUNTER — Ambulatory Visit (HOSPITAL_COMMUNITY)
Admission: RE | Admit: 2015-09-16 | Discharge: 2015-09-16 | Disposition: A | Discharge status: Home / Self Care | Payer: Medicare Other | Source: Ambulatory Visit | Attending: Cardiovascular Disease | Admitting: Cardiovascular Disease

## 2015-09-16 ENCOUNTER — Encounter (HOSPITAL_COMMUNITY): Payer: Self-pay | Admitting: Cardiovascular Disease

## 2015-09-16 DIAGNOSIS — I25119 Atherosclerotic heart disease of native coronary artery with unspecified angina pectoris: Secondary | ICD-10-CM | POA: Diagnosis not present

## 2015-09-16 DIAGNOSIS — Z7901 Long term (current) use of anticoagulants: Secondary | ICD-10-CM | POA: Diagnosis not present

## 2015-09-16 DIAGNOSIS — I358 Other nonrheumatic aortic valve disorders: Secondary | ICD-10-CM | POA: Diagnosis not present

## 2015-09-16 DIAGNOSIS — M1990 Unspecified osteoarthritis, unspecified site: Secondary | ICD-10-CM | POA: Insufficient documentation

## 2015-09-16 DIAGNOSIS — T82855A Stenosis of coronary artery stent, initial encounter: Secondary | ICD-10-CM | POA: Insufficient documentation

## 2015-09-16 DIAGNOSIS — E7800 Pure hypercholesterolemia, unspecified: Secondary | ICD-10-CM | POA: Diagnosis not present

## 2015-09-16 DIAGNOSIS — F329 Major depressive disorder, single episode, unspecified: Secondary | ICD-10-CM | POA: Insufficient documentation

## 2015-09-16 DIAGNOSIS — Z794 Long term (current) use of insulin: Secondary | ICD-10-CM | POA: Diagnosis not present

## 2015-09-16 DIAGNOSIS — E039 Hypothyroidism, unspecified: Secondary | ICD-10-CM | POA: Diagnosis not present

## 2015-09-16 DIAGNOSIS — I25110 Atherosclerotic heart disease of native coronary artery with unstable angina pectoris: Secondary | ICD-10-CM | POA: Diagnosis not present

## 2015-09-16 DIAGNOSIS — I480 Paroxysmal atrial fibrillation: Secondary | ICD-10-CM | POA: Diagnosis not present

## 2015-09-16 DIAGNOSIS — Z87891 Personal history of nicotine dependence: Secondary | ICD-10-CM | POA: Diagnosis not present

## 2015-09-16 DIAGNOSIS — Y712 Prosthetic and other implants, materials and accessory cardiovascular devices associated with adverse incidents: Secondary | ICD-10-CM | POA: Insufficient documentation

## 2015-09-16 DIAGNOSIS — K219 Gastro-esophageal reflux disease without esophagitis: Secondary | ICD-10-CM | POA: Insufficient documentation

## 2015-09-16 DIAGNOSIS — I10 Essential (primary) hypertension: Secondary | ICD-10-CM | POA: Insufficient documentation

## 2015-09-16 DIAGNOSIS — Z7984 Long term (current) use of oral hypoglycemic drugs: Secondary | ICD-10-CM | POA: Insufficient documentation

## 2015-09-16 DIAGNOSIS — R6884 Jaw pain: Secondary | ICD-10-CM | POA: Diagnosis not present

## 2015-09-16 DIAGNOSIS — E1151 Type 2 diabetes mellitus with diabetic peripheral angiopathy without gangrene: Secondary | ICD-10-CM | POA: Diagnosis not present

## 2015-09-16 DIAGNOSIS — I252 Old myocardial infarction: Secondary | ICD-10-CM | POA: Diagnosis not present

## 2015-09-16 HISTORY — PX: CARDIAC CATHETERIZATION: SHX172

## 2015-09-16 LAB — GLUCOSE, CAPILLARY
GLUCOSE-CAPILLARY: 300 mg/dL — AB (ref 65–99)
Glucose-Capillary: 338 mg/dL — ABNORMAL HIGH (ref 65–99)
Glucose-Capillary: 213 mg/dL — ABNORMAL HIGH (ref 65–99)

## 2015-09-16 SURGERY — LEFT HEART CATH AND CORONARY ANGIOGRAPHY
Anesthesia: LOCAL

## 2015-09-16 MED ORDER — IOHEXOL 350 MG/ML SOLN
INTRAVENOUS | Status: DC | PRN
  Administered 2015-09-16: 80 mL via INTRA_ARTERIAL

## 2015-09-16 MED ORDER — SODIUM CHLORIDE 0.9 % IV SOLN: 250.0000 mL | INTRAVENOUS | Status: DC | PRN

## 2015-09-16 MED ORDER — VERAPAMIL HCL 2.5 MG/ML IV SOLN
INTRAVENOUS | Status: DC | PRN
  Administered 2015-09-16: 7 mL via INTRA_ARTERIAL

## 2015-09-16 MED ORDER — SODIUM CHLORIDE 0.9 % IJ SOLN: 3.0000 mL | INTRAMUSCULAR | Status: DC | PRN

## 2015-09-16 MED ORDER — FENTANYL CITRATE (PF) 100 MCG/2ML IJ SOLN
INTRAMUSCULAR | Status: DC | PRN
  Administered 2015-09-16 (×2): 25 ug via INTRAVENOUS

## 2015-09-16 MED ORDER — SODIUM CHLORIDE 0.9 % IJ SOLN: 3.0000 mL | Freq: Two times a day (BID) | INTRAMUSCULAR | Status: DC

## 2015-09-16 MED ORDER — MIDAZOLAM HCL 2 MG/2ML IJ SOLN
INTRAMUSCULAR | Status: DC | PRN
  Administered 2015-09-16 (×2): 1 mg via INTRAVENOUS

## 2015-09-16 MED ORDER — SODIUM CHLORIDE 0.9 % WEIGHT BASED INFUSION: 1.0000 mL/kg/h | INTRAVENOUS | Status: DC

## 2015-09-16 MED ORDER — ASPIRIN 81 MG PO CHEW
CHEWABLE_TABLET | ORAL | Status: AC
  Administered 2015-09-16: 81 mg via ORAL
  Filled 2015-09-16: qty 1

## 2015-09-16 MED ORDER — MIDAZOLAM HCL 2 MG/2ML IJ SOLN
INTRAMUSCULAR | Status: AC
  Filled 2015-09-16: qty 2

## 2015-09-16 MED ORDER — ASPIRIN 81 MG PO CHEW
81.0000 mg | CHEWABLE_TABLET | ORAL | Status: AC
  Administered 2015-09-16: 81 mg via ORAL

## 2015-09-16 MED ORDER — HEPARIN (PORCINE) IN NACL 2-0.9 UNIT/ML-% IJ SOLN
INTRAMUSCULAR | Status: AC
  Filled 2015-09-16: qty 1500

## 2015-09-16 MED ORDER — VERAPAMIL HCL 2.5 MG/ML IV SOLN
INTRAVENOUS | Status: AC
  Filled 2015-09-16: qty 2

## 2015-09-16 MED ORDER — FENTANYL CITRATE (PF) 100 MCG/2ML IJ SOLN
INTRAMUSCULAR | Status: AC
  Filled 2015-09-16: qty 2

## 2015-09-16 MED ORDER — LIDOCAINE HCL (PF) 1 % IJ SOLN
INTRAMUSCULAR | Status: DC | PRN
  Administered 2015-09-16: 5 mL

## 2015-09-16 MED ORDER — SODIUM CHLORIDE 0.9 % WEIGHT BASED INFUSION
3.0000 mL/kg/h | INTRAVENOUS | Status: DC
  Administered 2015-09-16: 3 mL/kg/h via INTRAVENOUS

## 2015-09-16 MED ORDER — LIDOCAINE HCL (PF) 1 % IJ SOLN
INTRAMUSCULAR | Status: AC
  Filled 2015-09-16: qty 30

## 2015-09-16 MED ORDER — HEPARIN SODIUM (PORCINE) 1000 UNIT/ML IJ SOLN
INTRAMUSCULAR | Status: AC
  Filled 2015-09-16: qty 1

## 2015-09-16 MED ORDER — INSULIN ASPART 100 UNIT/ML ~~LOC~~ SOLN
0.0000 [IU] | Freq: Three times a day (TID) | SUBCUTANEOUS | Status: DC
  Administered 2015-09-16: 11 [IU] via SUBCUTANEOUS

## 2015-09-16 MED ORDER — HEPARIN SODIUM (PORCINE) 1000 UNIT/ML IJ SOLN
INTRAMUSCULAR | Status: DC | PRN
  Administered 2015-09-16: 4500 [IU] via INTRAVENOUS

## 2015-09-16 MED ORDER — INSULIN ASPART 100 UNIT/ML ~~LOC~~ SOLN
SUBCUTANEOUS | Status: AC
  Administered 2015-09-16: 11 [IU] via SUBCUTANEOUS
  Filled 2015-09-16: qty 1

## 2015-09-16 MED ORDER — SODIUM CHLORIDE 0.9 % IV SOLN: INTRAVENOUS | Status: AC

## 2015-09-16 MED ORDER — SODIUM CHLORIDE 0.9 % IV SOLN
250.0000 mL | INTRAVENOUS | Status: DC | PRN
Start: 2015-09-16 — End: 2015-09-16

## 2015-09-16 SURGICAL SUPPLY — 11 items

## 2015-09-16 NOTE — H&P (View-Only) (Signed)
Cardiology Office Note   Date:  08/17/2015   ID:  [name], DOB [birth year], MRN [id number]  PCP:  Leonard Downing, MD    Chief Complaint  Patient presents with  . Coronary Artery Disease  . Hypertension  . Hyperlipidemia      History of Present Illness: Riley Jordan is a 79 y.o. male with a history of ASCAD, HTN, dyslipidemia, PAF who presents today for followup. He is doing well. He denies any chest pain, SOB, DOE, LE edema, palpitation or syncope.   He has some dizziness occasionally when going from sitting to standing.  He walks for exercise and works in the yard.  Yesterday he had an episode of jaw pain when he was working outside which was identical to the discomfort he had when his CAD was first diagnosed.     Past Medical History  Diagnosis Date  . Wears glasses   . Hypothyroidism   . GERD (gastroesophageal reflux disease)   . Anginal pain ([place])   . Heart attack ([place]) Jan 16, 1999  . Type II diabetes mellitus ([place])   . History of blood transfusion 1990's    S/P back OR  . Arthritis     "back; hands" (2012/07/25)  . Depression     "wife died 16-Jan-2012" (07/25/12)  . Diabetic peripheral angiopathy ([place])   . CAD (coronary atherosclerotic disease)     s/ PCI left circ/OM 09/1999, cutting balloon PCI mid LAD 08/2001, , chronically untreated tortuous lateral marginal branch, DES to prox to mid LAD and DES to mid circ 10/13  . Hypertension   . PAF (paroxysmal atrial fibrillation) ([place])   . High cholesterol     intolerant to Lipitor/Crestor    Past Surgical History  Procedure Laterality Date  . Coronary angioplasty with stent placement  1999/01/16; 07/25/2012    "3 + 2; total of 5" (07-25-2012)  . Cholecystectomy  January 16, 2011  . Back surgery    . Back surgery    . Lumbar disc surgery  1997  . Posterior fusion lumbar spine  ~ 1998  . Percutaneous coronary stent intervention (pci-s) N/A 2012-07-25    Procedure: PERCUTANEOUS CORONARY STENT INTERVENTION  (PCI-S);  Surgeon: Jettie Booze, MD;  Location: [facility] CATH LAB;  Service: Cardiovascular;  Laterality: N/A;     Current Outpatient Prescriptions  Medication Sig Dispense Refill  . acetaminophen (TYLENOL) 500 MG tablet Take 500-1,000 mg by mouth 2 (two) times daily. Takes 1000mg  in the am and 500mg  in the pm    . amiodarone (PACERONE) 200 MG tablet Take 1 tablet (200 mg total) by mouth daily. 90 tablet 3  . amLODipine (NORVASC) 5 MG tablet Take 1 tablet (5 mg total) by mouth daily. 90 tablet 3  . ferrous fumarate (HEMOCYTE - 106 MG FE) 325 (106 FE) MG TABS tablet Take 1 tablet by mouth daily.    . hydrochlorothiazide (HYDRODIURIL) 50 MG tablet Take 1 tablet by mouth daily.  0  . insulin NPH-insulin regular (NOVOLIN 70/30) (70-30) 100 UNIT/ML injection Inject 35-50 Units into the skin 2 (two) times daily with a meal. 45 units qAM, 20 units at lunch and 18 units  qPM    . isosorbide mononitrate (IMDUR) 60 MG 24 hr tablet Take 1.5 tablets (90 mg total) by mouth daily. 135 tablet 2  . levothyroxine (SYNTHROID, LEVOTHROID) 175 MCG tablet Take 175 mcg by mouth daily.    Marland Kitchen  meclizine (ANTIVERT) 25 MG tablet Take 25 mg by mouth at bedtime.     . metFORMIN (GLUCOPHAGE) 1000 MG tablet Take 1,000 mg by mouth 2 (two) times daily with a meal.    . nitroGLYCERIN (NITROSTAT) 0.4 MG SL tablet Place 0.4 mg under the tongue every 5 (five) minutes as needed for chest pain.    . ONE TOUCH ULTRA TEST test strip   0  . pantoprazole (PROTONIX) 40 MG tablet Take 1 tablet (40 mg total) by mouth daily at 6 (six) AM. 30 tablet 11  . pravastatin (PRAVACHOL) 40 MG tablet Take 1 tablet (40 mg total) by mouth every evening. 90 tablet 2  . warfarin (COUMADIN) 5 MG tablet Take 2.5-5 mg by mouth daily. Alternates daily with 5mg  and 2.5mg      No current facility-administered medications for this visit.    Allergies:   Review of patient's allergies indicates no known allergies.    Social History:  The patient  reports  that he quit smoking about 25 years ago. His smoking use included Cigarettes. He has a 40 pack-year smoking history. He has never used smokeless tobacco. He reports that he drinks alcohol. He reports that he does not use illicit drugs.   Family History:  The patient's family history includes Cancer in his father and mother.    ROS:  Please see the history of present illness.   Otherwise, review of systems are positive for none.   All other systems are reviewed and negative.    PHYSICAL EXAM: VS:  BP 128/80 mmHg  Pulse 62  Ht 6\' 1"  (1.854 m)  Wt 195 lb 3.2 oz (88.542 kg)  BMI 25.76 kg/m2 , BMI Body mass index is 25.76 kg/(m^2). GEN: Well nourished, well developed, in no acute distress HEENT: normal Neck: no JVD, carotid bruits, or masses Cardiac: RRR; no murmurs, rubs, or gallops,no edema  Respiratory:  clear to auscultation bilaterally, normal work of breathing GI: soft, nontender, nondistended, + BS MS: no deformity or atrophy Skin: warm and dry, no rash Neuro:  Strength and sensation are intact Psych: euthymic mood, full affect   EKG:  EKG is ordered today. The ekg ordered today demonstrates NSR at 62bpm with nonspecific ST abnormality   Recent Labs: 08/18/2014: TSH 3.71 06/19/2015: ALT 15    Lipid Panel    Component Value Date/Time   CHOL 102 06/19/2015 0731   TRIG 113.0 06/19/2015 0731   HDL 30.90* 06/19/2015 0731   CHOLHDL 3 06/19/2015 0731   VLDL 22.6 06/19/2015 0731   LDLCALC 48 06/19/2015 0731      Wt Readings from Last 3 Encounters:  08/17/15 195 lb 3.2 oz (88.542 kg)  02/16/15 204 lb 12.8 oz (92.897 kg)  08/18/14 205 lb (92.987 kg)     ASSESSMENT AND PLAN:  1. ASCAD with exertional jaw pain that was his first symptom with his CAD in the past.   - continue Imdur/statin  - not on ASA due to warfarin - check a Stress myoview to rule out ischemia 2. HTN controlled - Continue amlodipine/HCTZ  3. Dyslipidemia - LDL at goal at 48 - continue  pravastatin 4. PAF maintaining NSR - continue amiodarone and warfarin  - check TSH/PFTs with DLCO for chronic monitoring for amio toxicity  5.  Heart murmur - with AV sclerosis on echo 02/2015   Current medicines are reviewed at length with the patient today.  The patient does not have concerns regarding medicines.  The following changes have been made:  no change  Labs/ tests ordered today: See above Assessment and Plan No orders of the defined types were placed in this encounter.     Disposition:   FU with me in 6 months  Signed, [name], MD  08/17/2015 8:56 AM    [facility] [place], [place], [place]  [address] Phone: [phone]; Fax: [phone]

## 2015-09-16 NOTE — Interval H&P Note (Signed)
History and Physical Interval Note:  09/16/2015 8:46 AM  [name]  has presented today for cardiac cath with the diagnosis of unstable angina, Abnormal Stress Test  The various methods of treatment have been discussed with the patient and family. After consideration of risks, benefits and other options for treatment, the patient has consented to  Procedure(s): Left Heart Cath and Coronary Angiography (N/A) as a surgical intervention .  The patient's history has been reviewed, patient examined, no change in status, stable for surgery.  I have reviewed the patient's chart and labs.  Questions were answered to the patient's satisfaction.    Cath Lab Visit (complete for each Cath Lab visit)  Clinical Evaluation Leading to the Procedure:   ACS: No.  Non-ACS:    Anginal Classification: CCS II  Anti-ischemic medical therapy: Minimal Therapy (1 class of medications)  Non-Invasive Test Results: Low-risk stress test findings: cardiac mortality <1%/year  Prior CABG: No previous CABG        Riley Jordan

## 2015-09-16 NOTE — Discharge Instructions (Signed)
Resume coumadin today.  Hold Metformin for 48 hours then resume. Radial Site Care Refer to this sheet in the next few weeks. These instructions provide you with information about caring for yourself after your procedure. Your health care provider may also give you more specific instructions. Your treatment has been planned according to current medical practices, but problems sometimes occur. Call your health care provider if you have any problems or questions after your procedure. WHAT TO EXPECT AFTER THE PROCEDURE After your procedure, it is typical to have the following:  Bruising at the radial site that usually fades within 1-2 weeks.  Blood collecting in the tissue (hematoma) that may be painful to the touch. It should usually decrease in size and tenderness within 1-2 weeks. HOME CARE INSTRUCTIONS  Take medicines only as directed by your health care provider.  You may shower 24-48 hours after the procedure or as directed by your health care provider. Remove the bandage (dressing) and gently wash the site with plain soap and water. Pat the area dry with a clean towel. Do not rub the site, because this may cause bleeding.  Do not take baths, swim, or use a hot tub until your health care provider approves.  Check your insertion site every day for redness, swelling, or drainage.  Do not apply powder or lotion to the site.  Do not flex or bend the affected arm for 24 hours or as directed by your health care provider.  Do not push or pull heavy objects with the affected arm for 24 hours or as directed by your health care provider.  Do not lift over 10 lb (4.5 kg) for 5 days after your procedure or as directed by your health care provider.  Ask your health care provider when it is okay to:  Return to work or school.  Resume usual physical activities or sports.  Resume sexual activity.  Do not drive home if you are discharged the same day as the procedure. Have someone else drive  you.  You may drive 24 hours after the procedure unless otherwise instructed by your health care provider.  Do not operate machinery or power tools for 24 hours after the procedure.  If your procedure was done as an outpatient procedure, which means that you went home the same day as your procedure, a responsible adult should be with you for the first 24 hours after you arrive home.  Keep all follow-up visits as directed by your health care provider. This is important. SEEK MEDICAL CARE IF:  You have a fever.  You have chills.  You have increased bleeding from the radial site. Hold pressure on the site. SEEK IMMEDIATE MEDICAL CARE IF:  You have unusual pain at the radial site.  You have redness, warmth, or swelling at the radial site.  You have drainage (other than a small amount of blood on the dressing) from the radial site.  The radial site is bleeding, and the bleeding does not stop after 30 minutes of holding steady pressure on the site.  Your arm or hand becomes pale, cool, tingly, or numb.   This information is not intended to replace advice given to you by your health care provider. Make sure you discuss any questions you have with your health care provider.   Document Released: 10/22/2010 Document Revised: 10/10/2014 Document Reviewed: 04/07/2014 Elsevier Interactive Patient Education [employer].

## 2015-09-18 NOTE — Telephone Encounter (Signed)
error 

## 2015-11-12 ENCOUNTER — Other Ambulatory Visit: Payer: Self-pay | Admitting: *Deleted

## 2015-11-12 ENCOUNTER — Telehealth: Payer: Self-pay | Admitting: Cardiology

## 2015-11-12 MED ORDER — PANTOPRAZOLE SODIUM 40 MG PO TBEC: 40.0000 mg | DELAYED_RELEASE_TABLET | Freq: Every day | ORAL | Status: DC

## 2015-11-12 NOTE — Telephone Encounter (Signed)
New messge       *STAT* If patient is at the pharmacy, call can be transferred to refill team.   1. Which medications need to be refilled? (please list name of each medication and dose if known) pravastatin 40mg  2. Which pharmacy/location (including street and city if local pharmacy) is medication to be sent to? [facility]----new pharmacy  3. Do they need a 30 day or 90 day supply? 90 day

## 2015-11-25 ENCOUNTER — Other Ambulatory Visit: Payer: Self-pay | Admitting: Family Medicine

## 2015-11-25 ENCOUNTER — Ambulatory Visit
Admission: RE | Admit: 2015-11-25 | Discharge: 2015-11-25 | Disposition: A | Discharge status: Home / Self Care | Payer: Medicare Other | Source: Ambulatory Visit | Attending: Family Medicine | Admitting: Family Medicine

## 2015-11-25 DIAGNOSIS — R634 Abnormal weight loss: Secondary | ICD-10-CM

## 2015-12-22 ENCOUNTER — Other Ambulatory Visit: Payer: Self-pay | Admitting: *Deleted

## 2015-12-22 ENCOUNTER — Telehealth: Payer: Self-pay | Admitting: Cardiology

## 2015-12-22 DIAGNOSIS — I480 Paroxysmal atrial fibrillation: Secondary | ICD-10-CM

## 2015-12-22 MED ORDER — AMIODARONE HCL 200 MG PO TABS: 200.0000 mg | ORAL_TABLET | Freq: Every day | ORAL | Status: DC

## 2015-12-22 NOTE — Telephone Encounter (Signed)
New Message   *STAT* If patient is at the pharmacy, call can be transferred to refill team.   1. Which medications need to be refilled? (please list name of each medication and dose if known) amiodarone (PACERONE) 200 MG tablet  2. Which pharmacy/location (including street and city if local pharmacy) is medication to be sent to? [facility] [place] Zip [phone] 3. Do they need a 30 day or 90 day supply? 90  Comments: will need a new script.

## 2016-01-08 ENCOUNTER — Telehealth: Payer: Self-pay | Admitting: Cardiology

## 2016-01-08 ENCOUNTER — Other Ambulatory Visit: Payer: Self-pay | Admitting: *Deleted

## 2016-01-08 MED ORDER — AMLODIPINE BESYLATE 5 MG PO TABS: 5.0000 mg | ORAL_TABLET | Freq: Every day | ORAL | Status: DC

## 2016-01-08 NOTE — Telephone Encounter (Signed)
New message       *STAT* If patient is at the pharmacy, call can be transferred to refill team.   1. Which medications need to be refilled? (please list name of each medication and dose if known)  Amlodipine 5mg  2. Which pharmacy/location (including street and city if local pharmacy) is medication to be sent to?[facility]---new pharmacy 3. Do they need a 30 day or 90 day supply? 90 day

## 2016-01-18 ENCOUNTER — Other Ambulatory Visit: Payer: Self-pay

## 2016-01-18 MED ORDER — ISOSORBIDE MONONITRATE ER 60 MG PO TB24: 90.0000 mg | ORAL_TABLET | Freq: Every day | ORAL | Status: DC

## 2016-01-20 ENCOUNTER — Other Ambulatory Visit: Payer: Self-pay

## 2016-01-20 MED ORDER — PANTOPRAZOLE SODIUM 40 MG PO TBEC: 40.0000 mg | DELAYED_RELEASE_TABLET | Freq: Every day | ORAL | Status: DC

## 2016-02-11 ENCOUNTER — Other Ambulatory Visit: Payer: Self-pay | Admitting: Cardiology

## 2016-02-16 ENCOUNTER — Encounter: Payer: Self-pay | Admitting: Cardiology

## 2016-02-16 ENCOUNTER — Ambulatory Visit (INDEPENDENT_AMBULATORY_CARE_PROVIDER_SITE_OTHER): Payer: Medicare Other | Admitting: Cardiology

## 2016-02-16 VITALS — BP 150/82 | HR 62 | Ht 73.0 in | Wt 189.4 lb

## 2016-02-16 DIAGNOSIS — I10 Essential (primary) hypertension: Secondary | ICD-10-CM

## 2016-02-16 DIAGNOSIS — I480 Paroxysmal atrial fibrillation: Secondary | ICD-10-CM

## 2016-02-16 DIAGNOSIS — I350 Nonrheumatic aortic (valve) stenosis: Secondary | ICD-10-CM

## 2016-02-16 DIAGNOSIS — I358 Other nonrheumatic aortic valve disorders: Secondary | ICD-10-CM

## 2016-02-16 DIAGNOSIS — E785 Hyperlipidemia, unspecified: Secondary | ICD-10-CM | POA: Diagnosis not present

## 2016-02-16 DIAGNOSIS — I2510 Atherosclerotic heart disease of native coronary artery without angina pectoris: Secondary | ICD-10-CM

## 2016-02-16 HISTORY — DX: Nonrheumatic aortic (valve) stenosis: I35.0

## 2016-02-16 NOTE — Progress Notes (Signed)
Cardiology Office Note    Date:  02/16/2016   ID:  OLAWALE STONEBURNER, DOB December 28, 1935, MRN [id number]  PCP:  Leonard Downing, MD  Cardiologist:  Fransico Him, MD   Chief Complaint  Patient presents with  . Coronary Artery Disease  . Hypertension  . Atrial Fibrillation    History of Present Illness:At   Riley Jordan is a 80 y.o. male with a history of ASCAD, HTN, dyslipidemia, PAF who presents today for followup. He is doing well. At last OV he was complaining of CP and underwent cath showing patent stents in the prox/mid LAD and mid LCx/OM1 with mild diffuse restenosis of the LAD/Diag. He had mild disease in the RCA with mild LV dysfunction with EF 45-50%.  .  He denies any chest pain, SOB, DOE,palpitation or syncope. He has some dizziness occasionally when going from sitting to standing. He rarely will have some mild LE edema that comes and goes.  He walks for exercise and works in the yard.      Past Medical History  Diagnosis Date  . Wears glasses   . Hypothyroidism   . GERD (gastroesophageal reflux disease)   . Anginal pain ([place])   . Heart attack ([place]) 1999-01-30  . Type II diabetes mellitus ([place])   . History of blood transfusion 1990's    S/P back OR  . Arthritis     "back; hands" (2012/08/09)  . Depression     "wife died 01-30-2012" (09-Aug-2012)  . Diabetic peripheral angiopathy ([place])   . CAD (coronary atherosclerotic disease)     s/ PCI left circ/OM 09/1999, cutting balloon PCI mid LAD 08/2001, , chronically untreated tortuous lateral marginal branch, DES to prox to mid LAD and DES to mid circ 10/13  . Hypertension   . PAF (paroxysmal atrial fibrillation) ([place])   . High cholesterol     intolerant to Lipitor/Crestor  . Aortic valve sclerosis 02/16/2016    Past Surgical History  Procedure Laterality Date  . Coronary angioplasty with stent placement  01/30/99; 08/09/2012    "3 + 2; total of 5" (08-09-12)  . Cholecystectomy  2011-01-30  . Back surgery    . Back surgery      . Lumbar disc surgery  1997  . Posterior fusion lumbar spine  ~ 1998  . Percutaneous coronary stent intervention (pci-s) N/A 08/09/2012    Procedure: PERCUTANEOUS CORONARY STENT INTERVENTION (PCI-S);  Surgeon: Jettie Booze, MD;  Location: [facility] CATH LAB;  Service: Cardiovascular;  Laterality: N/A;  . Cardiac catheterization N/A 09/16/2015    Procedure: Left Heart Cath and Coronary Angiography;  Surgeon: Burnell Blanks, MD;  Location: [facility] CV LAB;  Service: Cardiovascular;  Laterality: N/A;    Current Medications: Outpatient Prescriptions Prior to Visit  Medication Sig Dispense Refill  . acetaminophen (TYLENOL) 500 MG tablet Take 500-1,000 mg by mouth 2 (two) times daily. Take 1 tablet (500 mg) by mouth every morning and 2 tablets (1000 mg) at bedtime    . amiodarone (PACERONE) 200 MG tablet Take 1 tablet (200 mg total) by mouth daily. 90 tablet 2  . amLODipine (NORVASC) 5 MG tablet Take 1 tablet (5 mg total) by mouth daily. 90 tablet 1  . ferrous sulfate 325 (65 FE) MG tablet Take 325 mg by mouth at bedtime.    . hydrochlorothiazide (HYDRODIURIL) 50 MG tablet Take 50 mg by mouth daily.   0  . isosorbide mononitrate (IMDUR) 60 MG 24 hr tablet Take 1.5 tablets (90  mg total) by mouth at bedtime. 135 tablet 0  . meclizine (ANTIVERT) 25 MG tablet Take 25 mg by mouth 3 (three) times daily.     . nitroGLYCERIN (NITROSTAT) 0.4 MG SL tablet Place 0.4 mg under the tongue every 5 (five) minutes as needed for chest pain.    . ONE TOUCH ULTRA TEST test strip   0  . pantoprazole (PROTONIX) 40 MG tablet Take 1 tablet (40 mg total) by mouth daily at 6 (six) AM. 30 tablet 8  . pravastatin (PRAVACHOL) 40 MG tablet Take 1 tablet by mouth every evening. 90 tablet 1  . warfarin (COUMADIN) 5 MG tablet Take 2.5-5 mg by mouth daily with supper. Take 1 tablet (5 mg) by mouth 1st day, then take 1/2 tablet (2.5 mg) on 2nd and 3rd day, then repeat    . guaiFENesin 200 MG tablet Take 200 mg by mouth  daily.    . insulin NPH-insulin regular (NOVOLIN 70/30) (70-30) 100 UNIT/ML injection Inject 10-40 Units into the skin 3 (three) times daily with meals. Inject 40 units subcutaneously with breakfast, and 10 units at lunch and supper    . levothyroxine (SYNTHROID, LEVOTHROID) 175 MCG tablet Take 175 mcg by mouth daily.     Facility-Administered Medications Prior to Visit  Medication Dose Route Frequency Provider Last Rate Last Dose  . aminophylline injection 75 mg  75 mg Intravenous BID PRN Dorothy Spark, MD   75 mg at 08/31/15 1005     Allergies:   Review of patient's allergies indicates no known allergies.   Social History   Social History  . Marital Status: Widowed    Spouse Name: N/A  . Number of Children: N/A  . Years of Education: N/A   Social History Main Topics  . Smoking status: Former Smoker -- 1.00 packs/day for 40 years    Types: Cigarettes    Quit date: 09/26/1989  . Smokeless tobacco: Never Used  . Alcohol Use: Yes     Comment: 07/12/2012 "daquiri maybe 3X/yr"  . Drug Use: No  . Sexual Activity: No   Other Topics Concern  . None   Social History Narrative     Family History:  The patient's family history includes Cancer in his father and mother.   ROS:   Please see the history of present illness.    ROS All other systems reviewed and are negative.   PHYSICAL EXAM:   VS:  BP 150/82 mmHg  Pulse 62  Ht 6\' 1"  (1.854 m)  Wt 189 lb 6.4 oz (85.911 kg)  BMI 24.99 kg/m2   GEN: Well nourished, well developed, in no acute distress HEENT: normal Neck: no JVD, carotid bruits, or masses Cardiac: RRR; no rubs, or gallops,no edema.  Intact distal pulses bilaterally. 2/6 SM at RUSB to LLSB Respiratory:  clear to auscultation bilaterally, normal work of breathing GI: soft, nontender, nondistended, + BS MS: no deformity or atrophy Skin: warm and dry, no rash Neuro:  Alert and Oriented x 3, Strength and sensation are intact Psych: euthymic mood, full affect  Wt  Readings from Last 3 Encounters:  02/16/16 189 lb 6.4 oz (85.911 kg)  09/16/15 195 lb (88.451 kg)  08/31/15 195 lb (88.451 kg)      Studies/Labs Reviewed:   EKG:  EKG is not ordered today.    Recent Labs: 06/19/2015: ALT 15 08/17/2015: TSH 0.721 09/14/2015: BUN 11; Creat 0.89; Hemoglobin 14.8; Platelets 169; Potassium 4.6; Sodium 136   Lipid Panel  Component Value Date/Time   CHOL 102 06/19/2015 0731   TRIG 113.0 06/19/2015 0731   HDL 30.90* 06/19/2015 0731   CHOLHDL 3 06/19/2015 0731   VLDL 22.6 06/19/2015 0731   LDLCALC 48 06/19/2015 0731    Additional studies/ records that were reviewed today include:  none    ASSESSMENT:    1. Atherosclerosis of native coronary artery of native heart without angina pectoris   2. Essential hypertension   3. PAF (paroxysmal atrial fibrillation) ([place])   4. Dyslipidemia   5. Aortic valve sclerosis      PLAN:  In order of problems listed above:  1. ASCAD with recent cath showing stable stents in the LAD/Diag and LCx/OM.  He has not had any further angina and is on medical management. Continue Imdur/statin. Not on ASA due to warfarin. 2. HTN - BP borderline controlled on Amlodipine/diuretic.  I have asked him to check his BP daily for a week and call with the results.   3. PAF maintaining NSR on Amio.  Continue warfarin.  Check LFTs.  He is up to date on TSH and PFTs.  Counseled to get yearly eye exam. 4. Dyslipidemia - continue pravastatin. Check FLP and ALT 5. AVSC with no stenosis on echo 02/2015 with heart murmur    Medication Adjustments/Labs and Tests Ordered: Current medicines are reviewed at length with the patient today.  Concerns regarding medicines are outlined above.  Medication changes, Labs and Tests ordered today are listed in the Patient Instructions below.  There are no Patient Instructions on file for this visit.   Signed, Fransico Him, MD  02/16/2016 11:28 AM    [facility] [address], [place], [place]  [address] Phone: [phone]; Fax: [phone]

## 2016-02-16 NOTE — Patient Instructions (Signed)

## 2016-02-22 ENCOUNTER — Other Ambulatory Visit (INDEPENDENT_AMBULATORY_CARE_PROVIDER_SITE_OTHER): Payer: Medicare Other

## 2016-02-22 DIAGNOSIS — I2510 Atherosclerotic heart disease of native coronary artery without angina pectoris: Secondary | ICD-10-CM | POA: Diagnosis not present

## 2016-02-22 LAB — HEPATIC FUNCTION PANEL
ALK PHOS: 62 U/L (ref 40–115)
ALT: 17 U/L (ref 9–46)
AST: 19 U/L (ref 10–35)
Albumin: 4.5 g/dL (ref 3.6–5.1)
BILIRUBIN DIRECT: 0.2 mg/dL (ref <=0.2)
BILIRUBIN TOTAL: 0.9 mg/dL (ref 0.2–1.2)
Indirect Bilirubin: 0.7 mg/dL (ref 0.2–1.2)
Total Protein: 6.8 g/dL (ref 6.1–8.1)

## 2016-02-22 LAB — LIPID PANEL
CHOLESTEROL: 124 mg/dL — AB (ref 125–200)
HDL: 43 mg/dL (ref >=40)
LDL CALC: 68 mg/dL (ref <130)
TRIGLYCERIDES: 65 mg/dL (ref <150)
Total CHOL/HDL Ratio: 2.9 Ratio (ref <=5.0)
VLDL: 13 mg/dL (ref <30)

## 2016-02-22 LAB — BASIC METABOLIC PANEL
BUN: 13 mg/dL (ref 7–25)
CO2: 29 mmol/L (ref 20–31)
Calcium: 9.5 mg/dL (ref 8.6–10.3)
Chloride: 102 mmol/L (ref 98–110)
Creat: 0.98 mg/dL (ref 0.70–1.18)
Glucose, Bld: 60 mg/dL — ABNORMAL LOW (ref 65–99)
POTASSIUM: 3.6 mmol/L (ref 3.5–5.3)
SODIUM: 141 mmol/L (ref 135–146)

## 2016-02-23 ENCOUNTER — Telehealth: Payer: Self-pay

## 2016-02-23 NOTE — Telephone Encounter (Signed)
Received letter with blood pressure readings: 5/17: 134/75 5/18: 132/73 5/19: 113/69 5/20: 121/66 5/21: 128/67  To Dr. Radford Pax for review.

## 2016-02-24 NOTE — Telephone Encounter (Signed)
BP stable - no change in  meds 

## 2016-02-25 NOTE — Telephone Encounter (Signed)
Left message on VM that readings were received, reviewed and no changes are being made. Instructed patient to call if he has any questions.

## 2016-04-15 ENCOUNTER — Other Ambulatory Visit: Payer: Self-pay | Admitting: Cardiology

## 2016-06-12 ENCOUNTER — Ambulatory Visit (HOSPITAL_BASED_OUTPATIENT_CLINIC_OR_DEPARTMENT_OTHER): Payer: Medicare Other | Attending: Family Medicine | Admitting: Internal Medicine

## 2016-06-12 VITALS — Ht 72.0 in | Wt 191.0 lb

## 2016-06-12 DIAGNOSIS — Z7984 Long term (current) use of oral hypoglycemic drugs: Secondary | ICD-10-CM | POA: Diagnosis not present

## 2016-06-12 DIAGNOSIS — G4710 Hypersomnia, unspecified: Secondary | ICD-10-CM

## 2016-06-12 DIAGNOSIS — R0683 Snoring: Secondary | ICD-10-CM | POA: Diagnosis not present

## 2016-06-12 DIAGNOSIS — R5383 Other fatigue: Secondary | ICD-10-CM | POA: Diagnosis not present

## 2016-06-12 DIAGNOSIS — Z79899 Other long term (current) drug therapy: Secondary | ICD-10-CM | POA: Diagnosis not present

## 2016-06-12 DIAGNOSIS — I493 Ventricular premature depolarization: Secondary | ICD-10-CM | POA: Insufficient documentation

## 2016-06-12 DIAGNOSIS — G4761 Periodic limb movement disorder: Secondary | ICD-10-CM | POA: Diagnosis not present

## 2016-06-12 DIAGNOSIS — G4719 Other hypersomnia: Secondary | ICD-10-CM | POA: Insufficient documentation

## 2016-06-12 DIAGNOSIS — Z794 Long term (current) use of insulin: Secondary | ICD-10-CM | POA: Diagnosis not present

## 2016-06-24 DIAGNOSIS — G4710 Hypersomnia, unspecified: Secondary | ICD-10-CM | POA: Diagnosis not present

## 2016-06-24 NOTE — Procedures (Signed)
  Patient Name: Riley Jordan, Riley Jordan Date: 06/12/2016 Gender: Male D.O.B: Jun 29, 1936 Age (years): 79 Referring Provider: Claris Gower Height (inches): 72 Interpreting Physician: Baird Lyons MD, ABSM Weight (lbs): 191 RPSGT: Earney [place] BMI: 26 MRN: [id number] Neck Size: 15.00 CLINICAL INFORMATION Sleep Study Type: NPSG Indication for sleep study: Excessive Daytime Sleepiness, Fatigue Epworth Sleepiness Score: 12  SLEEP STUDY TECHNIQUE As per the AASM Manual for the Scoring of Sleep and Associated Events v2.3 (April 2016) with a hypopnea requiring 4% desaturations. The channels recorded and monitored were frontal, central and occipital EEG, electrooculogram (EOG), submentalis EMG (chin), nasal and oral airflow, thoracic and abdominal wall motion, anterior tibialis EMG, snore microphone, electrocardiogram, and pulse oximetry.  MEDICATIONS Patient's medications include: charted for review Medications self-administered by patient during sleep study : INSULIN, PRAVASTATIN, ISOSORBIDE MONONITRATE, METFORMIN, FERROUS SULFATE.  SLEEP ARCHITECTURE The study was initiated at 10:35:14 PM and ended at 4:56:07 AM. Sleep onset time was 25.8 minutes and the sleep efficiency was 77.7%. The total sleep time was 296.1 minutes. Stage REM latency was 168.0 minutes. The patient spent 4.73% of the night in stage N1 sleep, 84.13% in stage N2 sleep, 0.00% in stage N3 and 11.15% in REM. Alpha intrusion was absent. Supine sleep was 4.38%.  RESPIRATORY PARAMETERS The overall apnea/hypopnea index (AHI) was 0.4 per hour. There were 0 total apneas, including 0 obstructive, 0 central and 0 mixed apneas. There were 2 hypopneas and 6 RERAs. The AHI during Stage REM sleep was 0.0 per hour. AHI while supine was 4.6 per hour. The mean oxygen saturation was 93.50%. The minimum SpO2 during sleep was 90.00%. Soft snoring was noted during this study.  CARDIAC DATA The 2 lead EKG demonstrated sinus  rhythm. The mean heart rate was 94.23 beats per minute. Other EKG findings include: PVCs.  LEG MOVEMENT DATA The total PLMS were 849 with a resulting PLMS index of 172.05. Associated arousal with leg movement index was 7.9 .  IMPRESSIONS - No significant obstructive sleep apnea occurred during this study (AHI = 0.4/h). - No significant central sleep apnea occurred during this study (CAI = 0.0/h). - The patient had minimal or no oxygen desaturation during the study (Min O2 = 90.00%) - The patient snored with Soft snoring volume. - EKG findings include PVCs. - Severe periodic limb movements of sleep occurred during the study. Associated arousals were significant.  DIAGNOSIS - Periodic Limb Movement Syndrome (327.51 [G47.61 ICD-10])  RECOMMENDATIONS - Recommend treatment for limb movement sleep disorder- Periodic limb movement in sleep. - Avoid alcohol, sedatives and other CNS depressants that may worsen sleep apnea and disrupt normal sleep architecture. - Sleep hygiene should be reviewed to assess factors that may improve sleep quality. - Weight management and regular exercise should be initiated or continued if appropriate.  [Electronically signed] 06/24/2016 03:11 PM  Baird Lyons MD, [place], American Board of Sleep Medicine   NPI: [id number] [place], American Board of Sleep Medicine  ELECTRONICALLY SIGNED ON:  06/24/2016, 3:08 PM [place] PH: [phone]   FX: [phone] [place]

## 2016-07-04 ENCOUNTER — Other Ambulatory Visit: Payer: Self-pay | Admitting: Cardiology

## 2016-07-11 IMAGING — NM NM MYOCAR MULTI W/ SPECT
3 series · 18 of 18 positions shown · non-contrast
Comparison: none

[Series 1: stress_(id)_sa · 6.5mm · 6.51mm/px · 6 of 64 frames shown (1 of 2)]
[frame 6/64]
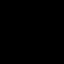
[frame 16/64]
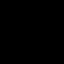
[frame 27/64]
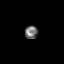
[frame 38/64]
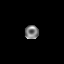
[frame 48/64]
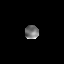
[frame 59/64]
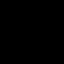

[Series 1: stress_(id)_sa · 6.5mm · 6.51mm/px · 6 of 512 frames shown (2 of 2)]
[frame 43/512]
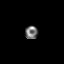
[frame 128/512]
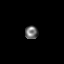
[frame 214/512]
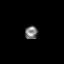
[frame 299/512]
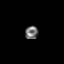
[frame 384/512]
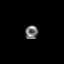
[frame 470/512]
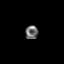

[Series 1: rest_(id)_sa · 6.5mm · 6.51mm/px · 6 of 64 frames shown]
[frame 6/64]
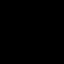
[frame 16/64]
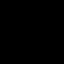
[frame 27/64]
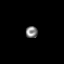
[frame 38/64]
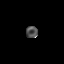
[frame 48/64]
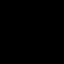
[frame 59/64]
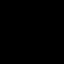

[18 of 18 positions shown; findings below may reference images not displayed]

Canned report from images found in remote index.

Refer to host system for actual result text.

## 2016-08-08 ENCOUNTER — Other Ambulatory Visit: Payer: Self-pay | Admitting: Cardiology

## 2016-08-09 ENCOUNTER — Ambulatory Visit (INDEPENDENT_AMBULATORY_CARE_PROVIDER_SITE_OTHER): Payer: Medicare Other | Admitting: Internal Medicine

## 2016-08-09 DIAGNOSIS — I480 Paroxysmal atrial fibrillation: Secondary | ICD-10-CM

## 2016-08-09 LAB — PULMONARY FUNCTION TEST
DL/VA % PRED: 110 %
DL/VA: 5.2 ml/min/mmHg/L
DLCO cor % pred: 86 %
DLCO cor: 30.33 ml/min/mmHg
DLCO unc % pred: 82 %
DLCO unc: 28.96 ml/min/mmHg
FEF 25-75 Post: 2.06 L/sec
FEF 25-75 Pre: 2.12 L/sec
FEF2575-%CHANGE-POST: -3 %
FEF2575-%PRED-POST: 90 %
FEF2575-%Pred-Pre: 93 %
FEV1-%CHANGE-POST: 0 %
FEV1-%Pred-Post: 86 %
FEV1-%Pred-Pre: 85 %
FEV1-PRE: 2.75 L
FEV1-Post: 2.77 L
FEV1FVC-%CHANGE-POST: 2 %
FEV1FVC-%Pred-Pre: 104 %
FEV6-%Change-Post: -1 %
FEV6-%PRED-POST: 85 %
FEV6-%Pred-Pre: 86 %
FEV6-PRE: 3.62 L
FEV6-Post: 3.58 L
FEV6FVC-%CHANGE-POST: 0 %
FEV6FVC-%PRED-PRE: 105 %
FEV6FVC-%Pred-Post: 106 %
FVC-%CHANGE-POST: -1 %
FVC-%Pred-Post: 80 %
FVC-%Pred-Pre: 81 %
FVC-POST: 3.59 L
FVC-Pre: 3.65 L
POST FEV1/FVC RATIO: 77 %
PRE FEV6/FVC RATIO: 99 %
Post FEV6/FVC ratio: 100 %
Pre FEV1/FVC ratio: 75 %
RV % PRED: 153 %
RV: 4.17 L
TLC % pred: 103 %
TLC: 7.69 L

## 2016-08-11 ENCOUNTER — Telehealth: Payer: Self-pay

## 2016-08-11 DIAGNOSIS — I480 Paroxysmal atrial fibrillation: Secondary | ICD-10-CM

## 2016-08-11 NOTE — Telephone Encounter (Signed)
Informed patient of results and verbal understanding expressed.  Repeat PFTs ordered to be scheduled in 1 year. Patient agrees with treatment plan. 

## 2016-08-11 NOTE — Telephone Encounter (Signed)
-----   Message from [name], MD sent at 08/11/2016  3:56 PM EST ----- Normal PFTs repeat in 1 year as he is on amio

## 2016-09-01 ENCOUNTER — Ambulatory Visit: Payer: Medicare Other | Admitting: Cardiology

## 2016-09-20 ENCOUNTER — Encounter: Payer: Self-pay | Admitting: Cardiology

## 2016-09-20 ENCOUNTER — Ambulatory Visit (INDEPENDENT_AMBULATORY_CARE_PROVIDER_SITE_OTHER): Payer: Medicare Other | Admitting: Cardiology

## 2016-09-20 VITALS — BP 134/66 | HR 60 | Ht 72.0 in | Wt 199.0 lb

## 2016-09-20 DIAGNOSIS — I2510 Atherosclerotic heart disease of native coronary artery without angina pectoris: Secondary | ICD-10-CM

## 2016-09-20 DIAGNOSIS — I481 Persistent atrial fibrillation: Secondary | ICD-10-CM

## 2016-09-20 DIAGNOSIS — E785 Hyperlipidemia, unspecified: Secondary | ICD-10-CM | POA: Diagnosis not present

## 2016-09-20 DIAGNOSIS — I10 Essential (primary) hypertension: Secondary | ICD-10-CM | POA: Diagnosis not present

## 2016-09-20 DIAGNOSIS — I4819 Other persistent atrial fibrillation: Secondary | ICD-10-CM

## 2016-09-20 LAB — TSH: TSH: 5.31 m[IU]/L — AB (ref 0.40–4.50)

## 2016-09-20 NOTE — Progress Notes (Signed)
Cardiology Office Note    Date:  09/20/2016   ID:  Riley Jordan, DOB 03-29-1936, MRN [id number]  PCP:  Leonard Downing, MD  Cardiologist:  Fransico Him, MD   Chief Complaint  Patient presents with  . Coronary Artery Disease  . Hypertension  . Atrial Fibrillation    History of Present Illness:  Riley Jordan is a 80 y.o. male with a history of ASCAD (cath a year ago for CP showed patent stents in the prox/mid LAD and mid LCx/OM1 with mild diffuse restenosis of the LAD/Diag, mild disease in the RCA with mild LV dysfunction with EF 45-50%) , HTN, dyslipidemia, PAF who presents today for followup. He is doing well.  He denies any chest pain, SOB, DOE,palpitation or syncope. He has some dizziness occasionally when going from sitting to standing. He rarely will have some mild LE edema that comes and goes.  He walks for exercise and works in the yard.     Past Medical History:  Diagnosis Date  . Aortic valve sclerosis 02/16/2016  . Arthritis    "back; hands" (02-Aug-2012)  . CAD (coronary atherosclerotic disease)    s/ PCI left circ/OM 09/1999, cutting balloon PCI mid LAD 08/2001, , chronically untreated tortuous lateral marginal branch, DES to prox to mid LAD and DES to mid circ 10/13,  patent stents in the prox/mid LAD and mid LCx/OM1 with mild diffuse restenosis of the LAD/Diag, mild disease in the RCA with mild LV dysfunction with EF 45-50%  . Depression    "wife died 2012/02/21" (Aug 02, 2012)  . Diabetic peripheral angiopathy ([place])   . GERD (gastroesophageal reflux disease)   . High cholesterol    intolerant to Lipitor/Crestor  . History of blood transfusion 1990's   S/P back OR  . Hypertension   . Hypothyroidism   . Persistent atrial fibrillation ([place])   . Type II diabetes mellitus ([place])   . Wears glasses     Past Surgical History:  Procedure Laterality Date  . BACK SURGERY    . BACK SURGERY    . CARDIAC CATHETERIZATION N/A 09/16/2015   Procedure: Left Heart  Cath and Coronary Angiography;  Surgeon: Burnell Blanks, MD;  Location: [facility];  Service: Cardiovascular;  Laterality: N/A;  . CHOLECYSTECTOMY  2012  . CORONARY ANGIOPLASTY WITH STENT PLACEMENT  2000; 2012/08/02   "3 + 2; total of 5" (2012/08/02)  . [place] SURGERY  1997  . PERCUTANEOUS CORONARY STENT INTERVENTION (PCI-S) N/A 02-Aug-2012   Procedure: PERCUTANEOUS CORONARY STENT INTERVENTION (PCI-S);  Surgeon: Jettie Booze, MD;  Location: [facility] CATH LAB;  Service: Cardiovascular;  Laterality: N/A;  . POSTERIOR FUSION LUMBAR SPINE  ~ 1998    Current Medications: Outpatient Medications Prior to Visit  Medication Sig Dispense Refill  . acetaminophen (TYLENOL) 500 MG tablet Take 500-1,000 mg by mouth 2 (two) times daily. Take 1 tablet (500 mg) by mouth every morning and 2 tablets (1000 mg) at bedtime    . amiodarone (PACERONE) 200 MG tablet Take 1 tablet (200 mg total) by mouth daily. 90 tablet 2  . amLODipine (NORVASC) 5 MG tablet TAKE 1 TABLET BY MOUTH DAILY 90 tablet 1  . ferrous sulfate 325 (65 FE) MG tablet Take 325 mg by mouth at bedtime.    . hydrochlorothiazide (HYDRODIURIL) 50 MG tablet Take 50 mg by mouth daily.   0  . insulin regular (NOVOLIN R,HUMULIN R) 100 units/mL injection Inject 3 to 8 units into the skin with meals  per sliding scale.    . isosorbide mononitrate (IMDUR) 60 MG 24 hr tablet TAKE 1 AND 1/2 TABLETS BY MOUTH EACH NIGHT AT BEDTIME 135 tablet 2  . LANTUS SOLOSTAR 100 UNIT/ML Solostar Pen Inject 42 Units into the skin at bedtime.    Marland Kitchen levothyroxine (SYNTHROID, LEVOTHROID) 150 MCG tablet Take 150 mcg by mouth daily before breakfast.    . meclizine (ANTIVERT) 25 MG tablet Take 25 mg by mouth 3 (three) times daily.     . metFORMIN (GLUCOPHAGE) 1000 MG tablet Take 1,000 mg by mouth 2 (two) times daily.    . nitroGLYCERIN (NITROSTAT) 0.4 MG SL tablet Place 0.4 mg under the tongue every 5 (five) minutes as needed for chest pain.    . ONE TOUCH ULTRA  TEST test strip   0  . pantoprazole (PROTONIX) 40 MG tablet Take 1 tablet (40 mg total) by mouth daily at 6 (six) AM. 30 tablet 8  . pravastatin (PRAVACHOL) 40 MG tablet TAKE 1 TABLET BY MOUTH EVERY EVENING 90 tablet 1  . terazosin (HYTRIN) 1 MG capsule Take 2 mg by mouth at bedtime.    Marland Kitchen warfarin (COUMADIN) 5 MG tablet Take 2.5-5 mg by mouth daily with supper. Take 1 tablet (5 mg) by mouth 1st day, then take 1/2 tablet (2.5 mg) on 2nd and 3rd day, then repeat     Facility-Administered Medications Prior to Visit  Medication Dose Route Frequency Provider Last Rate Last Dose  . aminophylline injection 75 mg  75 mg Intravenous BID PRN Dorothy Spark, MD   75 mg at 08/31/15 1005     Allergies:   Patient has no known allergies.   Social History   Social History  . Marital status: Widowed    Spouse name: N/A  . Number of children: N/A  . Years of education: N/A   Social History Main Topics  . Smoking status: Former Smoker    Packs/day: 1.00    Years: 40.00    Types: Cigarettes    Quit date: 09/26/1989  . Smokeless tobacco: Never Used  . Alcohol use Yes     Comment: 07/12/2012 "daquiri maybe 3X/yr"  . Drug use: No  . Sexual activity: No   Other Topics Concern  . None   Social History Narrative  . None     Family History:  The patient's family history includes Cancer in his father and mother.   ROS:   Please see the history of present illness.    ROS All other systems reviewed and are negative.  No flowsheet data found.     PHYSICAL EXAM:   VS:  BP 134/66   Pulse 60   Ht 6' (1.829 m)   Wt 199 lb (90.3 kg)   BMI 26.99 kg/m    GEN: Well nourished, well developed, in no acute distress  HEENT: normal  Neck: no JVD, carotid bruits, or masses Cardiac: RRR; no rubs, or gallops,no edema.  Intact distal pulses bilaterally. 1/6 SM at RUSB Respiratory:  clear to auscultation bilaterally, normal work of breathing GI: soft, nontender, nondistended, + BS MS: no deformity  or atrophy  Skin: warm and dry, no rash Neuro:  Alert and Oriented x 3, Strength and sensation are intact Psych: euthymic mood, full affect  Wt Readings from Last 3 Encounters:  09/20/16 199 lb (90.3 kg)  06/12/16 191 lb (86.6 kg)  02/16/16 189 lb 6.4 oz (85.9 kg)      Studies/Labs Reviewed:   EKG:  EKG  is  ordered today and showed NSR with nonspecific ST abnormality  Recent Labs: 02/22/2016: ALT 17; BUN 13; Creat 0.98; Potassium 3.6; Sodium 141   Lipid Panel    Component Value Date/Time   CHOL 124 (L) 02/22/2016 0825   TRIG 65 02/22/2016 0825   HDL 43 02/22/2016 0825   CHOLHDL 2.9 02/22/2016 0825   VLDL 13 02/22/2016 0825   LDLCALC 68 02/22/2016 0825    Additional studies/ records that were reviewed today include:  none    ASSESSMENT:    1. Atherosclerosis of native coronary artery of native heart without angina pectoris   2. Essential hypertension   3. Persistent atrial fibrillation ([place])   4. Dyslipidemia      PLAN:  In order of problems listed above:  1. ASCAD - cath a year ago showed patent stents in the prox/mid LAD and mid LCx/OM1 with mild diffuse restenosis of the LAD/Diag, mild disease in the RCA with mild LV dysfunction with EF 45-50%.  He has not had any further CP.  No BB due to borderline bradycardia.   Continue long acting nitrates and statin.  No ASA due to warfarin.   2. HTN - BP controlled on current meds.  Continue Hytrin, diuretic and amlodipine.  3. Persistent atrial fibrillation maintaining NSR on amio.  Continue warfarin.  I will check a TSH today.  His LFTs are up to date.  PFTs were done recently and were stable.  4. Dyslipidemia with LDL goal < 70.  Continue statin.  LDL at goal.     Medication Adjustments/Labs and Tests Ordered: Current medicines are reviewed at length with the patient today.  Concerns regarding medicines are outlined above.  Medication changes, Labs and Tests ordered today are listed in the Patient Instructions  below.  There are no Patient Instructions on file for this visit.   Signed, Fransico Him, MD  09/20/2016 9:58 AM    [address], [place], [place]  [address] Phone: [phone]; Fax: [phone]

## 2016-09-20 NOTE — Patient Instructions (Signed)
Medication Instructions:  Your physician recommends that you continue on your current medications as directed. Please refer to the Current Medication list given to you today.   Labwork: TODAY: TSH  Testing/Procedures: None  Follow-Up: Your physician wants you to follow-up in: 6 months with Dr. Radford Pax. You will receive a reminder letter in the mail two months in advance. If you don't receive a letter, please call our office to schedule the follow-up appointment.   Any Other Special Instructions Will Be Listed Below (If Applicable).     If you need a refill on your cardiac medications before your next appointment, please call your pharmacy.

## 2016-10-05 IMAGING — CR DG CHEST 2V
2 series · 2 of 2 positions shown · non-contrast
Comparison: 03/10/2011

CLINICAL DATA: Unexplained weight loss. Prior smoker. Coronary
artery disease.

EXAM:
CHEST  2 VIEW

[w chest pa]
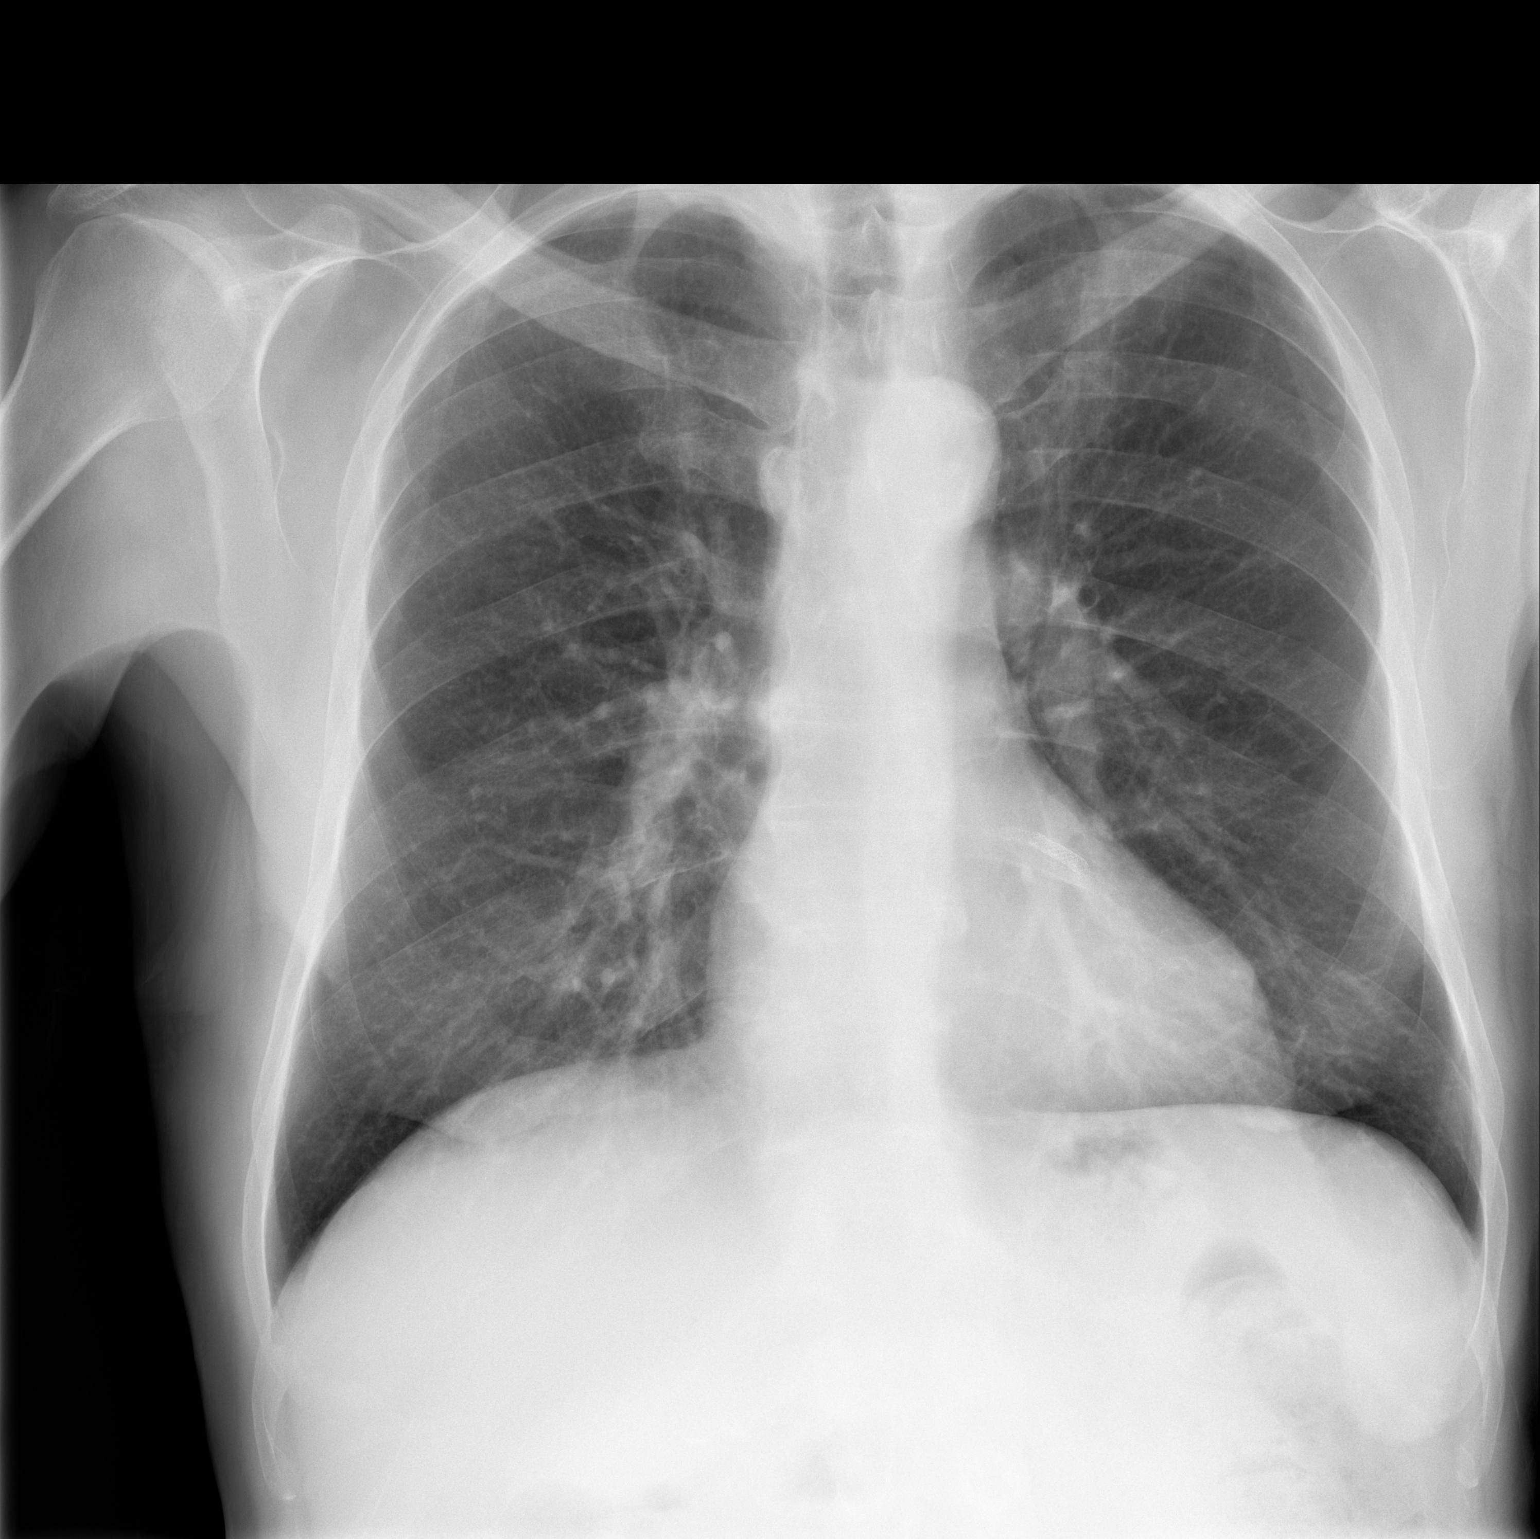

[w chest lat]
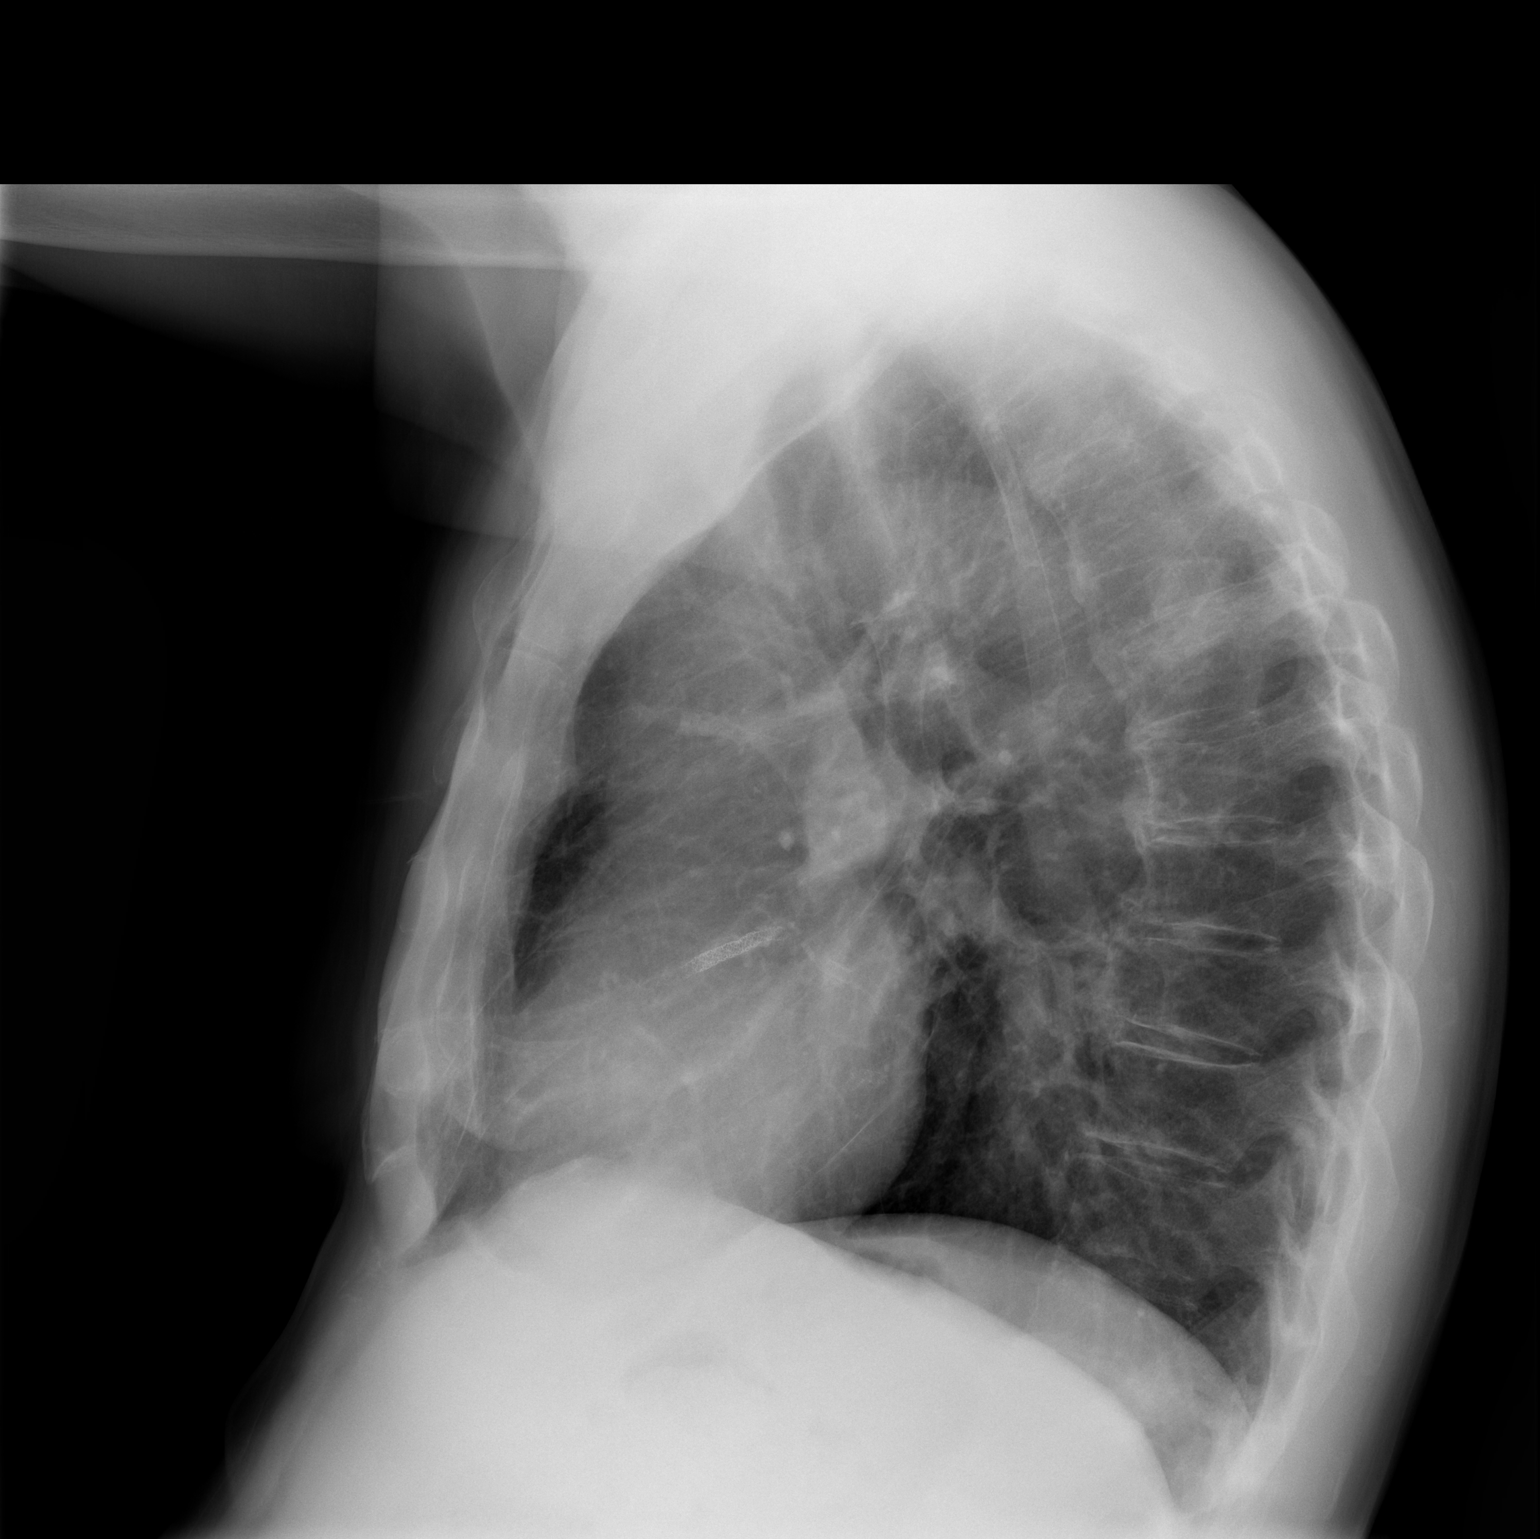

[2 of 2 positions shown; findings below may reference images not displayed]

FINDINGS: The heart size and mediastinal contours are within normal limits.
Coronary artery stent again noted. Both lungs are clear. Previously
seen pleural effusions have resolved since previous study. Thoracic
spine degenerative changes again noted.
IMPRESSION: No active cardiopulmonary disease.

## 2016-12-29 ENCOUNTER — Other Ambulatory Visit: Payer: Self-pay | Admitting: Cardiology

## 2017-01-09 ENCOUNTER — Other Ambulatory Visit: Payer: Self-pay | Admitting: Cardiology

## 2017-02-06 ENCOUNTER — Other Ambulatory Visit: Payer: Self-pay | Admitting: Cardiology

## 2017-02-16 ENCOUNTER — Other Ambulatory Visit: Payer: Self-pay | Admitting: Cardiology

## 2017-02-16 DIAGNOSIS — I480 Paroxysmal atrial fibrillation: Secondary | ICD-10-CM

## 2017-04-10 ENCOUNTER — Other Ambulatory Visit: Payer: Self-pay | Admitting: Cardiology

## 2017-04-20 ENCOUNTER — Encounter: Payer: Self-pay | Admitting: Cardiology

## 2017-04-20 ENCOUNTER — Ambulatory Visit (INDEPENDENT_AMBULATORY_CARE_PROVIDER_SITE_OTHER): Payer: Medicare Other | Admitting: Cardiology

## 2017-04-20 VITALS — BP 110/60 | HR 68 | Ht 72.0 in | Wt 194.8 lb

## 2017-04-20 DIAGNOSIS — I2510 Atherosclerotic heart disease of native coronary artery without angina pectoris: Secondary | ICD-10-CM | POA: Diagnosis not present

## 2017-04-20 DIAGNOSIS — I481 Persistent atrial fibrillation: Secondary | ICD-10-CM

## 2017-04-20 DIAGNOSIS — R011 Cardiac murmur, unspecified: Secondary | ICD-10-CM

## 2017-04-20 DIAGNOSIS — E785 Hyperlipidemia, unspecified: Secondary | ICD-10-CM | POA: Diagnosis not present

## 2017-04-20 DIAGNOSIS — I4819 Other persistent atrial fibrillation: Secondary | ICD-10-CM

## 2017-04-20 DIAGNOSIS — I10 Essential (primary) hypertension: Secondary | ICD-10-CM

## 2017-04-20 NOTE — Progress Notes (Signed)
Cardiology Office Note:    Date:  04/20/2017   ID:  Riley Jordan, DOB [birth year], MRN [id number]  PCP:  Leonard Downing, MD  Cardiologist:  Fransico Him, MD    Referring MD: Leonard Downing, *   Chief Complaint  Patient presents with  . Coronary Artery Disease  . Hypertension  . Atrial Fibrillation  . Hyperlipidemia    History of Present Illness:    Riley Jordan is a 81 y.o. male with a hx of with a history of ASCAD (cath 09/2015 for CP showed patent stents in the prox/mid LAD and mid LCx/OM1 with mild diffuse restenosis of the LAD/Diag, mild disease in the RCA with mild LV dysfunction with EF 45-50%) , HTN, dyslipidemia and persistent AF.  He is here today for followup and is doing well.  He has had a few episodes of jaw pain when pushing the wheelbarrow or holding something heavy in his hand but not when walking and working in the yard. He denies any chest pain or pressure, SOB, DOE,PND, orthopnea, palpitation, dizziness, LE edema or syncope.He continues to walk for exercise and works in the yard.    Past Medical History:  Diagnosis Date  . Aortic valve sclerosis 02/16/2016  . Arthritis    "back; hands" (07-27-12)  . CAD (coronary atherosclerotic disease)    s/ PCI left circ/OM 09/1999, cutting balloon PCI mid LAD 08/2001, , chronically untreated tortuous lateral marginal branch, DES to prox to mid LAD and DES to mid circ 10/13,  patent stents in the prox/mid LAD and mid LCx/OM1 with mild diffuse restenosis of the LAD/Diag, mild disease in the RCA with mild LV dysfunction with EF 45-50%  . Depression    "wife died 02/15/2012" (2012/07/27)  . Diabetic peripheral angiopathy ([place])   . GERD (gastroesophageal reflux disease)   . High cholesterol    intolerant to Lipitor/Crestor  . History of blood transfusion 1990's   S/P back OR  . Hypertension   . Hypothyroidism   . Persistent atrial fibrillation ([place])   . Type II diabetes mellitus ([place])   . Wears glasses      Past Surgical History:  Procedure Laterality Date  . BACK SURGERY    . BACK SURGERY    . CARDIAC CATHETERIZATION N/A 09/16/2015   Procedure: Left Heart Cath and Coronary Angiography;  Surgeon: Burnell Blanks, MD;  Location: [facility];  Service: Cardiovascular;  Laterality: N/A;  . CHOLECYSTECTOMY  2012  . CORONARY ANGIOPLASTY WITH STENT PLACEMENT  2000; 2012-07-27   "3 + 2; total of 5" (27-Jul-2012)  . [place] SURGERY  1997  . PERCUTANEOUS CORONARY STENT INTERVENTION (PCI-S) N/A 07/27/12   Procedure: PERCUTANEOUS CORONARY STENT INTERVENTION (PCI-S);  Surgeon: Jettie Booze, MD;  Location: [facility] CATH LAB;  Service: Cardiovascular;  Laterality: N/A;  . POSTERIOR FUSION LUMBAR SPINE  ~ 1998    Current Medications: Current Meds  Medication Sig  . acetaminophen (TYLENOL) 500 MG tablet Take 500-1,000 mg by mouth 2 (two) times daily. Take 1 tablet (500 mg) by mouth every morning and 2 tablets (1000 mg) at bedtime  . amiodarone (PACERONE) 200 MG tablet TAKE 1 TABLET BY MOUTH DAILY  . amLODipine (NORVASC) 5 MG tablet TAKE 1 TABLET BY MOUTH DAILY  . ferrous sulfate 325 (65 FE) MG tablet Take 325 mg by mouth at bedtime.  . hydrochlorothiazide (HYDRODIURIL) 50 MG tablet Take 50 mg by mouth daily.   . insulin regular (NOVOLIN R,HUMULIN R) 100  units/mL injection Inject 3 to 8 units into the skin with meals per sliding scale.  . isosorbide mononitrate (IMDUR) 60 MG 24 hr tablet Take 1.5 tablets (90 mg total) by mouth at bedtime. Please keep 04/20/17 appt for future refills  . LANTUS SOLOSTAR 100 UNIT/ML Solostar Pen Inject 42 Units into the skin at bedtime.  Marland Kitchen levothyroxine (SYNTHROID, LEVOTHROID) 150 MCG tablet Take 150 mcg by mouth daily before breakfast.  . meclizine (ANTIVERT) 25 MG tablet Take 25 mg by mouth 3 (three) times daily.   . metFORMIN (GLUCOPHAGE) 1000 MG tablet Take 1,000 mg by mouth 2 (two) times daily.  . nitroGLYCERIN (NITROSTAT) 0.4 MG SL tablet Place  0.4 mg under the tongue every 5 (five) minutes as needed for chest pain.  . ONE TOUCH ULTRA TEST test strip   . pravastatin (PRAVACHOL) 40 MG tablet TAKE 1 TABLET BY MOUTH EACH EVENING  . ranitidine (ZANTAC) 150 MG capsule Take 150 mg by mouth 2 (two) times daily.  Marland Kitchen terazosin (HYTRIN) 1 MG capsule Take 2 mg by mouth at bedtime.  Marland Kitchen warfarin (COUMADIN) 5 MG tablet Take 2.5-5 mg by mouth daily with supper. Take 1 tablet (5 mg) by mouth 1st day, then take 1/2 tablet (2.5 mg) on 2nd and 3rd day, then repeat     Allergies:   Patient has no known allergies.   Social History   Social History  . Marital status: Widowed    Spouse name: N/A  . Number of children: N/A  . Years of education: N/A   Social History Main Topics  . Smoking status: Former Smoker    Packs/day: 1.00    Years: 40.00    Types: Cigarettes    Quit date: 09/26/1989  . Smokeless tobacco: Never Used  . Alcohol use Yes     Comment: 07/12/2012 "daquiri maybe 3X/yr"  . Drug use: No  . Sexual activity: No   Other Topics Concern  . None   Social History Narrative  . None     Family History: The patient's family history includes Cancer in his father and mother. ROS:   Please see the history of present illness.   He has some constipation and balance problems.   All other systems reviewed and are negative.  EKGs/Labs/Other Studies Reviewed:    The following studies were reviewed today: none  EKG:  EKG is not ordered today  Recent Labs: 09/20/2016: TSH 5.31  Recent Lipid Panel    Component Value Date/Time   CHOL 124 (L) 02/22/2016 0825   TRIG 65 02/22/2016 0825   HDL 43 02/22/2016 0825   CHOLHDL 2.9 02/22/2016 0825   VLDL 13 02/22/2016 0825   LDLCALC 68 02/22/2016 0825    Physical Exam:    VS:  BP 110/60   Pulse 68   Ht 6' (1.829 m)   Wt 194 lb 12.8 oz (88.4 kg)   SpO2 97%   BMI 26.42 kg/m     Wt Readings from Last 3 Encounters:  04/20/17 194 lb 12.8 oz (88.4 kg)  09/20/16 199 lb (90.3 kg)   06/12/16 191 lb (86.6 kg)     GEN:  Well nourished, well developed in no acute distress HEENT: Normal NECK: No JVD; No carotid bruits LYMPHATICS: No lymphadenopathy CARDIAC: RRR, no rubs, gallops.  2/6 SM at RUSB to LLSB RESPIRATORY:  Clear to auscultation without rales, wheezing or rhonchi  ABDOMEN: Soft, non-tender, non-distended MUSCULOSKELETAL:  No edema; No deformity  SKIN: Warm and dry NEUROLOGIC:  Alert  and oriented x 3 PSYCHIATRIC:  Normal affect   ASSESSMENT:    1. Atherosclerosis of native coronary artery of native heart without angina pectoris   2. Essential hypertension   3. Persistent atrial fibrillation ([place])   4. Dyslipidemia   5. Heart murmur    PLAN:    In order of problems listed above:  1. ASCAD - cath 09/2015 for CP showed patent stents in the prox/mid LAD and mid LCx/OM1 with mild diffuse restenosis of the LAD/Diag, mild disease in the RCA - he has not had any chest pain.  He had a few episodes of jaw pain but only when lifting heavy objects and was not associated with CP.  O/W he has not had any angina. He will continue on statin and long acting nitrate.  He is not on ASA due to warfarin. I informed him that he needs to let me know if he develops any CP.  2. HTN - his BP is well controlled on exam today.  He will continue on amlodipine 5mg  daily, HCTZ 50mg  daily and Hytrin 2mg  daily.  I will check a BMET.   3. Persistent atrial fibrillation - he is maintaining NSR.  He will continue on amiodarone 200mg  daily and warfarin.  His INR is followed in our coumadin clinic. I will check a TSH, LFTs,  He is up to date on PFTs.   4. Dyslipidemia - his LDL goal is < 70.  He will continue on pravastatin 40mg  daily.  I will check an FLP and ALT.    5.   Heart murmur with history of thickened and calcified AV leaflets with severe restriction of the AV.  I will repeat 2D echo to assess for AS.    Medication Adjustments/Labs and Tests Ordered: Current medicines are  reviewed at length with the patient today.  Concerns regarding medicines are outlined above.  No orders of the defined types were placed in this encounter.  No orders of the defined types were placed in this encounter.   Signed, Fransico Him, MD  04/20/2017 11:20 AM    [place]

## 2017-04-20 NOTE — Patient Instructions (Signed)
Medication Instructions:  Your physician recommends that you continue on your current medications as directed. Please refer to the Current Medication list given to you today.   Labwork: Your physician recommends that you return for FASTING lab work the same day as your ECHO.  Testing/Procedures: Your physician has requested that you have an echocardiogram. Echocardiography is a painless test that uses sound waves to create images of your heart. It provides your doctor with information about the size and shape of your heart and how well your heart's chambers and valves are working. This procedure takes approximately one hour. There are no restrictions for this procedure.  Follow-Up: Your physician wants you to follow-up in: 6 months with Dr. Radford Pax. You will receive a reminder letter in the mail two months in advance. If you don't receive a letter, please call our office to schedule the follow-up appointment.   Any Other Special Instructions Will Be Listed Below (If Applicable).     If you need a refill on your cardiac medications before your next appointment, please call your pharmacy.

## 2017-04-28 ENCOUNTER — Other Ambulatory Visit: Payer: Self-pay

## 2017-04-28 ENCOUNTER — Other Ambulatory Visit: Payer: Medicare Other | Admitting: *Deleted

## 2017-04-28 ENCOUNTER — Ambulatory Visit (HOSPITAL_COMMUNITY): Payer: Medicare Other | Attending: Cardiology

## 2017-04-28 DIAGNOSIS — R011 Cardiac murmur, unspecified: Secondary | ICD-10-CM | POA: Diagnosis present

## 2017-04-28 DIAGNOSIS — I10 Essential (primary) hypertension: Secondary | ICD-10-CM

## 2017-04-28 DIAGNOSIS — E785 Hyperlipidemia, unspecified: Secondary | ICD-10-CM

## 2017-04-28 DIAGNOSIS — E039 Hypothyroidism, unspecified: Secondary | ICD-10-CM | POA: Insufficient documentation

## 2017-04-28 DIAGNOSIS — E119 Type 2 diabetes mellitus without complications: Secondary | ICD-10-CM | POA: Diagnosis not present

## 2017-04-28 DIAGNOSIS — I2510 Atherosclerotic heart disease of native coronary artery without angina pectoris: Secondary | ICD-10-CM

## 2017-04-28 DIAGNOSIS — I4891 Unspecified atrial fibrillation: Secondary | ICD-10-CM | POA: Diagnosis not present

## 2017-04-28 LAB — HEPATIC FUNCTION PANEL
ALBUMIN: 4.2 g/dL (ref 3.5–4.8)
ALT: 20 IU/L (ref 0–44)
AST: 23 IU/L (ref 0–40)
Alkaline Phosphatase: 63 IU/L (ref 39–117)
BILIRUBIN TOTAL: 1.1 mg/dL (ref 0.0–1.2)
Bilirubin, Direct: 0.28 mg/dL (ref 0.00–0.40)
Total Protein: 6.6 g/dL (ref 6.0–8.5)

## 2017-04-28 LAB — LIPID PANEL
CHOL/HDL RATIO: 3.1 ratio (ref 0.0–5.0)
Cholesterol, Total: 116 mg/dL (ref 100–199)
HDL: 37 mg/dL — AB (ref >39)
LDL Calculated: 58 mg/dL (ref 0–99)
TRIGLYCERIDES: 103 mg/dL (ref 0–149)
VLDL CHOLESTEROL CAL: 21 mg/dL (ref 5–40)

## 2017-04-28 LAB — BASIC METABOLIC PANEL
BUN/Creatinine Ratio: 12 (ref 10–24)
BUN: 12 mg/dL (ref 8–27)
CALCIUM: 9.3 mg/dL (ref 8.6–10.2)
CHLORIDE: 94 mmol/L — AB (ref 96–106)
CO2: 24 mmol/L (ref 20–29)
Creatinine, Ser: 0.99 mg/dL (ref 0.76–1.27)
GFR calc Af Amer: 84 mL/min/{1.73_m2} (ref >59)
GFR calc non Af Amer: 73 mL/min/{1.73_m2} (ref >59)
GLUCOSE: 259 mg/dL — AB (ref 65–99)
POTASSIUM: 4.1 mmol/L (ref 3.5–5.2)
SODIUM: 134 mmol/L (ref 134–144)

## 2017-05-08 ENCOUNTER — Other Ambulatory Visit: Payer: Self-pay | Admitting: Cardiology

## 2017-08-07 ENCOUNTER — Other Ambulatory Visit: Payer: Self-pay | Admitting: Cardiology

## 2017-08-18 ENCOUNTER — Other Ambulatory Visit: Payer: Self-pay | Admitting: Cardiology

## 2017-08-18 DIAGNOSIS — I480 Paroxysmal atrial fibrillation: Secondary | ICD-10-CM

## 2017-09-27 ENCOUNTER — Other Ambulatory Visit: Payer: Self-pay | Admitting: Cardiology

## 2017-10-30 NOTE — Progress Notes (Signed)
Cardiology Office Note:    Date:  10/31/2017   ID:  Riley Jordan, DOB January 24, 1936, MRN [id number]  PCP:  Leonard Downing, MD  Cardiologist:  No primary care provider on file.    Referring MD: Leonard Downing, *   Chief Complaint  Patient presents with  . Coronary Artery Disease  . Hypertension  . Atrial Fibrillation  . Hyperlipidemia    History of Present Illness:    Riley Jordan is a 82 y.o. male with a hx of ASCAD (cath 09/2015 for CP showedpatent stents in the prox/mid LAD and mid LCx/OM1 with mild diffuse restenosis of the LAD/Diag,mild disease in the RCA with mild LV dysfunction with EF 45-50%), HTN, dyslipidemia and persistent AF. He is here today for followup and is doing well.  He denies any chest pain or pressure, SOB, DOE, PND, orthopnea, LE edema, palpitations or syncope. He still gets lightheaded occasionally.  He is compliant with his meds and is tolerating meds with no SE.    Past Medical History:  Diagnosis Date  . Aortic stenosis 02/16/2016   mild by echo 2018  . Arthritis    "back; hands" (2012-07-26)  . CAD (coronary atherosclerotic disease)    s/ PCI left circ/OM 09/1999, cutting balloon PCI mid LAD 08/2001, , chronically untreated tortuous lateral marginal branch, DES to prox to mid LAD and DES to mid circ 10/13,  patent stents in the prox/mid LAD and mid LCx/OM1 with mild diffuse restenosis of the LAD/Diag, mild disease in the RCA with mild LV dysfunction with EF 45-50%  . Depression    "wife died Feb 13, 2012" (07/26/12)  . Diabetic peripheral angiopathy ([place])   . GERD (gastroesophageal reflux disease)   . High cholesterol    intolerant to Lipitor/Crestor  . History of blood transfusion 1990's   S/P back OR  . Hypertension   . Hypothyroidism   . Persistent atrial fibrillation ([place])   . Type II diabetes mellitus ([place])   . Wears glasses     Past Surgical History:  Procedure Laterality Date  . BACK SURGERY    . BACK SURGERY    . CARDIAC  CATHETERIZATION N/A 09/16/2015   Procedure: Left Heart Cath and Coronary Angiography;  Surgeon: Burnell Blanks, MD;  Location: [place] [facility];  Service: Cardiovascular;  Laterality: N/A;  . CHOLECYSTECTOMY  2012  . CORONARY ANGIOPLASTY WITH STENT PLACEMENT  2000; 07/26/2012   "3 + 2; total of 5" (Jul 26, 2012)  . [place] SURGERY  1997  . PERCUTANEOUS CORONARY STENT INTERVENTION (PCI-S) N/A 07/26/12   Procedure: PERCUTANEOUS CORONARY STENT INTERVENTION (PCI-S);  Surgeon: Jettie Booze, MD;  Location: [facility] C[facility];  Service: Cardiovascular;  Laterality: N/A;  . POSTERIOR FUSION LUMBAR SPINE  ~ 1998    Current Medications: Current Meds  Medication Sig  . acetaminophen (TYLENOL) 500 MG tablet Take 500-1,000 mg by mouth 2 (two) times daily. Take 1 tablet (500 mg) by mouth every morning and 2 tablets (1000 mg) at bedtime  . amiodarone (PACERONE) 200 MG tablet TAKE 1 TABLET BY MOUTH DAILY  . amLODipine (NORVASC) 5 MG tablet TAKE 1 TABLET BY MOUTH DAILY  . hydrochlorothiazide (HYDRODIURIL) 50 MG tablet Take 50 mg by mouth daily.   . insulin regular (NOVOLIN R,HUMULIN R) 100 units/mL injection Inject 3 to 8 units into the skin with meals per sliding scale.  . isosorbide mononitrate (IMDUR) 60 MG 24 hr tablet TAKE 1 AND 1/2 TABLET BY MOUTH AT BEDTIME  .  LANTUS SOLOSTAR 100 UNIT/ML Solostar Pen Inject 42 Units into the skin at bedtime.  Marland Kitchen levothyroxine (SYNTHROID, LEVOTHROID) 150 MCG tablet Take 150 mcg by mouth daily before breakfast.  . meclizine (ANTIVERT) 25 MG tablet Take 25 mg by mouth 3 (three) times daily.   . metFORMIN (GLUCOPHAGE) 1000 MG tablet Take 1,000 mg by mouth 2 (two) times daily.  . nitroGLYCERIN (NITROSTAT) 0.4 MG SL tablet Place 0.4 mg under the tongue every 5 (five) minutes as needed for chest pain.  . ONE TOUCH ULTRA TEST test strip   . pravastatin (PRAVACHOL) 40 MG tablet TAKE 1 TABLET BY MOUTH DAILY IN THE EVENING  . ranitidine (ZANTAC) 150 MG capsule  Take 150 mg by mouth 2 (two) times daily.  Marland Kitchen terazosin (HYTRIN) 1 MG capsule Take 2 mg by mouth at bedtime.  Marland Kitchen warfarin (COUMADIN) 5 MG tablet Take 2.5-5 mg by mouth daily with supper. Take 1 tablet (5 mg) by mouth 1st day, then take 1/2 tablet (2.5 mg) on 2nd and 3rd day, then repeat     Allergies:   Patient has no known allergies.   Social History   Socioeconomic History  . Marital status: Widowed    Spouse name: None  . Number of children: None  . Years of education: None  . Highest education level: None  Social Needs  . Financial resource strain: None  . Food insecurity - worry: None  . Food insecurity - inability: None  . Transportation needs - medical: None  . Transportation needs - non-medical: None  Occupational History  . None  Tobacco Use  . Smoking status: Former Smoker    Packs/day: 1.00    Years: 40.00    Pack years: 40.00    Types: Cigarettes    Last attempt to quit: 09/26/1989    Years since quitting: 28.1  . Smokeless tobacco: Never Used  Substance and Sexual Activity  . Alcohol use: Yes    Comment: 07/12/2012 "daquiri maybe 3X/yr"  . Drug use: No  . Sexual activity: No  Other Topics Concern  . None  Social History Narrative  . None     Family History: The patient's family history includes Cancer in his father and mother.  ROS:   Please see the history of present illness.    ROS  All other systems reviewed and negative.   EKGs/Labs/Other Studies Reviewed:    The following studies were reviewed today: none  EKG:  EKG is  ordered today.  The ekg ordered today demonstrates NSR at 70bpm with no ST changes.  QTC prolonged at 467msec.  Recent Labs: 04/28/2017: ALT 20; BUN 12; Creatinine, Ser 0.99; Potassium 4.1; Sodium 134   Recent Lipid Panel    Component Value Date/Time   CHOL 116 04/28/2017 0749   TRIG 103 04/28/2017 0749   HDL 37 (L) 04/28/2017 0749   CHOLHDL 3.1 04/28/2017 0749   CHOLHDL 2.9 02/22/2016 0825   VLDL 13 02/22/2016 0825     LDLCALC 58 04/28/2017 0749    Physical Exam:    VS:  BP 123/72   Pulse 70   Ht 6' (1.829 m)   Wt 201 lb 9.6 oz (91.4 kg)   BMI 27.34 kg/m     Wt Readings from Last 3 Encounters:  10/31/17 201 lb 9.6 oz (91.4 kg)  04/20/17 194 lb 12.8 oz (88.4 kg)  09/20/16 199 lb (90.3 kg)     GEN:  Well nourished, well developed in no acute distress HEENT: Normal NECK:  No JVD; No carotid bruits LYMPHATICS: No lymphadenopathy CARDIAC: RRR, no rubs, gallops.  1/6 SM at RUSB RESPIRATORY:  Clear to auscultation without rales, wheezing or rhonchi  ABDOMEN: Soft, non-tender, non-distended MUSCULOSKELETAL:  No edema; No deformity  SKIN: Warm and dry NEUROLOGIC:  Alert and oriented x 3 PSYCHIATRIC:  Normal affect   ASSESSMENT:    1. Atherosclerosis of native coronary artery of native heart without angina pectoris   2. Essential hypertension   3. Persistent atrial fibrillation ([place])   4. Dyslipidemia   5. Nonrheumatic aortic valve stenosis    PLAN:    In order of problems listed above:  1.  ASCAD - cath 09/2015 for CP showedpatent stents in the prox/mid LAD and mid LCx/OM1 with mild diffuse restenosis of the LAD/Diag,mild disease in the RCA with mild LV dysfunction with EF 45-50%.  He denies any anginal symptoms.  He will continue on long acting nitrates and statin.  He is not on ASA due to warfarin.   2.  HTN - BP is well controlled on exam today.  He will continue on Hytrin 2mg  qhs, Amlodipine 5mg  daily and HCTZ 50mg  daily.    3.  Persistent atrial fibrillation - he is maintaining NSR on exam today.  He will continue on amiodarone 200mg  daily and warfarin.  His INR is followed by his PCP. I will get  LFTs since he is on Amio.  His TSH was normal in November 2018.He is also due for PFTs with DLCO.  4.  Dyslipidemia with LDL goal < 70.  He will continue on Pravastatin 40mg  daily.  I will repeat an FLP and ALT.  His last LDL was 58 this past summer.   5.  Mild AS by echo 2018 - he is  asymptoamtic   Medication Adjustments/Labs and Tests Ordered: Current medicines are reviewed at length with the patient today.  Concerns regarding medicines are outlined above.  No orders of the defined types were placed in this encounter.  No orders of the defined types were placed in this encounter.   Signed, Fransico Him, MD  10/31/2017 10:36 AM    [place]

## 2017-10-31 ENCOUNTER — Encounter (INDEPENDENT_AMBULATORY_CARE_PROVIDER_SITE_OTHER): Payer: Self-pay

## 2017-10-31 ENCOUNTER — Encounter: Payer: Self-pay | Admitting: Cardiology

## 2017-10-31 ENCOUNTER — Ambulatory Visit (INDEPENDENT_AMBULATORY_CARE_PROVIDER_SITE_OTHER): Payer: Medicare Other | Admitting: Cardiology

## 2017-10-31 VITALS — BP 123/72 | HR 70 | Ht 72.0 in | Wt 201.6 lb

## 2017-10-31 DIAGNOSIS — E785 Hyperlipidemia, unspecified: Secondary | ICD-10-CM

## 2017-10-31 DIAGNOSIS — I350 Nonrheumatic aortic (valve) stenosis: Secondary | ICD-10-CM

## 2017-10-31 DIAGNOSIS — I10 Essential (primary) hypertension: Secondary | ICD-10-CM

## 2017-10-31 DIAGNOSIS — I4819 Other persistent atrial fibrillation: Secondary | ICD-10-CM

## 2017-10-31 DIAGNOSIS — I2510 Atherosclerotic heart disease of native coronary artery without angina pectoris: Secondary | ICD-10-CM | POA: Diagnosis not present

## 2017-10-31 DIAGNOSIS — I481 Persistent atrial fibrillation: Secondary | ICD-10-CM | POA: Diagnosis not present

## 2017-10-31 NOTE — Patient Instructions (Signed)
Medication Instructions:  Your physician recommends that you continue on your current medications as directed. Please refer to the Current Medication list given to you today.  If you need a refill on your cardiac medications, please contact your pharmacy first.  Labwork: Prescription given for BMET, liver function and fasting lipids.   Testing/Procedures: Your physician has recommended that you have a pulmonary function test. Pulmonary Function Tests are a group of tests that measure how well air moves in and out of your lungs.  Follow-Up: Your physician wants you to follow-up in: 6 months with Dr. Radford Pax. You will receive a reminder letter in the mail two months in advance. If you don't receive a letter, please call our office to schedule the follow-up appointment.  Any Other Special Instructions Will Be Listed Below (If Applicable).     If you need a refill on your cardiac medications before your next appointment, please call your pharmacy.

## 2017-11-06 ENCOUNTER — Ambulatory Visit (INDEPENDENT_AMBULATORY_CARE_PROVIDER_SITE_OTHER): Payer: Medicare Other | Admitting: Internal Medicine

## 2017-11-06 DIAGNOSIS — I481 Persistent atrial fibrillation: Secondary | ICD-10-CM

## 2017-11-06 DIAGNOSIS — I4819 Other persistent atrial fibrillation: Secondary | ICD-10-CM

## 2017-11-06 LAB — PULMONARY FUNCTION TEST
DL/VA % pred: 92 %
DL/VA: 4.34 ml/min/mmHg/L
DLCO UNC % PRED: 75 %
DLCO unc: 26.42 ml/min/mmHg
FEF 25-75 PRE: 2.06 L/s
FEF 25-75 Post: 2.23 L/sec
FEF2575-%Change-Post: 7 %
FEF2575-%Pred-Post: 100 %
FEF2575-%Pred-Pre: 93 %
FEV1-%Change-Post: 1 %
FEV1-%PRED-POST: 86 %
FEV1-%PRED-PRE: 85 %
FEV1-POST: 2.73 L
FEV1-Pre: 2.7 L
FEV1FVC-%CHANGE-POST: 1 %
FEV1FVC-%Pred-Pre: 104 %
FEV6-%CHANGE-POST: 0 %
FEV6-%PRED-POST: 86 %
FEV6-%PRED-PRE: 86 %
FEV6-PRE: 3.57 L
FEV6-Post: 3.57 L
FEV6FVC-%CHANGE-POST: 0 %
FEV6FVC-%PRED-PRE: 105 %
FEV6FVC-%Pred-Post: 105 %
FVC-%CHANGE-POST: 0 %
FVC-%Pred-Post: 81 %
FVC-%Pred-Pre: 81 %
FVC-Post: 3.59 L
FVC-Pre: 3.59 L
POST FEV6/FVC RATIO: 99 %
Post FEV1/FVC ratio: 76 %
Pre FEV1/FVC ratio: 75 %
Pre FEV6/FVC Ratio: 99 %
RV % PRED: 140 %
RV: 3.86 L
TLC % pred: 98 %
TLC: 7.37 L

## 2017-11-06 NOTE — Progress Notes (Signed)
PFT done today. 

## 2017-11-08 ENCOUNTER — Telehealth: Payer: Self-pay

## 2017-11-08 DIAGNOSIS — Z79899 Other long term (current) drug therapy: Secondary | ICD-10-CM

## 2017-11-08 NOTE — Telephone Encounter (Signed)
Notes recorded by Teressa Senter, RN on 11/08/2017 at 2:21 PM EST Patient made aware of results. Patient verbalizes understanding and thankful for the call. PFT ordered to be scheduled next year.    Notes recorded by [name], MD on 11/07/2017 at 4:30 PM EST Minimal diffusion abnormality - continue Amio and repeat PFTs with DLCO in 1 year

## 2018-05-01 ENCOUNTER — Other Ambulatory Visit: Payer: Self-pay | Admitting: Cardiology

## 2018-05-02 NOTE — Progress Notes (Signed)
Cardiology Office Note:    Date:  05/03/2018   ID:  Riley Jordan, DOB 1936/08/21, MRN [id number]  PCP:  Leonard Downing, MD  Cardiologist:  No primary care provider on file.    Referring MD: Leonard Downing, *   Chief Complaint  Patient presents with  . Coronary Artery Disease  . Hypertension  . Atrial Fibrillation  . Hyperlipidemia    History of Present Illness:    Riley Jordan is a 82 y.o. Riley with a hx of ASCAD (cath12/2016for CP showedpatent stents in the prox/mid LAD and mid LCx/OM1 with mild diffuse restenosis of the LAD/Diag,mild disease in the RCA with mild LV dysfunction with EF 45-50%), HTN, dyslipidemiaand persistentAF.  He is here today for followup and is doing well.  He denies any  SOB, DOE, PND, orthopnea,  dizziness, palpitations or syncope.  His chronic stable angina that only occurs with extreme exertion and resolves with rest.  He does not have any angina with his normal activities.  He occasionally has some lower extremity edema when he salts his tomatoes during the summer.  He is compliant with his meds and is tolerating meds with no SE.    Past Medical History:  Diagnosis Date  . Aortic stenosis 02/16/2016   mild by echo 2018  . Arthritis    "back; hands" (08/01/12)  . CAD (coronary atherosclerotic disease)    s/ PCI left circ/OM 09/1999, cutting balloon PCI mid LAD 08/2001, , chronically untreated tortuous lateral marginal branch, DES to prox to mid LAD and DES to mid circ 10/13,  patent stents in the prox/mid LAD and mid LCx/OM1 with mild diffuse restenosis of the LAD/Diag, mild disease in the RCA with mild LV dysfunction with EF 45-50%  . Depression    "wife died 02-17-2012" (01-Aug-2012)  . Diabetic peripheral angiopathy ([place])   . GERD (gastroesophageal reflux disease)   . High cholesterol    intolerant to Lipitor/Crestor  . History of blood transfusion 1990's   S/P back OR  . Hypertension   . Hypothyroidism   . Persistent atrial  fibrillation ([place])   . Type II diabetes mellitus ([place])   . Wears glasses     Past Surgical History:  Procedure Laterality Date  . BACK SURGERY    . BACK SURGERY    . CARDIAC CATHETERIZATION N/A 09/16/2015   Procedure: Left Heart Cath and Coronary Angiography;  Surgeon: Burnell Blanks, MD;  Location: [place] [facility] LAB;  Service: Cardiovascular;  Laterality: N/A;  . CHOLECYSTECTOMY  2012  . CORONARY ANGIOPLASTY WITH STENT PLACEMENT  2000; Aug 01, 2012   "3 + 2; total of 5" (08-01-2012)  . [place] SURGERY  1997  . PERCUTANEOUS CORONARY STENT INTERVENTION (PCI-S) N/A 2012-08-01   Procedure: PERCUTANEOUS CORONARY STENT INTERVENTION (PCI-S);  Surgeon: Jettie Booze, MD;  Location: [facility] CATH LAB;  Service: Cardiovascular;  Laterality: N/A;  . POSTERIOR FUSION LUMBAR SPINE  ~ 1998    Current Medications: Current Meds  Medication Sig  . acetaminophen (TYLENOL) 500 MG tablet Take 500-1,000 mg by mouth 2 (two) times daily. Take 1 tablet (500 mg) by mouth every morning and 2 tablets (1000 mg) at bedtime  . amiodarone (PACERONE) 200 MG tablet TAKE 1 TABLET BY MOUTH DAILY  . amLODipine (NORVASC) 5 MG tablet TAKE 1 TABLET BY MOUTH DAILY  . hydrochlorothiazide (HYDRODIURIL) 50 MG tablet Take 50 mg by mouth daily.   . insulin regular (NOVOLIN R,HUMULIN R) 100 units/mL injection Inject 3 to  8 units into the skin with meals per sliding scale.  . isosorbide mononitrate (IMDUR) 60 MG 24 hr tablet TAKE 1 AND 1/2 TABLET BY MOUTH AT BEDTIME  . LANTUS SOLOSTAR 100 UNIT/ML Solostar Pen Inject 42 Units into the skin at bedtime.  Marland Kitchen levothyroxine (SYNTHROID, LEVOTHROID) 175 MCG tablet Take 175 mcg by mouth daily before breakfast.   . meclizine (ANTIVERT) 25 MG tablet Take 25 mg by mouth 3 (three) times daily.   . metFORMIN (GLUCOPHAGE) 1000 MG tablet Take 1,000 mg by mouth 2 (two) times daily.  . nitroGLYCERIN (NITROSTAT) 0.4 MG SL tablet Place 0.4 mg under the tongue every 5 (five) minutes as  needed for chest pain.  . ONE TOUCH ULTRA TEST test strip   . pravastatin (PRAVACHOL) 40 MG tablet TAKE 1 TABLET BY MOUTH DAILY IN THE EVENING  . ranitidine (ZANTAC) 150 MG capsule Take 150 mg by mouth 2 (two) times daily.  Marland Kitchen terazosin (HYTRIN) 1 MG capsule Take 2 mg by mouth at bedtime.  Marland Kitchen warfarin (COUMADIN) 5 MG tablet Take 2.5-5 mg by mouth daily with supper. Take 1 tablet (5 mg) by mouth 1st day, then take 1/2 tablet (2.5 mg) on 2nd and 3rd day, then repeat  . [DISCONTINUED] levothyroxine (SYNTHROID, LEVOTHROID) 150 MCG tablet Take 150 mcg by mouth daily before breakfast.     Allergies:   Patient has no known allergies.   Social History   Socioeconomic History  . Marital status: Widowed    Spouse name: Not on file  . Number of children: Not on file  . Years of education: Not on file  . Highest education level: Not on file  Occupational History  . Not on file  Social Needs  . Financial resource strain: Not on file  . Food insecurity:    Worry: Not on file    Inability: Not on file  . Transportation needs:    Medical: Not on file    Non-medical: Not on file  Tobacco Use  . Smoking status: Former Smoker    Packs/day: 1.00    Years: 40.00    Pack years: 40.00    Types: Cigarettes    Last attempt to quit: 09/26/1989    Years since quitting: 28.6  . Smokeless tobacco: Never Used  Substance and Sexual Activity  . Alcohol use: Yes    Comment: 07/12/2012 "daquiri maybe 3X/yr"  . Drug use: No  . Sexual activity: Never  Lifestyle  . Physical activity:    Days per week: Not on file    Minutes per session: Not on file  . Stress: Not on file  Relationships  . Social connections:    Talks on phone: Not on file    Gets together: Not on file    Attends religious service: Not on file    Active member of club or organization: Not on file    Attends meetings of clubs or organizations: Not on file    Relationship status: Not on file  Other Topics Concern  . Not on file    Social History Narrative  . Not on file     Family History: The patient's family history includes Cancer in his father and mother.  ROS:   Please see the history of present illness.    ROS  All other systems reviewed and negative.   EKGs/Labs/Other Studies Reviewed:    The following studies were reviewed today:none  EKG:  EKG is not ordered today.    Recent Labs: No  results found for requested labs within last 8760 hours.   Recent Lipid Panel    Component Value Date/Time   CHOL 116 04/28/2017 0749   TRIG 103 04/28/2017 0749   HDL 37 (L) 04/28/2017 0749   CHOLHDL 3.1 04/28/2017 0749   CHOLHDL 2.9 02/22/2016 0825   VLDL 13 02/22/2016 0825   LDLCALC 58 04/28/2017 0749    Physical Exam:    VS:  BP 112/62 (BP Location: Right Arm, Patient Position: Sitting, Cuff Size: Normal)   Pulse 72   Ht 6' (1.829 m)   Wt 200 lb 9.6 oz (91 kg)   SpO2 97%   BMI 27.21 kg/m     Wt Readings from Last 3 Encounters:  05/03/18 200 lb 9.6 oz (91 kg)  10/31/17 201 lb 9.6 oz (91.4 kg)  04/20/17 194 lb 12.8 oz (88.4 kg)     GEN:  Well nourished, well developed in no acute distress HEENT: Normal NECK: No JVD; No carotid bruits LYMPHATICS: No lymphadenopathy CARDIAC: RRR, no rubs, gallops.  2/6 systolic murmur the right upper sternal border rating to left lower sternal border RESPIRATORY:  Clear to auscultation without rales, wheezing or rhonchi  ABDOMEN: Soft, non-tender, non-distended MUSCULOSKELETAL: Trace lower extremity edema; No deformity  SKIN: Warm and dry NEUROLOGIC:  Alert and oriented x 3 PSYCHIATRIC:  Normal affect   ASSESSMENT:    1. Atherosclerosis of native coronary artery of native heart without angina pectoris   2. Essential hypertension   3. Persistent atrial fibrillation ([place])   4. Nonrheumatic aortic valve stenosis   5. Dyslipidemia    PLAN:    In order of problems listed above:  1.  ASCAD - s/p PCI left circ/OM 09/1999, cutting balloon PCI mid LAD  08/2001,  chronically untreated tortuous lateral marginal branch, DES to prox to mid LAD and DES to mid circ 10/13,  patent stents in the prox/mid LAD and mid LCx/OM1 with mild diffuse restenosis of the LAD/Diag, mild disease in the RCA with mild LV dysfunction with EF 45-50% 2016.  He has chronic stable angina which is not increased in frequency, severity or duration.  This only occurs with extreme exertion and not with any of his daily activities.  He will continue on Imdur 90 mg daily and statin.  He does not take aspirin due to being on warfarin.  2.  HTN  -BP is well controlled on exam today.  He will continue on amlodipine 5 mg daily, HCTZ 50 mg daily, Imdur 90 mg daily and terazosin 2mg  at bedtime.  Creatinine was stable at 1.1 and potassium 4 on 02/05/2018.  3.  Persistent atrial fibrillation -he is maintaining normal sinus rhythm on exam.  He will continue warfarin for anticoagulation.  We will check a hemoglobin today since he is on anticoagulation.  He is up-to-date on TSH, ALT and PFTs with DLCO.  4.  Mild aortic stenosis -2D echocardiogram a year ago showed mild aortic stenosis with a mean aortic valve gradient of 13 mmHg.  5.  Hyperlipidemia -LDL goal is less than 70.  He will continue on pravastatin 40 mg daily.  LDL was at goal at 56 on 02/15/2018.  ALT was normal at that time.    Medication Adjustments/Labs and Tests Ordered: Current medicines are reviewed at length with the patient today.  Concerns regarding medicines are outlined above.  No orders of the defined types were placed in this encounter.  No orders of the defined types were placed in this encounter.  Signed, Fransico Him, MD  05/03/2018 10:26 AM    [place]

## 2018-05-03 ENCOUNTER — Encounter: Payer: Self-pay | Admitting: Cardiology

## 2018-05-03 ENCOUNTER — Ambulatory Visit (INDEPENDENT_AMBULATORY_CARE_PROVIDER_SITE_OTHER): Payer: Medicare Other | Admitting: Cardiology

## 2018-05-03 VITALS — BP 112/62 | HR 72 | Ht 72.0 in | Wt 200.6 lb

## 2018-05-03 DIAGNOSIS — I10 Essential (primary) hypertension: Secondary | ICD-10-CM

## 2018-05-03 DIAGNOSIS — I481 Persistent atrial fibrillation: Secondary | ICD-10-CM

## 2018-05-03 DIAGNOSIS — I2510 Atherosclerotic heart disease of native coronary artery without angina pectoris: Secondary | ICD-10-CM

## 2018-05-03 DIAGNOSIS — I4819 Other persistent atrial fibrillation: Secondary | ICD-10-CM

## 2018-05-03 DIAGNOSIS — I350 Nonrheumatic aortic (valve) stenosis: Secondary | ICD-10-CM | POA: Diagnosis not present

## 2018-05-03 DIAGNOSIS — E785 Hyperlipidemia, unspecified: Secondary | ICD-10-CM

## 2018-05-03 LAB — CBC
Hematocrit: 37.7 % (ref 37.5–51.0)
Hemoglobin: 13.1 g/dL (ref 13.0–17.7)
MCH: 29.8 pg (ref 26.6–33.0)
MCHC: 34.7 g/dL (ref 31.5–35.7)
MCV: 86 fL (ref 79–97)
Platelets: 135 10*3/uL — ABNORMAL LOW (ref 150–450)
RBC: 4.39 x10E6/uL (ref 4.14–5.80)
RDW: 14.4 % (ref 12.3–15.4)
WBC: 4.4 10*3/uL (ref 3.4–10.8)

## 2018-05-03 NOTE — Patient Instructions (Signed)
Your physician recommends that you continue on your current medications as directed. Please refer to the Current Medication list given to you today.  Your physician recommends that you return for lab work in today (CBC)  Your physician wants you to follow-up in: 6 months with Dr. Radford Pax.  You will receive a reminder letter in the mail two months in advance. If you don't receive a letter, please call our office to schedule the follow-up appointment.

## 2018-05-21 ENCOUNTER — Other Ambulatory Visit: Payer: Self-pay | Admitting: Cardiology

## 2018-05-21 DIAGNOSIS — I480 Paroxysmal atrial fibrillation: Secondary | ICD-10-CM

## 2018-06-25 ENCOUNTER — Other Ambulatory Visit: Payer: Self-pay | Admitting: Cardiology

## 2018-07-31 ENCOUNTER — Other Ambulatory Visit: Payer: Self-pay | Admitting: Cardiology

## 2018-11-05 ENCOUNTER — Other Ambulatory Visit: Payer: Self-pay | Admitting: Cardiology

## 2018-11-30 ENCOUNTER — Ambulatory Visit (INDEPENDENT_AMBULATORY_CARE_PROVIDER_SITE_OTHER): Payer: Medicare Other | Admitting: Cardiology

## 2018-11-30 VITALS — BP 130/66 | HR 57 | Ht 71.0 in | Wt 191.0 lb

## 2018-11-30 DIAGNOSIS — I10 Essential (primary) hypertension: Secondary | ICD-10-CM | POA: Diagnosis not present

## 2018-11-30 DIAGNOSIS — I2510 Atherosclerotic heart disease of native coronary artery without angina pectoris: Secondary | ICD-10-CM

## 2018-11-30 DIAGNOSIS — I4819 Other persistent atrial fibrillation: Secondary | ICD-10-CM

## 2018-11-30 DIAGNOSIS — E785 Hyperlipidemia, unspecified: Secondary | ICD-10-CM

## 2018-11-30 DIAGNOSIS — I350 Nonrheumatic aortic (valve) stenosis: Secondary | ICD-10-CM | POA: Diagnosis not present

## 2018-11-30 NOTE — Patient Instructions (Addendum)
Medication Instructions:  Your physician recommends that you continue on your current medications as directed. Please refer to the Current Medication list given to you today.  If you need a refill on your cardiac medications before your next appointment, please call your pharmacy.   Lab work: Future: 12/04/18 Fasting labs, BMET, CBC, LFT and Lipids  If you have labs (blood work) drawn today and your tests are completely normal, you will receive your results only by: Marland Kitchen MyChart Message (if you have MyChart) OR . A paper copy in the mail If you have any lab test that is abnormal or we need to change your treatment, we will call you to review the results.  Testing/Procedures: Your physician has requested that you have an echocardiogram. Echocardiography is a painless test that uses sound waves to create images of your heart. It provides your doctor with information about the size and shape of your heart and how well your heart's chambers and valves are working. This procedure takes approximately one hour. There are no restrictions for this procedure.  Your physician has recommended that you have a pulmonary function test. Pulmonary Function Tests are a group of tests that measure how well air moves in and out of your lungs.  Follow-Up: At [facility], you and your health needs are our priority.  As part of our continuing mission to provide you with exceptional heart care, we have created designated Provider Care Teams.  These Care Teams include your primary Cardiologist (physician) and Advanced Practice Providers (APPs -  Physician Assistants and Nurse Practitioners) who all work together to provide you with the care you need, when you need it.  Your physician wants you to follow-up in: 6 months with the PA. You will receive a reminder letter in the mail two months in advance. If you don't receive a letter, please call our office to schedule the follow-up appointment.  You will need a follow up  appointment in 1 years.  Please call our office 2 months in advance to schedule this appointment.  You may see Dr. Radford Pax or one of the following Advanced Practice Providers on your designated Care Team:   [place], PA-C Melina Copa, PA-C . Ermalinda Barrios, PA-C

## 2018-11-30 NOTE — Progress Notes (Signed)
Cardiology Office Note:    Date:  12/01/2018   ID:  Riley Jordan, DOB June 25, 1936, MRN [id number]  PCP:  Leonard Downing, MD  Cardiologist:  No primary care provider on file.    Referring MD: Leonard Downing, *   Chief Complaint  Patient presents with  . Coronary Artery Disease  . Hypertension  . Hyperlipidemia  . Atrial Fibrillation    History of Present Illness:    Riley Jordan is a 83 y.o. male with a hx of ASCAD (cath12/2016for CP showedpatent stents in the prox/mid LAD and mid LCx/OM1 with mild diffuse restenosis of the LAD/Diag,mild disease in the RCA with mild LV dysfunction with EF 45-50%), HTN, dyslipidemiaand persistentAF.    She is here today for followup and is doing well.  She denies any chest pain or pressure, SOB, DOE, PND, orthopnea, LE edema, dizziness, palpitations or syncope. She is compliant with her meds and is tolerating meds with no SE.    Past Medical History:  Diagnosis Date  . Aortic stenosis 02/16/2016   mild by echo 2018  . Arthritis    "back; hands" (2012/08/01)  . CAD (coronary atherosclerotic disease)    s/ PCI left circ/OM 09/1999, cutting balloon PCI mid LAD 08/2001, , chronically untreated tortuous lateral marginal branch, DES to prox to mid LAD and DES to mid circ 10/13,  patent stents in the prox/mid LAD and mid LCx/OM1 with mild diffuse restenosis of the LAD/Diag, mild disease in the RCA with mild LV dysfunction with EF 45-50%  . Depression    "wife died Feb 16, 2012" (08-01-12)  . Diabetic peripheral angiopathy ([place])   . GERD (gastroesophageal reflux disease)   . High cholesterol    intolerant to Lipitor/Crestor  . History of blood transfusion 1990's   S/P back OR  . Hypertension   . Hypothyroidism   . Persistent atrial fibrillation ([place])   . Type II diabetes mellitus ([place])   . Wears glasses     Past Surgical History:  Procedure Laterality Date  . BACK SURGERY    . BACK SURGERY    . CARDIAC CATHETERIZATION N/A  09/16/2015   Procedure: Left Heart Cath and Coronary Angiography;  Surgeon: Burnell Blanks, MD;  Location: [facility];  Service: Cardiovascular;  Laterality: N/A;  . CHOLECYSTECTOMY  2012  . CORONARY ANGIOPLASTY WITH STENT PLACEMENT  2000; August 01, 2012   "3 + 2; total of 5" (2012/08/01)  . [place] SURGERY  1997  . PERCUTANEOUS CORONARY STENT INTERVENTION (PCI-S) N/A 01-Aug-2012   Procedure: PERCUTANEOUS CORONARY STENT INTERVENTION (PCI-S);  Surgeon: Jettie Booze, MD;  Location: [facility] C[facility];  Service: Cardiovascular;  Laterality: N/A;  . POSTERIOR FUSION LUMBAR SPINE  ~ 1998    Current Medications: No outpatient medications have been marked as taking for the 11/30/18 encounter (Office Visit) with Riley Margarita, MD.     Allergies:   Patient has no known allergies.   Social History   Socioeconomic History  . Marital status: Widowed    Spouse name: Not on file  . Number of children: Not on file  . Years of education: Not on file  . Highest education level: Not on file  Occupational History  . Not on file  Social Needs  . Financial resource strain: Not on file  . Food insecurity:    Worry: Not on file    Inability: Not on file  . Transportation needs:    Medical: Not on file  Non-medical: Not on file  Tobacco Use  . Smoking status: Former Smoker    Packs/day: 1.00    Years: 40.00    Pack years: 40.00    Types: Cigarettes    Last attempt to quit: 09/26/1989    Years since quitting: 29.2  . Smokeless tobacco: Never Used  Substance and Sexual Activity  . Alcohol use: Yes    Comment: 07/12/2012 "daquiri maybe 3X/yr"  . Drug use: No  . Sexual activity: Never  Lifestyle  . Physical activity:    Days per week: Not on file    Minutes per session: Not on file  . Stress: Not on file  Relationships  . Social connections:    Talks on phone: Not on file    Gets together: Not on file    Attends religious service: Not on file    Active member of club  or organization: Not on file    Attends meetings of clubs or organizations: Not on file    Relationship status: Not on file  Other Topics Concern  . Not on file  Social History Narrative  . Not on file     Family History: The patient's family history includes Cancer in his father and mother.  ROS:   Please see the history of present illness.    ROS  All other systems reviewed and negative.   EKGs/Labs/Other Studies Reviewed:    The following studies were reviewed today: none  EKG:  EKG is not ordered today.    Recent Labs: 05/03/2018: Hemoglobin 13.1; Platelets 135   Recent Lipid Panel    Component Value Date/Time   CHOL 116 04/28/2017 0749   TRIG 103 04/28/2017 0749   HDL 37 (L) 04/28/2017 0749   CHOLHDL 3.1 04/28/2017 0749   CHOLHDL 2.9 02/22/2016 0825   VLDL 13 02/22/2016 0825   LDLCALC 58 04/28/2017 0749    Physical Exam:    VS:  BP 130/66   Pulse (!) 57   Ht 5\' 11"  (1.803 m)   Wt 191 lb (86.6 kg)   BMI 26.64 kg/m     Wt Readings from Last 3 Encounters:  11/30/18 191 lb (86.6 kg)  05/03/18 200 lb 9.6 oz (91 kg)  10/31/17 201 lb 9.6 oz (91.4 kg)     GEN:  Well nourished, well developed in no acute distress HEENT: Normal NECK: No JVD; No carotid bruits LYMPHATICS: No lymphadenopathy CARDIAC: RRR, no murmurs, rubs, gallops RESPIRATORY:  Clear to auscultation without rales, wheezing or rhonchi  ABDOMEN: Soft, non-tender, non-distended MUSCULOSKELETAL:  No edema; No deformity  SKIN: Warm and dry NEUROLOGIC:  Alert and oriented x 3 PSYCHIATRIC:  Normal affect   ASSESSMENT:    1. Atherosclerosis of native coronary artery of native heart without angina pectoris   2. Essential hypertension   3. Persistent atrial fibrillation ([place])   4. Nonrheumatic aortic valve stenosis   5. Dyslipidemia    PLAN:    In order of problems listed above:  1.  ASCAD - cath12/2016for CP showedpatent stents in the prox/mid LAD and mid LCx/OM1 with mild diffuse  restenosis of the LAD/Diag,mild disease in the RCA.  He has not had any anginal symptoms since I saw him last.  He is not on aspirin due to warfarin.  He will continue on Imdur 60 mg daily and statin.  2.  HTN -BP is well controlled on exam today.  He will continue on amlodipine 5 mg daily, terazosin 2 mg at bedtime and HCTZ  50 mg daily.  His creatinine was stable at 1.1 last May.  I will repeat a bmet.  3.  Persistent atrial fibrillation -he is maintaining normal sinus rhythm on exam.  He has not had any bleeding problems on warfarin.  He will continue on amiodarone 200 mg daily.  I will check a TSH and ALT .  He is also due for PFTs with DLCO.  4.  Aortic stenosis -echo 04/21/2017 showed mild aortic stenosis.  There was also mildly dilated aortic root at 38 mm.  I will repeat echo to reassess this.   5.  Hyperlipidemia -LDL goal is less than 70.  His last LDL was 56 on 02/15/2018.  I will repeat an FLP and ALT.  Continue on Pravachol 40 mg daily.   Medication Adjustments/Labs and Tests Ordered: Current medicines are reviewed at length with the patient today.  Concerns regarding medicines are outlined above.  Orders Placed This Encounter  Procedures  . Basic metabolic panel  . CBC  . Lipid panel  . Hepatic function panel  . ECHOCARDIOGRAM COMPLETE  . Pulmonary function test   No orders of the defined types were placed in this encounter.   Signed, Fransico Him, MD  12/01/2018 3:42 PM    [place]

## 2018-12-03 ENCOUNTER — Other Ambulatory Visit: Payer: Medicare Other | Admitting: *Deleted

## 2018-12-03 DIAGNOSIS — I4819 Other persistent atrial fibrillation: Secondary | ICD-10-CM

## 2018-12-03 DIAGNOSIS — E785 Hyperlipidemia, unspecified: Secondary | ICD-10-CM

## 2018-12-03 DIAGNOSIS — I2510 Atherosclerotic heart disease of native coronary artery without angina pectoris: Secondary | ICD-10-CM

## 2018-12-03 LAB — BASIC METABOLIC PANEL
BUN/Creatinine Ratio: 14 (ref 10–24)
BUN: 13 mg/dL (ref 8–27)
CO2: 23 mmol/L (ref 20–29)
Calcium: 9.3 mg/dL (ref 8.6–10.2)
Chloride: 98 mmol/L (ref 96–106)
Creatinine, Ser: 0.96 mg/dL (ref 0.76–1.27)
GFR calc Af Amer: 86 mL/min/{1.73_m2} (ref >59)
GFR calc non Af Amer: 74 mL/min/{1.73_m2} (ref >59)
Glucose: 283 mg/dL — ABNORMAL HIGH (ref 65–99)
Potassium: 4.2 mmol/L (ref 3.5–5.2)
Sodium: 136 mmol/L (ref 134–144)

## 2018-12-03 LAB — LIPID PANEL
CHOLESTEROL TOTAL: 109 mg/dL (ref 100–199)
Chol/HDL Ratio: 2.7 ratio (ref 0.0–5.0)
HDL: 41 mg/dL (ref >39)
LDL CALC: 51 mg/dL (ref 0–99)
Triglycerides: 85 mg/dL (ref 0–149)
VLDL Cholesterol Cal: 17 mg/dL (ref 5–40)

## 2018-12-03 LAB — HEPATIC FUNCTION PANEL
ALT: 21 IU/L (ref 0–44)
AST: 21 IU/L (ref 0–40)
Albumin: 4.3 g/dL (ref 3.7–4.7)
Alkaline Phosphatase: 61 IU/L (ref 39–117)
Bilirubin Total: 1.2 mg/dL (ref 0.0–1.2)
Bilirubin, Direct: 0.33 mg/dL (ref 0.00–0.40)
Total Protein: 6.5 g/dL (ref 6.0–8.5)

## 2018-12-03 LAB — CBC
HEMOGLOBIN: 13.2 g/dL (ref 13.0–17.7)
Hematocrit: 39.5 % (ref 37.5–51.0)
MCH: 30.1 pg (ref 26.6–33.0)
MCHC: 33.4 g/dL (ref 31.5–35.7)
MCV: 90 fL (ref 79–97)
Platelets: 144 10*3/uL — ABNORMAL LOW (ref 150–450)
RBC: 4.38 x10E6/uL (ref 4.14–5.80)
RDW: 14.5 % (ref 11.6–15.4)
WBC: 4.3 10*3/uL (ref 3.4–10.8)

## 2018-12-04 ENCOUNTER — Other Ambulatory Visit: Payer: Medicare Other

## 2018-12-13 ENCOUNTER — Other Ambulatory Visit: Payer: Self-pay

## 2018-12-13 ENCOUNTER — Ambulatory Visit (HOSPITAL_COMMUNITY): Payer: Medicare Other | Attending: Cardiology

## 2018-12-13 DIAGNOSIS — I350 Nonrheumatic aortic (valve) stenosis: Secondary | ICD-10-CM | POA: Diagnosis not present

## 2018-12-14 ENCOUNTER — Other Ambulatory Visit: Payer: Self-pay

## 2018-12-14 ENCOUNTER — Ambulatory Visit (INDEPENDENT_AMBULATORY_CARE_PROVIDER_SITE_OTHER): Payer: Medicare Other | Admitting: Internal Medicine

## 2018-12-14 DIAGNOSIS — I4819 Other persistent atrial fibrillation: Secondary | ICD-10-CM | POA: Diagnosis not present

## 2018-12-14 LAB — PULMONARY FUNCTION TEST
DL/VA % pred: 129 %
DL/VA: 4.99 ml/min/mmHg/L
DLCO cor % pred: 116 %
DLCO cor: 30.09 ml/min/mmHg
DLCO unc % pred: 111 %
DLCO unc: 28.83 ml/min/mmHg
FEF 25-75 Post: 2.02 L/sec
FEF 25-75 Pre: 1.88 L/sec
FEF2575-%Change-Post: 7 %
FEF2575-%Pred-Post: 93 %
FEF2575-%Pred-Pre: 87 %
FEV1-%Change-Post: 0 %
FEV1-%Pred-Post: 82 %
FEV1-%Pred-Pre: 82 %
FEV1-PRE: 2.57 L
FEV1-Post: 2.56 L
FEV1FVC-%Change-Post: 5 %
FEV1FVC-%Pred-Pre: 103 %
FEV6-%Change-Post: -4 %
FEV6-%PRED-PRE: 83 %
FEV6-%Pred-Post: 80 %
FEV6-PRE: 3.43 L
FEV6-Post: 3.28 L
FEV6FVC-%Change-Post: 1 %
FEV6FVC-%Pred-Post: 106 %
FEV6FVC-%Pred-Pre: 105 %
FVC-%CHANGE-POST: -5 %
FVC-%PRED-POST: 74 %
FVC-%Pred-Pre: 79 %
FVC-POST: 3.28 L
FVC-Pre: 3.46 L
POST FEV6/FVC RATIO: 100 %
Post FEV1/FVC ratio: 78 %
Pre FEV1/FVC ratio: 74 %
Pre FEV6/FVC Ratio: 99 %
RV % pred: 130 %
RV: 3.62 L
TLC % pred: 93 %
TLC: 6.97 L

## 2018-12-14 NOTE — Progress Notes (Signed)
Full PFT performed today. °

## 2018-12-18 ENCOUNTER — Telehealth: Payer: Self-pay

## 2018-12-18 DIAGNOSIS — Q2112 Patent foramen ovale: Secondary | ICD-10-CM

## 2018-12-18 DIAGNOSIS — I350 Nonrheumatic aortic (valve) stenosis: Secondary | ICD-10-CM

## 2018-12-18 DIAGNOSIS — Q211 Atrial septal defect: Secondary | ICD-10-CM

## 2018-12-18 DIAGNOSIS — R942 Abnormal results of pulmonary function studies: Secondary | ICD-10-CM

## 2018-12-18 NOTE — Telephone Encounter (Signed)
LMTCB

## 2018-12-18 NOTE — Telephone Encounter (Signed)
-----   Message from [name], MD sent at 12/16/2018 10:56 PM EDT ----- PFTs normal except for increased diffusion capacity - please get limited echo with agitated saline microcavitation bubbles to rule out PFO

## 2018-12-18 NOTE — Telephone Encounter (Signed)
-----   Message from [name], MD sent at 12/16/2018 11:59 PM EDT ----- Echo showed normal LVF with mildly thickened heart muscle, mild LA enlargement, mildly calcified MV, mild to moderate AS - repeat echo in 1 year to assess stability of AV

## 2018-12-20 NOTE — Telephone Encounter (Signed)
Spoke with the patient, he expressed understanding about his PFT and echo results. He stated he would like to have the echo with bubble study in April.   What diagnosis should we use for the limited echo with bubble?

## 2018-12-20 NOTE — Telephone Encounter (Signed)
Rule out PFO

## 2018-12-20 NOTE — Telephone Encounter (Signed)
Orders placed, message sent to [facility].

## 2018-12-20 NOTE — Telephone Encounter (Signed)
New Message ° ° ° °Patient returning phone call  °

## 2018-12-20 NOTE — Telephone Encounter (Signed)
There is not a rule out PFO diagnosis, do you want to place it under PFO?

## 2018-12-20 NOTE — Telephone Encounter (Signed)
yes

## 2019-01-24 ENCOUNTER — Telehealth: Payer: Self-pay | Admitting: Cardiovascular Disease

## 2019-01-24 NOTE — Telephone Encounter (Signed)
   Primary Cardiologist:  [facility]   Patient contacted.  History reviewed.    I have talked to patient  and I think this echo can be safely postponed.   He agrees.   priority level 3    No symptoms to suggest any unstable cardiac conditions.  Based on discussion, with current pandemic situation, we will be postponing this appointment for [name] with a plan for f/u in  12  wks or sooner if feasible/necessary.  If symptoms change, he has been instructed to contact our office.   Routing to C19 CANCEL pool for tracking (P CV DIV CV19 CANCEL - reason for visit "other.") and assigning priority ( 3 = >12 wks).   Mertie Moores, MD  01/24/2019 6:28 PM         .

## 2019-01-31 ENCOUNTER — Other Ambulatory Visit (HOSPITAL_COMMUNITY): Payer: Medicare Other

## 2019-02-18 ENCOUNTER — Telehealth: Payer: Self-pay | Admitting: Cardiology

## 2019-02-18 NOTE — Telephone Encounter (Signed)
New Message   B/p readings:   02/14/19 morning 107/56 hr 55  146/72 hr 60 bedtime 02/15/19 morning 109/59 hr 57 hr 136/67 hr 57 bedtime 02/16/19 morning 110/62 hr 57 130/72 hr 56  bedtime 02/17/19 morning 112/58 hr 55 154/77 hr 56 bedtime 02/18/19 morring 105/56 hr 56 bedtime

## 2019-02-18 NOTE — Telephone Encounter (Signed)
BP readings look good - is patient having symptoms?

## 2019-02-20 NOTE — Telephone Encounter (Signed)
The patient stopped taking amlodipine last Wednesday and these are blood pressures without being on the medication. He stated he sometimes feels weak and no energy, but he feels better than he did.

## 2019-02-20 NOTE — Telephone Encounter (Signed)
Is he still taking HCTZ

## 2019-02-21 NOTE — Telephone Encounter (Signed)
PT AWARE AND WILL CALL NEXT WEEK WITH READINGS .Riley Jordan

## 2019-02-21 NOTE — Telephone Encounter (Signed)
Please have patient decrease HCTZ to 25mg  daily and repeat BP daily for a week and call with results and let us know if weakness improved

## 2019-02-21 NOTE — Telephone Encounter (Signed)
Per pt is taking all meds but the Amlodipine .Adonis Housekeeper

## 2019-02-28 ENCOUNTER — Telehealth: Payer: Self-pay | Admitting: Cardiology

## 2019-02-28 NOTE — Telephone Encounter (Signed)
  Pt c/o BP issue: STAT if pt c/o blurred vision, one-sided weakness or slurred speech  1. What are your last 5 BP readings?   5/22  9am 117/62 p 57 5/23  9am 109/61 p 59  11pm 153/74 p 56 5/24  8am 115/59 p 56  11pm 139/70 p 60 5/25 10am  92/52 p 62  11pm 153/81 p 60 5/26  9am 117/63 p 59  10pm 163/88 p 60 5/27  9am 112/58 p 73  11pm 155/83 p 61 5/28  9am 123/65 p 54   2. Are you having any other symptoms (ex. Dizziness, headache, blurred vision, passed out)?na  3. What is your BP issue? Patient was asked to track his bp readings and call with results. He states he is not taking amlodipine and half of his water pill

## 2019-03-01 NOTE — Telephone Encounter (Signed)
Spoke with the patient, since stopping amlodipine and HCTZ 25 mg, daily, he has felt good, he just noticed at night his pressure is higher.

## 2019-03-05 MED ORDER — AMLODIPINE BESYLATE 2.5 MG PO TABS: 2.5000 mg | ORAL_TABLET | Freq: Every day | ORAL | 3 refills | Status: DC

## 2019-03-05 NOTE — Telephone Encounter (Signed)
He is not taking amlodipine and he is taking half of his HCTZ. He takes HCTZ in the morning. Should he start back?

## 2019-03-05 NOTE — Telephone Encounter (Signed)
His med list still states Amlodipine 5mg  daily.  Have him go back to amlodipine 2.5mg  at lunchtime and check BP twice daily for a week and call with the results

## 2019-03-05 NOTE — Telephone Encounter (Signed)
The patient does not add additional salt, his food is home cooked. I advised to look for low or no salt added ingredients to help with his reduction in salt.

## 2019-03-05 NOTE — Telephone Encounter (Signed)
Have him change the time he takes his amlodipine to lunchtime and see if afternoon BP readings improve

## 2019-03-05 NOTE — Telephone Encounter (Signed)
Spoke with the patient, he expressed understanding about taking amlodipine 2.5 mg, daily at lunch time. He will call the office in a week with his blood pressure values.

## 2019-03-05 NOTE — Telephone Encounter (Signed)
Please find out if he is salting his food any

## 2019-03-14 ENCOUNTER — Telehealth: Payer: Self-pay | Admitting: Cardiology

## 2019-03-14 NOTE — Telephone Encounter (Signed)
BPs look good.  Please find out if he is having any symptoms

## 2019-03-14 NOTE — Telephone Encounter (Signed)
New message   B/p readings:   6/3  128/66 hr 64 6/4 114/65 hr 61   129/69 hr 59 6/5 104/59 hr 66  6/6 126/66 hr 56  142/79 hr 55 6/7 112/74 hr 87   144/76 hr 59 6/8  143/78 hr 61  153/73 hr 55  6/9 140/72 hr 64  6/10 105/58 hr 56 153.80 hr 58  6/11 11/59 hr 61

## 2019-03-15 NOTE — Telephone Encounter (Signed)
The patient expressed understanding and has been feeling good since the medication changes. He had no further questions.

## 2019-04-02 ENCOUNTER — Telehealth (HOSPITAL_COMMUNITY): Payer: Self-pay

## 2019-04-02 NOTE — Telephone Encounter (Signed)
LMTCB COVID prescreening for echo. Left echo appt details per DPR. 

## 2019-04-02 NOTE — Telephone Encounter (Signed)

## 2019-04-03 ENCOUNTER — Other Ambulatory Visit: Payer: Self-pay

## 2019-04-03 ENCOUNTER — Ambulatory Visit (HOSPITAL_COMMUNITY): Payer: Medicare Other | Attending: Cardiovascular Disease

## 2019-04-03 DIAGNOSIS — R942 Abnormal results of pulmonary function studies: Secondary | ICD-10-CM | POA: Diagnosis not present

## 2019-04-03 DIAGNOSIS — Q2112 Patent foramen ovale: Secondary | ICD-10-CM

## 2019-04-03 DIAGNOSIS — Q211 Atrial septal defect: Secondary | ICD-10-CM | POA: Insufficient documentation

## 2019-04-26 ENCOUNTER — Other Ambulatory Visit: Payer: Self-pay | Admitting: Cardiology

## 2019-05-16 ENCOUNTER — Ambulatory Visit (INDEPENDENT_AMBULATORY_CARE_PROVIDER_SITE_OTHER): Payer: Medicare Other | Admitting: Neurology

## 2019-05-16 ENCOUNTER — Encounter: Payer: Self-pay | Admitting: Neurology

## 2019-05-16 ENCOUNTER — Telehealth: Payer: Self-pay | Admitting: Neurology

## 2019-05-16 ENCOUNTER — Other Ambulatory Visit: Payer: Self-pay

## 2019-05-16 VITALS — BP 120/70 | HR 67 | Temp 96.8°F | Wt 189.4 lb

## 2019-05-16 DIAGNOSIS — I2510 Atherosclerotic heart disease of native coronary artery without angina pectoris: Secondary | ICD-10-CM | POA: Diagnosis not present

## 2019-05-16 DIAGNOSIS — G44209 Tension-type headache, unspecified, not intractable: Secondary | ICD-10-CM

## 2019-05-16 DIAGNOSIS — M542 Cervicalgia: Secondary | ICD-10-CM | POA: Diagnosis not present

## 2019-05-16 MED ORDER — DIVALPROEX SODIUM ER 500 MG PO TB24: 500.0000 mg | ORAL_TABLET | Freq: Every day | ORAL | 2 refills | Status: DC

## 2019-05-16 NOTE — Telephone Encounter (Signed)
Medicare/bcbs supp order sent to GI. No auth they will reach out to the patient to schedule.  

## 2019-05-16 NOTE — Progress Notes (Signed)
[facility] [address] [place]. [place] [address] [phone]       OFFICE CONSULT NOTE  Mr. KEILEN KAHL Date of Birth:  05/02/1936 Medical Record Number:  [id number]   Referring MD:  Melida Quitter Reason for Referral: Headache  HPI: Mr. Alvester Chou is a 83 year old Caucasian male seen today for initial office consultation visit for headaches.  History is obtained from the patient and review of referral notes and electronic medical records.  I personally reviewed imaging films in PACS.  He states that he started having headaches since June of this year.  He describes them as bifrontal pressure-like constant headaches.  These appear to be more prominent in the morning when he arises.  They are not as severe and he reports them as 2 or 3/10 in severity.  There may last for 4 to 5 hours.  It is pressure-like in nature.  There is no accompanying nausea, vomiting, light or sound sensitivity.  He is able to continue to work despite the headaches.  He had an episode of sinusitis in January and did have some mild headaches at that time and after treatment with antibiotics they have resolved.  So when he first saw his primary physician in June for these headaches he was given another course of antibiotics which did not help.  He saw ENT physician Dr. Redmond Baseman who read the CT scan of his paranasal sinuses on 03/28/2019 which showed a small polyp which was not felt to be responsible for his symptoms.  Patient denies any recent back injury, surgery or spinal tap.  He does complain of tightness in his posterior neck muscles and back of his head.  He in fact has restriction of neck movements on either side.  He may have some arthritis in his spine.  He denies any radicular pain gait or balance problems.  There is no prior history of migraines, TIA, strokes, seizures, close head injury or loss of consciousness.  He does have a longstanding sebaceous cyst on the skin of the back of his neck which he  feels is unchanged and not tender or bothersome.  He has not had any brain imaging study done.  He has not been taking any medications on a regular basis for these headaches.  ROS:   14 system review of systems is positive for headache, neck pain, restriction of neck movements, decreased hearing, skin swelling and all other systems negative  PMH:  Past Medical History:  Diagnosis Date   Aortic stenosis 02/16/2016   mild by echo 2018   Arthritis    "back; hands" (2012-08-08)   CAD (coronary atherosclerotic disease)    s/ PCI left circ/OM 09/1999, cutting balloon PCI mid LAD 08/2001, , chronically untreated tortuous lateral marginal branch, DES to prox to mid LAD and DES to mid circ 10/13,  patent stents in the prox/mid LAD and mid LCx/OM1 with mild diffuse restenosis of the LAD/Diag, mild disease in the RCA with mild LV dysfunction with EF 45-50%   Depression    "wife died 2012-02-22" (08/08/12)   Diabetic peripheral angiopathy ([facility])    GERD (gastroesophageal reflux disease)    High cholesterol    intolerant to Lipitor/Crestor   History of blood transfusion 1990's   S/P back OR   Hypertension    Hypothyroidism    Persistent atrial fibrillation    Type II diabetes mellitus ([place])    Wears glasses     Social History:  Social History   Socioeconomic History  Marital status: Widowed    Spouse name: Not on file   Number of children: Not on file   Years of education: Not on file   Highest education level: Not on file  Occupational History   Not on file  Social Needs   Financial resource strain: Not on file   Food insecurity    Worry: Not on file    Inability: Not on file   Transportation needs    Medical: Not on file    Non-medical: Not on file  Tobacco Use   Smoking status: Former Smoker    Packs/day: 1.00    Years: 40.00    Pack years: 40.00    Types: Cigarettes    Quit date: 09/26/1989    Years since quitting: 29.6   Smokeless tobacco: Never  Used  Substance and Sexual Activity   Alcohol use: Yes    Comment: 07/12/2012 "daquiri maybe 3X/yr"   Drug use: No   Sexual activity: Never  Lifestyle   Physical activity    Days per week: Not on file    Minutes per session: Not on file   Stress: Not on file  Relationships   Social connections    Talks on phone: Not on file    Gets together: Not on file    Attends religious service: Not on file    Active member of club or organization: Not on file    Attends meetings of clubs or organizations: Not on file    Relationship status: Not on file   Intimate partner violence    Fear of current or ex partner: Not on file    Emotionally abused: Not on file    Physically abused: Not on file    Forced sexual activity: Not on file  Other Topics Concern   Not on file  Social History Narrative   Not on file    Medications:   Current Outpatient Medications on File Prior to Visit  Medication Sig Dispense Refill   acetaminophen (TYLENOL) 500 MG tablet Take 500-1,000 mg by mouth 2 (two) times daily. Take 1 tablet (500 mg) by mouth every morning and 2 tablets (1000 mg) at bedtime     amiodarone (PACERONE) 200 MG tablet TAKE 1 TABLET BY MOUTH DAILY 90 tablet 3   amLODipine (NORVASC) 2.5 MG tablet Take 1 tablet (2.5 mg total) by mouth daily. 90 tablet 3   fluticasone (FLONASE) 50 MCG/ACT nasal spray      hydrochlorothiazide (HYDRODIURIL) 50 MG tablet Take 25 mg by mouth daily.   0   insulin regular (NOVOLIN R) 100 units/mL injection Inject 3 to 8 units into skin with meals per sliding scale     isosorbide mononitrate (IMDUR) 60 MG 24 hr tablet TAKE 1 AND 1/2 TABLETS BY MOUTH AT BEDTIME 135 tablet 1   LANTUS SOLOSTAR 100 UNIT/ML Solostar Pen Inject 42 Units into the skin at bedtime.     levothyroxine (SYNTHROID, LEVOTHROID) 175 MCG tablet Take 175 mcg by mouth daily before breakfast.      meclizine (ANTIVERT) 25 MG tablet Take 25 mg by mouth 3 (three) times daily.       metFORMIN (GLUCOPHAGE) 1000 MG tablet Take 1,000 mg by mouth 2 (two) times daily.     nitroGLYCERIN (NITROSTAT) 0.4 MG SL tablet Place 0.4 mg under the tongue every 5 (five) minutes as needed for chest pain.     pravastatin (PRAVACHOL) 40 MG tablet TAKE 1 TABLET BY MOUTH DAILY IN THE EVENING 90 tablet  2   ranitidine (ZANTAC) 150 MG capsule Take 150 mg by mouth 2 (two) times daily.     terazosin (HYTRIN) 10 MG capsule      warfarin (COUMADIN) 5 MG tablet Take 2.5-5 mg by mouth daily with supper. Take 1 tablet (5 mg) by mouth 1st day, then take 1/2 tablet (2.5 mg) on 2nd and 3rd day, then repeat     traMADol (ULTRAM) 50 MG tablet      Current Facility-Administered Medications on File Prior to Visit  Medication Dose Route Frequency Provider Last Rate Last Dose   aminophylline injection 75 mg  75 mg Intravenous BID PRN Dorothy Spark, MD   75 mg at 08/31/15 1005    Allergies:  No Known Allergies  Physical Exam General: Frail elderly Caucasian male., seated, in no evident distress Head: head normocephalic and atraumatic.   Neck: supple with no carotid or supraclavicular bruits Cardiovascular: regular rate and rhythm, no murmurs Musculoskeletal: no deformity Skin:  no rash/petichiae large 2 x 2 centimeter sebaceous cyst in the posterior neck in the midline at around C5-C6 Vascular:  Normal pulses all extremities  Neurologic Exam Mental Status: Awake and fully alert. Oriented to place and time. Recent and remote memory intact. Attention span, concentration and fund of knowledge appropriate. Mood and affect appropriate.  Cranial Nerves: Fundoscopic exam reveals sharp disc margins. Pupils equal, briskly reactive to light. Extraocular movements full without nystagmus. Visual fields full to confrontation. Hearing diminished significantly in the left ear.. Facial sensation intact. Face, tongue, palate moves normally and symmetrically.  Motor: Normal bulk and tone. Normal strength in all  tested extremity muscles. Sensory.: intact to touch , pinprick , position and vibratory sensation.  Coordination: Rapid alternating movements normal in all extremities. Finger-to-nose and heel-to-shin performed accurately bilaterally. Gait and Station: Arises from chair without difficulty. Stance is normal. Gait demonstrates normal stride length and balance . Able to heel, toe and tandem walk with mild  difficulty.  Reflexes: 1+ and symmetric. Toes downgoing.      ASSESSMENT: 83 year old Caucasian male with new onset daily headaches likely muscle tension headaches as well as neck pain which appears musculoskeletal in nature.     PLAN: I had a long discussion with the patient regarding his new onset of daily headaches and posterior neck pain which sound like muscle tension headaches.  I recommend checking MRI scan of the brain with and without contrast as well as MRI of the neck without contrast to rule out any structural lesions as well as trial of Depakote ER 500 mg daily.  I also recommend he do regular neck stretching exercises.  I advised him to limit using tramadol to not more than 2 or 3 days a week to avoid analgesic rebound.  Greater than 50% time during this 45-minute consultation visit was spent on counseling and coordination of care about his headaches and answering questions he will return for follow-up in the future in 2 months or call earlier if necessary. Antony Contras, MD  [facility] [address] [place], [place] [address]  Phone [phone] Fax [phone] Note: This document was prepared with digital dictation and possible smart phrase technology. Any transcriptional errors that result from this process are unintentional.

## 2019-05-16 NOTE — Patient Instructions (Signed)
I had a long discussion with the patient regarding his new onset of daily headaches and posterior neck pain which sound like muscle tension headaches.  I recommend checking MRI scan of the brain with and without contrast as well as MRI of the neck without contrast to rule out any structural lesions as well as trial of Depakote ER 500 mg daily.  I also recommend he do regular neck stretching exercises.  I advised him to limit using tramadol to not more than 2 or 3 days a week to avoid analgesic rebound.  He will return for follow-up in the future in 2 months or call earlier if necessary.   Neck Exercises Ask your health care provider which exercises are safe for you. Do exercises exactly as told by your health care provider and adjust them as directed. It is normal to feel mild stretching, pulling, tightness, or discomfort as you do these exercises. Stop right away if you feel sudden pain or your pain gets worse. Do not begin these exercises until told by your health care provider. Neck exercises can be important for many reasons. They can improve strength and maintain flexibility in your neck, which will help your upper back and prevent neck pain. Stretching exercises Rotation neck stretching  1. Sit in a chair or stand up. 2. Place your feet flat on the floor, shoulder width apart. 3. Slowly turn your head (rotate) to the right until a slight stretch is felt. Turn it all the way to the right so you can look over your right shoulder. Do not tilt or tip your head. 4. Hold this position for 10-30 seconds. 5. Slowly turn your head (rotate) to the left until a slight stretch is felt. Turn it all the way to the left so you can look over your left shoulder. Do not tilt or tip your head. 6. Hold this position for 10-30 seconds. Repeat __________ times. Complete this exercise __________ times a day. Neck retraction 1. Sit in a sturdy chair or stand up. 2. Look straight ahead. Do not bend your neck. 3. Use  your fingers to push your chin backward (retraction). Do not bend your neck for this movement. Continue to face straight ahead. If you are doing the exercise properly, you will feel a slight sensation in your throat and a stretch at the back of your neck. 4. Hold the stretch for 1-2 seconds. Repeat __________ times. Complete this exercise __________ times a day. Strengthening exercises Neck press 1. Lie on your back on a firm bed or on the floor with a pillow under your head. 2. Use your neck muscles to push your head down on the pillow and straighten your spine. 3. Hold the position as well as you can. Keep your head facing up (in a neutral position) and your chin tucked. 4. Slowly count to 5 while holding this position. Repeat __________ times. Complete this exercise __________ times a day. Isometrics These are exercises in which you strengthen the muscles in your neck while keeping your neck still (isometrics). 1. Sit in a supportive chair and place your hand on your forehead. 2. Keep your head and face facing straight ahead. Do not flex or extend your neck while doing isometrics. 3. Push forward with your head and neck while pushing back with your hand. Hold for 10 seconds. 4. Do the sequence again, this time putting your hand against the back of your head. Use your head and neck to push backward against the hand pressure.  5. Finally, do the same exercise on either side of your head, pushing sideways against the pressure of your hand. Repeat __________ times. Complete this exercise __________ times a day. Prone head lifts 1. Lie face-down (prone position), resting on your elbows so that your chest and upper back are raised. 2. Start with your head facing downward, near your chest. Position your chin either on or near your chest. 3. Slowly lift your head upward. Lift until you are looking straight ahead. Then continue lifting your head as far back as you can comfortably stretch. 4. Hold your  head up for 5 seconds. Then slowly lower it to your starting position. Repeat __________ times. Complete this exercise __________ times a day. Supine head lifts 1. Lie on your back (supine position), bending your knees to point to the ceiling and keeping your feet flat on the floor. 2. Lift your head slowly off the floor, raising your chin toward your chest. 3. Hold for 5 seconds. Repeat __________ times. Complete this exercise __________ times a day. Scapular retraction 1. Stand with your arms at your sides. Look straight ahead. 2. Slowly pull both shoulders (scapulae) backward and downward (retraction) until you feel a stretch between your shoulder blades in your upper back. 3. Hold for 10-30 seconds. 4. Relax and repeat. Repeat __________ times. Complete this exercise __________ times a day. Contact a health care provider if:  Your neck pain or discomfort gets much worse when you do an exercise.  Your neck pain or discomfort does not improve within 2 hours after you exercise. If you have any of these problems, stop exercising right away. Do not do the exercises again unless your health care provider says that you can. Get help right away if:  You develop sudden, severe neck pain. If this happens, stop exercising right away. Do not do the exercises again unless your health care provider says that you can. This information is not intended to replace advice given to you by your health care provider. Make sure you discuss any questions you have with your health care provider. Document Released: 08/31/2015 Document Revised: 07/18/2018 Document Reviewed: 07/18/2018 Elsevier Patient Education  2020 [employer].  Tension Headache, Adult A tension headache is a feeling of pain, pressure, or aching in the head that is often felt over the front and sides of the head. The pain can be dull, or it can feel tight (constricting). There are two types of tension headache:  Episodic tension headache.  This is when the headaches happen fewer than 15 days a month.  Chronic tension headache. This is when the headaches happen more than 15 days a month during a 73-month period. A tension headache can last from 30 minutes to several days. It is the most common kind of headache. Tension headaches are not normally associated with nausea or vomiting, and they do not get worse with physical activity. What are the causes? The exact cause of this condition is not known. Tension headaches are often triggered by stress, anxiety, or depression. Other triggers include:  Alcohol.  Too much caffeine or caffeine withdrawal.  Respiratory infections, such as colds, flu, or sinus infections.  Dental problems or teeth clenching.  Tiredness (fatigue).  Holding your head and neck in the same position for a long period of time, such as while using a computer.  Smoking.  Arthritis of the neck. What are the signs or symptoms? Symptoms of this condition include:  A feeling of pressure or tightness around the head.  Dull, aching head pain.  Pain over the front and sides of the head.  Tenderness in the muscles of the head, neck, and shoulders. How is this diagnosed? This condition may be diagnosed based on your symptoms, your medical history, and a physical exam. If your symptoms are severe or unusual, you may have imaging tests, such as a CT scan or an MRI of your head. Your vision may also be checked. How is this treated? This condition may be treated with lifestyle changes and with medicines that help relieve symptoms. Follow these instructions at home: Managing pain  Take over-the-counter and prescription medicines only as told by your health care provider.  When you have a headache, lie down in a dark, quiet room.  If directed, apply ice to the head and neck: ? Put ice in a plastic bag. ? Place a towel between your skin and the bag. ? Leave the ice on for 20 minutes, 2-3 times a day.  If  directed, apply heat to the back of your neck as often as told by your health care provider. Use the heat source that your health care provider recommends, such as a moist heat pack or a heating pad. ? Place a towel between your skin and the heat source. ? Leave the heat on for 20-30 minutes. ? Remove the heat if your skin turns bright red. This is especially important if you are unable to feel pain, heat, or cold. You may have a greater risk of getting burned. Eating and drinking  Eat meals on a regular schedule.  Limit alcohol intake to no more than 1 drink a day for nonpregnant women and 2 drinks a day for men. One drink equals 12 oz of beer, 5 oz of wine, or 1 oz of hard liquor.  Drink enough fluid to keep your urine pale yellow.  Decrease your caffeine intake, or stop using caffeine. Lifestyle  Get 7-9 hours of sleep each night, or get the amount of sleep recommended by your health care provider.  At bedtime, remove all electronic devices from your room. Electronic devices include computers, phones, and tablets.  Find ways to manage your stress. Some things that can help relieve stress include: ? Exercise. ? Deep breathing exercises. ? Yoga. ? Listening to music. ? Positive mental imagery.  Try to sit up straight and avoid tensing your muscles.  Do not use any products that contain nicotine or tobacco, such as cigarettes and e-cigarettes. If you need help quitting, ask your health care provider. General instructions   Keep all follow-up visits as told by your health care provider. This is important.  Avoid any headache triggers. Keep a headache journal to help find out what may trigger your headaches. For example, write down: ? What you eat and drink. ? How much sleep you get. ? Any change to your diet or medicines. Contact a health care provider if:  Your headache does not get better.  Your headache comes back.  You are sensitive to sounds, light, or smells because  of a headache.  You have nausea or you vomit.  Your stomach hurts. Get help right away if:  You suddenly develop a very severe headache along with any of the following: ? A stiff neck. ? Nausea and vomiting. ? Confusion. ? Weakness. ? Double vision or loss of vision. ? Shortness of breath. ? Rash. ? Unusual sleepiness. ? Fever. ? Trouble speaking. ? Pain in your eyes or ears. ? Trouble walking or balancing. ?  Feeling faint or passing out. Summary  A tension headache is a feeling of pain, pressure, or aching in the head that is often felt over the front and sides of the head.  A tension headache can last from 30 minutes to several days. It is the most common kind of headache.  This condition may be diagnosed based on your symptoms, your medical history, and a physical exam.  This condition may be treated with lifestyle changes and with medicines that help relieve symptoms. This information is not intended to replace advice given to you by your health care provider. Make sure you discuss any questions you have with your health care provider. Document Released: 09/19/2005 Document Revised: 09/01/2017 Document Reviewed: 12/30/2016 Elsevier Patient Education  2020 [employer].

## 2019-05-17 ENCOUNTER — Other Ambulatory Visit: Payer: Self-pay | Admitting: Cardiology

## 2019-05-17 DIAGNOSIS — I480 Paroxysmal atrial fibrillation: Secondary | ICD-10-CM

## 2019-06-07 ENCOUNTER — Telehealth: Payer: Self-pay

## 2019-06-07 NOTE — Telephone Encounter (Signed)

## 2019-06-11 NOTE — Progress Notes (Signed)
Virtual Visit via Video Note   This visit type was conducted due to national recommendations for restrictions regarding the COVID-19 Pandemic (e.g. social distancing) in an effort to limit this patient's exposure and mitigate transmission in our community.  Due to his co-morbid illnesses, this patient is at least at moderate risk for complications without adequate follow up.  This format is felt to be most appropriate for this patient at this time.  All issues noted in this document were discussed and addressed.  A limited physical exam was performed with this format.  Please refer to the patient's chart for his consent to telehealth for [facility].   Evaluation Performed:  Follow-up visit  This visit type was conducted due to national recommendations for restrictions regarding the COVID-19 Pandemic (e.g. social distancing).  This format is felt to be most appropriate for this patient at this time.  All issues noted in this document were discussed and addressed.  No physical exam was performed (except for noted visual exam findings with Video Visits).  Please refer to the patient's chart (MyChart message for video visits and phone note for telephone visits) for the patient's consent to telehealth for [facility].  Date:  06/12/2019   ID:  [name], DOB 12-05-1935, MRN [id number]  Patient Location:  Home  Provider location:   [place]  PCP:  Leonard Downing, MD  Cardiologist:  Fransico Him, MD Electrophysiologist:  None   Chief Complaint:  he  History of Present Illness:    Riley Jordan is a 83 y.o. male who presents via audio/video conferencing for a telehealth visit today.    Riley Jordan is a 83 y.o. male with a hx of ASCAD (cath12/2016for CP showedpatent stents in the prox/mid LAD and mid LCx/OM1 with mild diffuse restenosis of the LAD/Diag,mild disease in the RCA with mild LV dysfunction with EF 45-50%), HTN, dyslipidemiaand persistentAF.    He is here  today for followup and is doing well.  He denies any chest pain or pressure, SOB, DOE, PND, orthopnea,  dizziness, palpitations or syncope. He is concerned because his HR has been erratic with range of 49 to 90's.  Normally it runs in the 55-60bpm range. He has been feeling fatigued and is concerned that it may be the slow heart rate.  He has chronic LE edema which is stable and unchanged. He is compliant with his meds and is tolerating meds with no SE.    The patient does not have symptoms concerning for COVID-19 infection (fever, chills, cough, or new shortness of breath).    Prior CV studies:   The following studies were reviewed today:  none  Past Medical History:  Diagnosis Date  . Aortic stenosis 02/16/2016   mild by echo 2018  . Arthritis    "back; hands" (07/25/2012)  . CAD (coronary atherosclerotic disease)    s/ PCI left circ/OM 09/1999, cutting balloon PCI mid LAD 08/2001, , chronically untreated tortuous lateral marginal branch, DES to prox to mid LAD and DES to mid circ 10/13,  patent stents in the prox/mid LAD and mid LCx/OM1 with mild diffuse restenosis of the LAD/Diag, mild disease in the RCA with mild LV dysfunction with EF 45-50%  . Depression    "wife died Feb 09, 2012" (25-Jul-2012)  . Diabetic peripheral angiopathy ([place])   . GERD (gastroesophageal reflux disease)   . High cholesterol    intolerant to Lipitor/Crestor  . History of blood transfusion 1990's   S/P back OR  .  Hypertension   . Hypothyroidism   . Persistent atrial fibrillation   . Type II diabetes mellitus ([place])   . Wears glasses    Past Surgical History:  Procedure Laterality Date  . BACK SURGERY    . BACK SURGERY    . CARDIAC CATHETERIZATION N/A 09/16/2015   Procedure: Left Heart Cath and Coronary Angiography;  Surgeon: Burnell Blanks, MD;  Location: [place];  Service: Cardiovascular;  Laterality: N/A;  . CHOLECYSTECTOMY  2012  . CORONARY ANGIOPLASTY WITH STENT PLACEMENT  2000;  07/12/2012   "3 + 2; total of 5" (07/12/2012)  . [place] SURGERY  1997  . PERCUTANEOUS CORONARY STENT INTERVENTION (PCI-S) N/A 07/12/2012   Procedure: PERCUTANEOUS CORONARY STENT INTERVENTION (PCI-S);  Surgeon: Jettie Booze, MD;  Location: [facility] CATH LAB;  Service: Cardiovascular;  Laterality: N/A;  . POSTERIOR FUSION LUMBAR SPINE  ~ 1998  . TYMPANOSTOMY TUBE PLACEMENT       No outpatient medications have been marked as taking for the 06/12/19 encounter (Telemedicine) with [name], MD.     Allergies:   Patient has no known allergies.   Social History   Tobacco Use  . Smoking status: Former Smoker    Packs/day: 1.00    Years: 40.00    Pack years: 40.00    Types: Cigarettes    Quit date: 09/26/1989    Years since quitting: 29.7  . Smokeless tobacco: Never Used  Substance Use Topics  . Alcohol use: Yes    Comment: 07/12/2012 "daquiri maybe 3X/yr"  . Drug use: No     Family Hx: The patient's family history includes Cancer in his father and mother.  ROS:   Please see the history of present illness.     All other systems reviewed and are negative.   Labs/Other Tests and Data Reviewed:    Recent Labs: 12/03/2018: ALT 21; BUN 13; Creatinine, Ser 0.96; Hemoglobin 13.2; Platelets 144; Potassium 4.2; Sodium 136   Recent Lipid Panel Lab Results  Component Value Date/Time   CHOL 109 12/03/2018 07:53 AM   TRIG 85 12/03/2018 07:53 AM   HDL 41 12/03/2018 07:53 AM   CHOLHDL 2.7 12/03/2018 07:53 AM   CHOLHDL 2.9 02/22/2016 08:25 AM   LDLCALC 51 12/03/2018 07:53 AM    Wt Readings from Last 3 Encounters:  05/16/19 189 lb 6.4 oz (85.9 kg)  11/30/18 191 lb (86.6 kg)  05/03/18 200 lb 9.6 oz (91 kg)     Objective:    Vital Signs:  BP (!) 112/57   Pulse (!) 58   Ht 6' (1.829 m)   BMI 25.69 kg/m    CONSTITUTIONAL:  Well nourished, well developed male in no acute distress.  EYES: anicteric MOUTH: oral mucosa is pink RESPIRATORY: Normal respiratory effort,  symmetric expansion CARDIOVASCULAR: No peripheral edema SKIN: No rash, lesions or ulcers MUSCULOSKELETAL: no digital cyanosis NEURO: Cranial Nerves II-XII grossly intact, moves all extremities PSYCH: Intact judgement and insight.  A&O x 3, Mood/affect appropriate   ASSESSMENT & PLAN:    1.  ASCAD  - cath12/2016for CP showedpatent stents in the prox/mid LAD and mid LCx/OM1 with mild diffuse restenosis of the LAD/Diag,mild disease in the RCA.  -he has not had any angina since I saw him last -He is not on aspirin due to warfarin.   -Continue long acting nitrates and statin.   2.  HTN  -BP controlled -continue on  terazosin 10mg  at bedtime and HCTZ 50 mg daily.  -creatinine  was normal at 0.96 in March 2020.  3.  Persistent atrial fibrillation -he thinks he is maintaining NSR but is concerned that his HR is all over the place and sometimes gets into the 40's.  He has been having fatigue as well.  -no bleeding issues with warfarin -decrease Amio to 100mg  daily and get a 2 week Zio patch to assess heart rate and rule out silent afib.  -check TSH and ALT.  -up to date on PFTs  4.  Aortic stenosis  -mild to moderate AS by echo 04/2019 with mean AVG 65mmHg. -There was also mildly dilated aortic root at 38 mm.  -repeat echo 04/2020  5.  Hyperlipidemia  -LDL goal is less than 70.   -LDL was at goal at 51 12/2018 -Continue on Pravachol 40 mg daily.  COVID-19 Education: The signs and symptoms of COVID-19 were discussed with the patient and how to seek care for testing (follow up with PCP or arrange E-visit).  The importance of social distancing was discussed today.  Patient Risk:   After full review of this patient's clinical status, I feel that they are at least moderate risk at this time.  Time:   Today, I have spent 20 minutes directly with the patient on telemedicine discussing medical problems including CAD, PAF, HTN, lipids, AS.  We also reviewed the symptoms of COVID 19 and  the ways to protect against contracting the virus with telehealth technology.  I spent an additional 5 minutes reviewing patient's chart including 2D echo and labs.  Medication Adjustments/Labs and Tests Ordered: Current medicines are reviewed at length with the patient today.  Concerns regarding medicines are outlined above.  Tests Ordered: No orders of the defined types were placed in this encounter.  Medication Changes: No orders of the defined types were placed in this encounter.   Disposition:  Follow up 1 year with me  Signed, Fransico Him, MD  06/12/2019 8:12 AM    [place]

## 2019-06-12 ENCOUNTER — Other Ambulatory Visit: Payer: Self-pay

## 2019-06-12 ENCOUNTER — Telehealth (INDEPENDENT_AMBULATORY_CARE_PROVIDER_SITE_OTHER): Payer: Medicare Other | Admitting: Cardiology

## 2019-06-12 VITALS — BP 112/57 | HR 58 | Ht 72.0 in

## 2019-06-12 DIAGNOSIS — I350 Nonrheumatic aortic (valve) stenosis: Secondary | ICD-10-CM

## 2019-06-12 DIAGNOSIS — E785 Hyperlipidemia, unspecified: Secondary | ICD-10-CM

## 2019-06-12 DIAGNOSIS — I4819 Other persistent atrial fibrillation: Secondary | ICD-10-CM | POA: Diagnosis not present

## 2019-06-12 DIAGNOSIS — I480 Paroxysmal atrial fibrillation: Secondary | ICD-10-CM

## 2019-06-12 DIAGNOSIS — I10 Essential (primary) hypertension: Secondary | ICD-10-CM

## 2019-06-12 DIAGNOSIS — I2510 Atherosclerotic heart disease of native coronary artery without angina pectoris: Secondary | ICD-10-CM

## 2019-06-12 MED ORDER — AMIODARONE HCL 100 MG PO TABS: 100.0000 mg | ORAL_TABLET | Freq: Every day | ORAL | 3 refills | Status: DC

## 2019-06-12 NOTE — Patient Instructions (Signed)
Medication Instructions:  1) DECREASE AMIODARONE to 100 mg daily  Labwork: Your provider recommends that you return for lab work (TSH, LFTs)  Testing/Procedures: Dr. Radford Pax recommends you wear a monitor for 2 weeks.  Follow-Up: Your provider wants you to follow-up in: 1 year with Dr. Radford Pax. You will receive a reminder letter in the mail two months in advance. If you don't receive a letter, please call our office to schedule the follow-up appointment.

## 2019-06-12 NOTE — Addendum Note (Signed)
Addended by: Harland German A on: 06/12/2019 08:45 AM   Modules accepted: Orders

## 2019-06-14 ENCOUNTER — Other Ambulatory Visit: Payer: Self-pay

## 2019-06-14 ENCOUNTER — Other Ambulatory Visit: Payer: Medicare Other | Admitting: *Deleted

## 2019-06-14 DIAGNOSIS — I4819 Other persistent atrial fibrillation: Secondary | ICD-10-CM

## 2019-06-14 LAB — HEPATIC FUNCTION PANEL
ALT: 19 IU/L (ref 0–44)
AST: 21 IU/L (ref 0–40)
Albumin: 4.6 g/dL (ref 3.7–4.7)
Alkaline Phosphatase: 60 IU/L (ref 39–117)
Bilirubin Total: 1.2 mg/dL (ref 0.0–1.2)
Bilirubin, Direct: 0.3 mg/dL (ref 0.00–0.40)
Total Protein: 6.8 g/dL (ref 6.0–8.5)

## 2019-06-14 LAB — TSH: TSH: 1.38 u[IU]/mL (ref 0.450–4.500)

## 2019-06-18 ENCOUNTER — Telehealth: Payer: Self-pay | Admitting: *Deleted

## 2019-06-18 NOTE — Telephone Encounter (Signed)
14 day ZIO XT long term holter monitor to be mailed to the patients home.  Instructions reviewed briefly as they are included in the monitor kit. 

## 2019-06-19 ENCOUNTER — Ambulatory Visit
Admission: RE | Admit: 2019-06-19 | Discharge: 2019-06-19 | Disposition: A | Discharge status: Home / Self Care | Payer: Medicare Other | Source: Ambulatory Visit | Attending: Neurology | Admitting: Neurology

## 2019-06-19 ENCOUNTER — Telehealth: Payer: Self-pay | Admitting: Neurology

## 2019-06-19 DIAGNOSIS — M542 Cervicalgia: Secondary | ICD-10-CM

## 2019-06-19 NOTE — Telephone Encounter (Signed)
Left vm for patient to have MRI done before his appt with Dr Leonie Man. LEft vm for patient to schedule with [facility].

## 2019-06-19 NOTE — Telephone Encounter (Signed)
Pt called wanting to inform the provider that he was not able to get the MRI done due to him wearing a Glucose Monitoring System. They wanted to take it off but the pt did not have another one to put in. Pt states it is not due to be changed until the 18th of this month. Please advise.

## 2019-06-19 NOTE — Telephone Encounter (Signed)
If patient calls back please tell him to call GI and schedule his MRI when he can take off the Glucose monitor so he can have it done.

## 2019-06-19 NOTE — Telephone Encounter (Signed)
Noted  

## 2019-06-20 NOTE — Telephone Encounter (Signed)
Noted, thank you

## 2019-06-20 NOTE — Telephone Encounter (Signed)
Pt returned call. Pt was informed to r/s at GI when he is ready to change his Glucose Monitor. Pt understood and agreed to call GI to r/s. Pt understands the the MRI will have to be done before he sees Dr. Leonie Man.

## 2019-06-21 ENCOUNTER — Ambulatory Visit (INDEPENDENT_AMBULATORY_CARE_PROVIDER_SITE_OTHER): Payer: Medicare Other

## 2019-06-21 DIAGNOSIS — I4819 Other persistent atrial fibrillation: Secondary | ICD-10-CM | POA: Diagnosis not present

## 2019-07-12 ENCOUNTER — Other Ambulatory Visit: Payer: Self-pay

## 2019-07-12 ENCOUNTER — Ambulatory Visit
Admission: RE | Admit: 2019-07-12 | Discharge: 2019-07-12 | Disposition: A | Discharge status: Home / Self Care | Payer: Medicare Other | Source: Ambulatory Visit | Attending: Neurology | Admitting: Neurology

## 2019-07-12 DIAGNOSIS — M542 Cervicalgia: Secondary | ICD-10-CM

## 2019-07-12 MED ORDER — GADOBENATE DIMEGLUMINE 529 MG/ML IV SOLN
20.0000 mL | Freq: Once | INTRAVENOUS | Status: AC | PRN
  Administered 2019-07-12: 20 mL via INTRAVENOUS

## 2019-07-15 ENCOUNTER — Telehealth: Payer: Self-pay | Admitting: Cardiology

## 2019-07-15 ENCOUNTER — Telehealth: Payer: Self-pay

## 2019-07-15 DIAGNOSIS — I4819 Other persistent atrial fibrillation: Secondary | ICD-10-CM

## 2019-07-15 NOTE — Telephone Encounter (Signed)
-----   Message from Garvin Fila, MD sent at 07/15/2019  2:07 PM EDT ----- Kindly inform the patient that MRI scan of the neck shows no significant disc problem or spinal stenosis.  Incidental soft tissue swelling is noted in the back of the neck which is a known finding

## 2019-07-15 NOTE — Telephone Encounter (Addendum)
Spoke with Riley Jordan at [facility] NP office [phone]... and she saw the Riley Jordan today and he was in afib.. BP 140/8 and HR 97... they had him increase his amiodarone to 200 mg a day and advised him to watch for hypotension.. he also had CMET and TSH today and they will fax he results when available.. she faxed the EKG to our office late this afternoon and will have it scanned in for Dr. Theodosia Blender review. Riley Jordan currently has a Zio patch looking for silent Afib.     Riley Jordan was set to see Dr. Radford Pax back 06/2020.   Last OV 06/12/19 televisit

## 2019-07-15 NOTE — Telephone Encounter (Signed)
° ° ° °  [facility] family calling to report patient is in afib, wants to discuss Amiodarone. BP today 140/80 HR 97 Office faxing EKG that was drawn today Labs from today pending  Lorriane Shire ,Please call  [phone]

## 2019-07-15 NOTE — Telephone Encounter (Signed)
Pt returning call please call back °

## 2019-07-15 NOTE — Telephone Encounter (Signed)
Please get into afib clinic

## 2019-07-15 NOTE — Telephone Encounter (Signed)
LEft vm for patient to call back about MR cervical spine and Mr brain results.

## 2019-07-16 LAB — PROTIME-INR: INR: 1.8 — AB (ref 0.9–1.1)

## 2019-07-16 NOTE — Telephone Encounter (Signed)
Pt agrees to referral to the Afib clinic.

## 2019-07-16 NOTE — Telephone Encounter (Signed)
I called patient about MRi scan of the neck. I stated it shows no significant disc problem or spinal stenosis. It shows incidental soft tissue swelling noted in the back of his neck which is a know finding. PT verbalized understanding.   I called patient about his MRi brain shows mild age appropriate changes of shrinkage of the brain and hardening of the arteries which is a not a worrisome findings. It was compared to previous MRI from 2007. Pt verbalized understanding.  ------

## 2019-07-17 ENCOUNTER — Ambulatory Visit (HOSPITAL_COMMUNITY)
Admission: RE | Admit: 2019-07-17 | Discharge: 2019-07-17 | Disposition: A | Discharge status: Home / Self Care | Payer: Medicare Other | Source: Ambulatory Visit | Attending: Nurse Practitioner | Admitting: Nurse Practitioner

## 2019-07-17 ENCOUNTER — Encounter (HOSPITAL_COMMUNITY): Payer: Self-pay | Admitting: Nurse Practitioner

## 2019-07-17 ENCOUNTER — Other Ambulatory Visit: Payer: Self-pay

## 2019-07-17 VITALS — BP 156/86 | HR 73 | Ht 72.0 in | Wt 192.8 lb

## 2019-07-17 DIAGNOSIS — I480 Paroxysmal atrial fibrillation: Secondary | ICD-10-CM | POA: Insufficient documentation

## 2019-07-17 DIAGNOSIS — I350 Nonrheumatic aortic (valve) stenosis: Secondary | ICD-10-CM | POA: Insufficient documentation

## 2019-07-17 DIAGNOSIS — M47819 Spondylosis without myelopathy or radiculopathy, site unspecified: Secondary | ICD-10-CM | POA: Insufficient documentation

## 2019-07-17 DIAGNOSIS — Z87891 Personal history of nicotine dependence: Secondary | ICD-10-CM | POA: Diagnosis not present

## 2019-07-17 DIAGNOSIS — E039 Hypothyroidism, unspecified: Secondary | ICD-10-CM | POA: Diagnosis not present

## 2019-07-17 DIAGNOSIS — Z79890 Hormone replacement therapy (postmenopausal): Secondary | ICD-10-CM | POA: Insufficient documentation

## 2019-07-17 DIAGNOSIS — I2510 Atherosclerotic heart disease of native coronary artery without angina pectoris: Secondary | ICD-10-CM | POA: Insufficient documentation

## 2019-07-17 DIAGNOSIS — Z794 Long term (current) use of insulin: Secondary | ICD-10-CM | POA: Insufficient documentation

## 2019-07-17 DIAGNOSIS — M19049 Primary osteoarthritis, unspecified hand: Secondary | ICD-10-CM | POA: Insufficient documentation

## 2019-07-17 DIAGNOSIS — Z809 Family history of malignant neoplasm, unspecified: Secondary | ICD-10-CM | POA: Insufficient documentation

## 2019-07-17 DIAGNOSIS — I4819 Other persistent atrial fibrillation: Secondary | ICD-10-CM | POA: Diagnosis not present

## 2019-07-17 DIAGNOSIS — I10 Essential (primary) hypertension: Secondary | ICD-10-CM | POA: Diagnosis not present

## 2019-07-17 DIAGNOSIS — Z79899 Other long term (current) drug therapy: Secondary | ICD-10-CM | POA: Insufficient documentation

## 2019-07-17 DIAGNOSIS — E1151 Type 2 diabetes mellitus with diabetic peripheral angiopathy without gangrene: Secondary | ICD-10-CM | POA: Diagnosis not present

## 2019-07-17 DIAGNOSIS — M4326 Fusion of spine, lumbar region: Secondary | ICD-10-CM | POA: Diagnosis not present

## 2019-07-17 DIAGNOSIS — E7800 Pure hypercholesterolemia, unspecified: Secondary | ICD-10-CM | POA: Insufficient documentation

## 2019-07-17 DIAGNOSIS — Z9049 Acquired absence of other specified parts of digestive tract: Secondary | ICD-10-CM | POA: Insufficient documentation

## 2019-07-17 DIAGNOSIS — R9431 Abnormal electrocardiogram [ECG] [EKG]: Secondary | ICD-10-CM | POA: Insufficient documentation

## 2019-07-17 DIAGNOSIS — K219 Gastro-esophageal reflux disease without esophagitis: Secondary | ICD-10-CM | POA: Diagnosis not present

## 2019-07-17 NOTE — Progress Notes (Signed)
Primary Care Physician: Leonard Downing, MD Referring Physician:Dr. Prahlad Freytes is a 83 y.o. male with a h/o CAD, paroxysmal afib, aortic stenosis, DM, that was seen 9/9 for f/u with Dr. Radford Pax. He c/o of fatigue and noticing HR's in the 40-50's. Amiodarone was reduced to 100 mg daily and 2 week ZIO patch was placed. It was turned in last week and results are not back. He saw his PCP on  Monday, after seeing levated HR's on Sunday in the 110's. He was in afib and amiodarone was increased back to 200 mg a day. Now his HR's are back in the 60/70's at home and pt thought he was back in rhythm as he has felt well. EKG confirms afib with CVR at 75 bpm.   Today, he denies symptoms of palpitations, chest pain, shortness of breath, orthopnea, PND, lower extremity edema, dizziness, presyncope, syncope, or neurologic sequela. The patient is tolerating medications without difficulties and is otherwise without complaint today.   Past Medical History:  Diagnosis Date  . Aortic stenosis 02/16/2016   mild by echo 2018  . Arthritis    "back; hands" (07/16/2012)  . CAD (coronary atherosclerotic disease)    s/ PCI left circ/OM 09/1999, cutting balloon PCI mid LAD 08/2001, , chronically untreated tortuous lateral marginal branch, DES to prox to mid LAD and DES to mid circ 10/13,  patent stents in the prox/mid LAD and mid LCx/OM1 with mild diffuse restenosis of the LAD/Diag, mild disease in the RCA with mild LV dysfunction with EF 45-50%  . Depression    "wife died 2012-01-31" (July 16, 2012)  . Diabetic peripheral angiopathy ([place])   . GERD (gastroesophageal reflux disease)   . High cholesterol    intolerant to Lipitor/Crestor  . History of blood transfusion 1990's   S/P back OR  . Hypertension   . Hypothyroidism   . Persistent atrial fibrillation   . Type II diabetes mellitus ([place])   . Wears glasses    Past Surgical History:  Procedure Laterality Date  . BACK SURGERY    . BACK SURGERY     . CARDIAC CATHETERIZATION N/A 09/16/2015   Procedure: Left Heart Cath and Coronary Angiography;  Surgeon: Burnell Blanks, MD;  Location: [facility];  Service: Cardiovascular;  Laterality: N/A;  . CHOLECYSTECTOMY  2012  . CORONARY ANGIOPLASTY WITH STENT PLACEMENT  2000; 07/16/12   "3 + 2; total of 5" (07-16-2012)  . [place] SURGERY  1997  . PERCUTANEOUS CORONARY STENT INTERVENTION (PCI-S) N/A Jul 16, 2012   Procedure: PERCUTANEOUS CORONARY STENT INTERVENTION (PCI-S);  Surgeon: Jettie Booze, MD;  Location: [facility] CATH LAB;  Service: Cardiovascular;  Laterality: N/A;  . POSTERIOR FUSION LUMBAR SPINE  ~ 1998  . TYMPANOSTOMY TUBE PLACEMENT      Current Outpatient Medications  Medication Sig Dispense Refill  . acetaminophen (TYLENOL) 500 MG tablet Take 500-1,000 mg by mouth 2 (two) times daily. Take 1 tablet (500 mg) by mouth every morning and 2 tablets (1000 mg) at bedtime    . amiodarone (PACERONE) 100 MG tablet Take 1 tablet (100 mg total) by mouth daily. (Patient taking differently: Take 200 mg by mouth daily. ) 90 tablet 3  . cimetidine (TAGAMET) 200 MG tablet Take 200 mg by mouth 2 (two) times daily. Takes 1-2 tablets daily    . fluticasone (FLONASE) 50 MCG/ACT nasal spray 1 spray daily.     . hydrochlorothiazide (HYDRODIURIL) 50 MG tablet Take 50 mg by mouth daily.  0  . insulin regular (NOVOLIN R) 100 units/mL injection Inject 3 to 8 units into skin with meals per sliding scale    . isosorbide mononitrate (IMDUR) 60 MG 24 hr tablet TAKE 1 AND 1/2 TABLETS BY MOUTH AT BEDTIME 135 tablet 1  . LANTUS SOLOSTAR 100 UNIT/ML Solostar Pen Inject 42 Units into the skin at bedtime.    Marland Kitchen levothyroxine (SYNTHROID, LEVOTHROID) 175 MCG tablet Take 175 mcg by mouth daily before breakfast.     . meclizine (ANTIVERT) 25 MG tablet Take 25 mg by mouth 3 (three) times daily.     . metFORMIN (GLUCOPHAGE) 1000 MG tablet Take 1,000 mg by mouth 2 (two) times daily.    . nitroGLYCERIN  (NITROSTAT) 0.4 MG SL tablet Place 0.4 mg under the tongue every 5 (five) minutes as needed for chest pain.    . pravastatin (PRAVACHOL) 40 MG tablet TAKE 1 TABLET BY MOUTH DAILY IN THE EVENING 90 tablet 1  . terazosin (HYTRIN) 10 MG capsule Taking two tablets daily    . warfarin (COUMADIN) 5 MG tablet Take 2.5-5 mg by mouth daily with supper. Take 1 tablet (5 mg) by mouth 1st day, then take 1/2 tablet (2.5 mg) on 2nd and 3rd day, then repeat    . amLODipine (NORVASC) 2.5 MG tablet Take 1 tablet (2.5 mg total) by mouth daily. (Patient not taking: Reported on 07/17/2019) 90 tablet 3   No current facility-administered medications for this encounter.    Facility-Administered Medications Ordered in Other Encounters  Medication Dose Route Frequency Provider Last Rate Last Dose  . aminophylline injection 75 mg  75 mg Intravenous BID PRN Dorothy Spark, MD   75 mg at 08/31/15 1005    No Known Allergies  Social History   Socioeconomic History  . Marital status: Widowed    Spouse name: Not on file  . Number of children: Not on file  . Years of education: Not on file  . Highest education level: Not on file  Occupational History  . Not on file  Social Needs  . Financial resource strain: Not on file  . Food insecurity    Worry: Not on file    Inability: Not on file  . Transportation needs    Medical: Not on file    Non-medical: Not on file  Tobacco Use  . Smoking status: Former Smoker    Packs/day: 1.00    Years: 40.00    Pack years: 40.00    Types: Cigarettes    Quit date: 09/26/1989    Years since quitting: 29.8  . Smokeless tobacco: Never Used  Substance and Sexual Activity  . Alcohol use: Yes    Comment: 07/12/2012 "daquiri maybe 3X/yr"  . Drug use: No  . Sexual activity: Never  Lifestyle  . Physical activity    Days per week: Not on file    Minutes per session: Not on file  . Stress: Not on file  Relationships  . Social Herbalist on phone: Not on file     Gets together: Not on file    Attends religious service: Not on file    Active member of club or organization: Not on file    Attends meetings of clubs or organizations: Not on file    Relationship status: Not on file  . Intimate partner violence    Fear of current or ex partner: Not on file    Emotionally abused: Not on file    Physically abused:  Not on file    Forced sexual activity: Not on file  Other Topics Concern  . Not on file  Social History Narrative  . Not on file    Family History  Problem Relation Age of Onset  . Cancer Mother   . Cancer Father     ROS- All systems are reviewed and negative except as per the HPI above  Physical Exam: Vitals:   07/17/19 1039  BP: (!) 156/86  Pulse: 73  Weight: 87.5 kg  Height: 6' (1.829 m)   Wt Readings from Last 3 Encounters:  07/17/19 87.5 kg  05/16/19 85.9 kg  11/30/18 86.6 kg    Labs: Lab Results  Component Value Date   NA 136 12/03/2018   K 4.2 12/03/2018   CL 98 12/03/2018   CO2 23 12/03/2018   GLUCOSE 283 (H) 12/03/2018   BUN 13 12/03/2018   CREATININE 0.96 12/03/2018   CALCIUM 9.3 12/03/2018   MG 2.0 03/13/2011   Lab Results  Component Value Date   INR 1.17 09/14/2015   Lab Results  Component Value Date   CHOL 109 12/03/2018   HDL 41 12/03/2018   LDLCALC 51 12/03/2018   TRIG 85 12/03/2018     GEN- The patient is well appearing, alert and oriented x 3 today.   Head- normocephalic, atraumatic Eyes-  Sclera clear, conjunctiva pink Ears- hearing intact Oropharynx- clear Neck- supple, no JVP Lymph- no cervical lymphadenopathy Lungs- Clear to ausculation bilaterally, normal work of breathing Heart- Irregular rate and rhythm, no murmurs, rubs or gallops, PMI not laterally displaced GI- soft, NT, ND, + BS Extremities- no clubbing, cyanosis, or edema MS- no significant deformity or atrophy Skin- no rash or lesion Psych- euthymic mood, full affect Neuro- strength and sensation are intact   EKG-afib at 73 bpm, qrs int 98 ms, qtc 480 ms Epic results reviewed   Assessment and Plan: 1. Afib Has been back in afib x  4 days He is rate controlled and is unaware He just increased amiodarone to 200 mg daily on Monday, continue this dose in hope that it will convert him I will not do a higher reload dose as he has seen HR's in the  60's at home   I will bring back in 10 days to reassess rhythm Hopefully zio patch will be back at that point to review  He had an INR drawn last Monday at PCP and sees his PCP again this Monday I will ask for INR's to be done there until we have 4 weekly therapeutic results in case cardioversion is needed  Butch Penny C. Miaisabella Bacorn, [facility] [address] [place], [place] [address] [phone]

## 2019-07-17 NOTE — Patient Instructions (Signed)
Continue Amiodarone 200mg  a day  Continue weekly INR checks for now.

## 2019-07-24 ENCOUNTER — Encounter: Payer: Self-pay | Admitting: Neurology

## 2019-07-24 ENCOUNTER — Ambulatory Visit (INDEPENDENT_AMBULATORY_CARE_PROVIDER_SITE_OTHER): Payer: Medicare Other | Admitting: Neurology

## 2019-07-24 ENCOUNTER — Other Ambulatory Visit: Payer: Self-pay

## 2019-07-24 VITALS — BP 148/78 | HR 57 | Temp 97.7°F | Wt 190.4 lb

## 2019-07-24 DIAGNOSIS — G44209 Tension-type headache, unspecified, not intractable: Secondary | ICD-10-CM | POA: Diagnosis not present

## 2019-07-24 DIAGNOSIS — I2510 Atherosclerotic heart disease of native coronary artery without angina pectoris: Secondary | ICD-10-CM | POA: Diagnosis not present

## 2019-07-24 NOTE — Patient Instructions (Signed)
I had a long discussion with the patient regarding his  headaches and posterior neck pain which sound like muscle tension headaches which appear lot better now.[facility] MRI findings with him and answered questions.  I  recommend he do regular neck stretching exercises.  I advised him to limit using tylenol to not more than 2 or 3 days a week to avoid analgesic rebound.he will return for f/u in future only as necessary and no scheduled appointment is necessary

## 2019-07-24 NOTE — Progress Notes (Signed)
[facility] [address] [place]. [place] [address] [phone]       OFFICE FOLLOW UPVISIT NOTE  Mr. Riley Jordan Date of Birth:  August 15, 1936 Medical Record Number:  [id number]   Referring MD:  Melida Quitter Reason for Referral: Headache  HPI: Initial Consult 05/16/2019 :Mr. Riley Jordan is a 83 year old Caucasian male seen today for initial office consultation visit for headaches.  History is obtained from the patient and review of referral notes and electronic medical records.  I personally reviewed imaging films in PACS.  He states that he started having headaches since June of this year.  He describes them as bifrontal pressure-like constant headaches.  These appear to be more prominent in the morning when he arises.  They are not as severe and he reports them as 2 or 3/10 in severity.  There may last for 4 to 5 hours.  It is pressure-like in nature.  There is no accompanying nausea, vomiting, light or sound sensitivity.  He is able to continue to work despite the headaches.  He had an episode of sinusitis in January and did have some mild headaches at that time and after treatment with antibiotics they have resolved.  So when he first saw his primary physician in June for these headaches he was given another course of antibiotics which did not help.  He saw ENT physician Dr. Redmond Baseman who read the CT scan of his paranasal sinuses on 03/28/2019 which showed a small polyp which was not felt to be responsible for his symptoms.  Patient denies any recent back injury, surgery or spinal tap.  He does complain of tightness in his posterior neck muscles and back of his head.  He in fact has restriction of neck movements on either side.  He may have some arthritis in his spine.  He denies any radicular pain gait or balance problems.  There is no prior history of migraines, TIA, strokes, seizures, close head injury or loss of consciousness.  He does have a longstanding sebaceous cyst on the skin of  the back of his neck which he feels is unchanged and not tender or bothersome.  He has not had any brain imaging study done.  He has not been taking any medications on a regular basis for these headaches. Update 07/24/2019 : He returns for follow-up after last visit with me 2 months ago.  He states his headaches are all whole lot better.  He barely gets minor headaches once or twice a week and takes Tylenol which works quite well.  He has been doing regular neck stretching exercises which seems to be helping.  The patient was started by me on Depakote but he took it daily for a week and discontinued as it made him feel loopy and tired as well as forgetful.  He did undergo MRI scan of the brain on 07/12/2019 which I personally reviewed shows mild changes of chronic microvascular ischemia and generalized cerebral atrophy.  These appear progressed expectedly compared with previous MRI from 2007.  MRI scan cervical spine shows no significant compression but shows incidental cyst subcutaneous sebaceous cyst at C3-4 which is chronic.  Patient states he is doing well and has no new complaints. ROS:   14 system review of systems is positive for headache, neck pain, restriction of neck movements, decreased hearing, skin swelling and all other systems negative  PMH:  Past Medical History:  Diagnosis Date   Aortic stenosis 02/16/2016   mild by echo 2018  Arthritis    "back; hands" (August 02, 2012)   CAD (coronary atherosclerotic disease)    s/ PCI left circ/OM 09/1999, cutting balloon PCI mid LAD 08/2001, , chronically untreated tortuous lateral marginal branch, DES to prox to mid LAD and DES to mid circ 10/13,  patent stents in the prox/mid LAD and mid LCx/OM1 with mild diffuse restenosis of the LAD/Diag, mild disease in the RCA with mild LV dysfunction with EF 45-50%   Depression    "wife died 02-17-2012" (08-02-12)   Diabetic peripheral angiopathy ([facility])    GERD (gastroesophageal reflux disease)    High  cholesterol    intolerant to Lipitor/Crestor   History of blood transfusion 1990's   S/P back OR   Hypertension    Hypothyroidism    Persistent atrial fibrillation ([facility])    Type II diabetes mellitus ([place])    Wears glasses     Social History:  Social History   Socioeconomic History   Marital status: Widowed    Spouse name: Not on file   Number of children: Not on file   Years of education: Not on file   Highest education level: Not on file  Occupational History   Not on file  Social Needs   Financial resource strain: Not on file   Food insecurity    Worry: Not on file    Inability: Not on file   Transportation needs    Medical: Not on file    Non-medical: Not on file  Tobacco Use   Smoking status: Former Smoker    Packs/day: 1.00    Years: 40.00    Pack years: 40.00    Types: Cigarettes    Quit date: 09/26/1989    Years since quitting: 29.8   Smokeless tobacco: Never Used  Substance and Sexual Activity   Alcohol use: Yes    Comment: 2012/08/02 "daquiri maybe 3X/yr"   Drug use: No   Sexual activity: Never  Lifestyle   Physical activity    Days per week: Not on file    Minutes per session: Not on file   Stress: Not on file  Relationships   Social connections    Talks on phone: Not on file    Gets together: Not on file    Attends religious service: Not on file    Active member of club or organization: Not on file    Attends meetings of clubs or organizations: Not on file    Relationship status: Not on file   Intimate partner violence    Fear of current or ex partner: Not on file    Emotionally abused: Not on file    Physically abused: Not on file    Forced sexual activity: Not on file  Other Topics Concern   Not on file  Social History Narrative   Not on file    Medications:   Current Outpatient Medications on File Prior to Visit  Medication Sig Dispense Refill   acetaminophen (TYLENOL) 500 MG tablet Take 500-1,000 mg by  mouth 2 (two) times daily. Take 1 tablet (500 mg) by mouth every morning and 2 tablets (1000 mg) at bedtime     amiodarone (PACERONE) 100 MG tablet Take 1 tablet (100 mg total) by mouth daily. (Patient taking differently: Take 200 mg by mouth daily. ) 90 tablet 3   amLODipine (NORVASC) 2.5 MG tablet Take 1 tablet (2.5 mg total) by mouth daily. 90 tablet 3   cimetidine (TAGAMET) 200 MG tablet Take 200 mg by mouth 2 (  two) times daily. Takes 1-2 tablets daily     fluticasone (FLONASE) 50 MCG/ACT nasal spray 1 spray daily.      hydrochlorothiazide (HYDRODIURIL) 50 MG tablet Take 50 mg by mouth daily.   0   insulin regular (NOVOLIN R) 100 units/mL injection Inject 3 to 8 units into skin with meals per sliding scale     isosorbide mononitrate (IMDUR) 60 MG 24 hr tablet TAKE 1 AND 1/2 TABLETS BY MOUTH AT BEDTIME 135 tablet 1   LANTUS SOLOSTAR 100 UNIT/ML Solostar Pen Inject 42 Units into the skin at bedtime.     levothyroxine (SYNTHROID, LEVOTHROID) 175 MCG tablet Take 175 mcg by mouth daily before breakfast.      meclizine (ANTIVERT) 25 MG tablet Take 25 mg by mouth 3 (three) times daily.      metFORMIN (GLUCOPHAGE) 1000 MG tablet Take 1,000 mg by mouth 2 (two) times daily.     nitroGLYCERIN (NITROSTAT) 0.4 MG SL tablet Place 0.4 mg under the tongue every 5 (five) minutes as needed for chest pain.     pravastatin (PRAVACHOL) 40 MG tablet TAKE 1 TABLET BY MOUTH DAILY IN THE EVENING 90 tablet 1   terazosin (HYTRIN) 10 MG capsule Taking two tablets daily     warfarin (COUMADIN) 5 MG tablet Take 2.5-5 mg by mouth daily with supper. Take 1 tablet (5 mg) by mouth 1st day, then take 1/2 tablet (2.5 mg) on 2nd and 3rd day, then repeat     Current Facility-Administered Medications on File Prior to Visit  Medication Dose Route Frequency Provider Last Rate Last Dose   aminophylline injection 75 mg  75 mg Intravenous BID PRN Dorothy Spark, MD   75 mg at 08/31/15 1005    Allergies:  No Known  Allergies  Physical Exam General: Frail elderly Caucasian male., seated, in no evident distress Head: head normocephalic and atraumatic.   Neck: supple with no carotid or supraclavicular bruits Cardiovascular: regular rate and rhythm, no murmurs Musculoskeletal: no deformity Skin:  no rash/petichiae large 2 x 2 centimeter sebaceous cyst in the posterior neck in the midline at around C5-C6 Vascular:  Normal pulses all extremities  Neurologic Exam Mental Status: Awake and fully alert. Oriented to place and time. Recent and remote memory intact. Attention span, concentration and fund of knowledge appropriate. Mood and affect appropriate.  Cranial Nerves: Fundoscopic exam reveals sharp disc margins. Pupils equal, briskly reactive to light. Extraocular movements full without nystagmus. Visual fields full to confrontation. Hearing diminished significantly in the left ear.. Facial sensation intact. Face, tongue, palate moves normally and symmetrically.  Motor: Normal bulk and tone. Normal strength in all tested extremity muscles. Sensory.: intact to touch , pinprick , position and vibratory sensation.  Coordination: Rapid alternating movements normal in all extremities. Finger-to-nose and heel-to-shin performed accurately bilaterally. Gait and Station: Arises from chair without difficulty. Stance is normal. Gait demonstrates normal stride length and balance . Able to heel, toe and tandem walk with mild  difficulty.  Reflexes: 1+ and symmetric. Toes downgoing.      ASSESSMENT: 83 year old Caucasian male with new onset daily headaches likely muscle tension headaches as well as neck pain which appears musculoskeletal in nature.     PLAN: I had a long discussion with the patient regarding his  headaches and posterior neck pain which sound like muscle tension headaches which appear lot better now.[facility] MRI findings with him and answered questions.  I  recommend he do regular neck stretching  exercises.  I  advised him to limit using tylenol to not more than 2 or 3 days a week to avoid analgesic rebound.he will return for f/u in future only as necessary and no scheduled appointment is necessary Greater than 50% time during this 25-minute visit was spent on counseling and coordination of care about his headaches and answering questions  Antony Contras, MD  [facility] [address] [place], [place] [address]  Phone [phone] Fax [phone] Note: This document was prepared with digital dictation and possible smart phrase technology. Any transcriptional errors that result from this process are unintentional.

## 2019-07-29 ENCOUNTER — Ambulatory Visit (HOSPITAL_COMMUNITY)
Admission: RE | Admit: 2019-07-29 | Discharge: 2019-07-29 | Disposition: A | Discharge status: Home / Self Care | Payer: Medicare Other | Source: Ambulatory Visit | Attending: Nurse Practitioner | Admitting: Nurse Practitioner

## 2019-07-29 ENCOUNTER — Encounter (HOSPITAL_COMMUNITY): Payer: Self-pay | Admitting: Nurse Practitioner

## 2019-07-29 ENCOUNTER — Other Ambulatory Visit: Payer: Self-pay

## 2019-07-29 VITALS — BP 140/80 | HR 94 | Ht 72.0 in | Wt 190.2 lb

## 2019-07-29 DIAGNOSIS — Z87891 Personal history of nicotine dependence: Secondary | ICD-10-CM | POA: Insufficient documentation

## 2019-07-29 DIAGNOSIS — I350 Nonrheumatic aortic (valve) stenosis: Secondary | ICD-10-CM | POA: Diagnosis not present

## 2019-07-29 DIAGNOSIS — I4819 Other persistent atrial fibrillation: Secondary | ICD-10-CM

## 2019-07-29 DIAGNOSIS — I2510 Atherosclerotic heart disease of native coronary artery without angina pectoris: Secondary | ICD-10-CM | POA: Insufficient documentation

## 2019-07-29 DIAGNOSIS — Z7901 Long term (current) use of anticoagulants: Secondary | ICD-10-CM | POA: Insufficient documentation

## 2019-07-29 DIAGNOSIS — E118 Type 2 diabetes mellitus with unspecified complications: Secondary | ICD-10-CM | POA: Insufficient documentation

## 2019-07-29 DIAGNOSIS — I480 Paroxysmal atrial fibrillation: Secondary | ICD-10-CM | POA: Insufficient documentation

## 2019-07-29 DIAGNOSIS — Z79899 Other long term (current) drug therapy: Secondary | ICD-10-CM | POA: Diagnosis not present

## 2019-07-29 DIAGNOSIS — Z794 Long term (current) use of insulin: Secondary | ICD-10-CM | POA: Insufficient documentation

## 2019-07-29 NOTE — Progress Notes (Signed)
Primary Care Physician: Leonard Downing, MD Referring Physician:Dr. Srihith Nix is a 83 y.o. male with a h/o CAD, paroxysmal afib, aortic stenosis, DM, that was seen 9/9 for f/u with Dr. Radford Pax. He c/o of fatigue and noticing HR's in the 40-50's. Amiodarone was reduced to 100 mg daily and 2 week ZIO patch was placed. It was turned in last week and results are not back. He saw his PCP on  Monday, after seeing levated HR's on Sunday in the 110's. He was in afib and amiodarone was increased back to 200 mg a day. Now his HR's are back in the 60/70's at home and pt thought he was back in rhythm as he has felt well. EKG confirms afib with CVR at 75 bpm.   F/u in afib clinic, 10/26. The Holter monitor that pt wore 9/18 through 10/2 report was reviewed as interpreted by Dr. Radford Pax and showed 15% afib burden. He is in the clinic today with atrial flutter with rate controlled. He is still unsure if he is paroxysmal or persistent . He continues on 200 mg amiodarone a day.He states some days he feels better with HR's in the  60's other days he is more bothered with exertional dyspnea and fatigue. He has been getting weekly INR's to prepare for a DCCV if needed. We have not been receiving these from  his PCP but will request again. He was sub therapeutic at 1.8 2 weeks ago.    Today, he denies symptoms of palpitations, chest pain, shortness of breath, orthopnea, PND, lower extremity edema, dizziness, presyncope, syncope, or neurologic sequela. The patient is tolerating medications without difficulties and is otherwise without complaint today.   Past Medical History:  Diagnosis Date  . Aortic stenosis 02/16/2016   mild by echo 2018  . Arthritis    "back; hands" (08/10/12)  . CAD (coronary atherosclerotic disease)    s/ PCI left circ/OM 09/1999, cutting balloon PCI mid LAD 08/2001, , chronically untreated tortuous lateral marginal branch, DES to prox to mid LAD and DES to mid circ 10/13,   patent stents in the prox/mid LAD and mid LCx/OM1 with mild diffuse restenosis of the LAD/Diag, mild disease in the RCA with mild LV dysfunction with EF 45-50%  . Depression    "wife died 2012-02-25" (2012-08-10)  . Diabetic peripheral angiopathy ([place])   . GERD (gastroesophageal reflux disease)   . High cholesterol    intolerant to Lipitor/Crestor  . History of blood transfusion 1990's   S/P back OR  . Hypertension   . Hypothyroidism   . Persistent atrial fibrillation ([place])   . Type II diabetes mellitus ([place])   . Wears glasses    Past Surgical History:  Procedure Laterality Date  . BACK SURGERY    . BACK SURGERY    . CARDIAC CATHETERIZATION N/A 09/16/2015   Procedure: Left Heart Cath and Coronary Angiography;  Surgeon: Burnell Blanks, MD;  Location: [facility];  Service: Cardiovascular;  Laterality: N/A;  . CHOLECYSTECTOMY  2012  . CORONARY ANGIOPLASTY WITH STENT PLACEMENT  2000; Aug 10, 2012   "3 + 2; total of 5" (08/10/12)  . [place] SURGERY  1997  . PERCUTANEOUS CORONARY STENT INTERVENTION (PCI-S) N/A 08/10/2012   Procedure: PERCUTANEOUS CORONARY STENT INTERVENTION (PCI-S);  Surgeon: Jettie Booze, MD;  Location: [facility] CATH LAB;  Service: Cardiovascular;  Laterality: N/A;  . POSTERIOR FUSION LUMBAR SPINE  ~ 1998  . TYMPANOSTOMY TUBE PLACEMENT  Current Outpatient Medications  Medication Sig Dispense Refill  . acetaminophen (TYLENOL) 500 MG tablet Take 500-1,000 mg by mouth 2 (two) times daily. Take 1 tablet (500 mg) by mouth every morning and 2 tablets (1000 mg) at bedtime    . amiodarone (PACERONE) 100 MG tablet Take 1 tablet (100 mg total) by mouth daily. (Patient taking differently: Take 200 mg by mouth daily. ) 90 tablet 3  . amLODipine (NORVASC) 2.5 MG tablet Take 1 tablet (2.5 mg total) by mouth daily. 90 tablet 3  . cimetidine (TAGAMET) 200 MG tablet Take 200 mg by mouth 2 (two) times daily. Takes 1-2 tablets daily    . fluticasone (FLONASE) 50  MCG/ACT nasal spray 1 spray daily.     . hydrochlorothiazide (HYDRODIURIL) 50 MG tablet Take 50 mg by mouth daily.   0  . insulin regular (NOVOLIN R) 100 units/mL injection Inject 3 to 8 units into skin with meals per sliding scale    . isosorbide mononitrate (IMDUR) 60 MG 24 hr tablet TAKE 1 AND 1/2 TABLETS BY MOUTH AT BEDTIME 135 tablet 1  . LANTUS SOLOSTAR 100 UNIT/ML Solostar Pen Inject 42 Units into the skin at bedtime.    Marland Kitchen levothyroxine (SYNTHROID, LEVOTHROID) 175 MCG tablet Take 175 mcg by mouth daily before breakfast.     . meclizine (ANTIVERT) 25 MG tablet Take 25 mg by mouth 3 (three) times daily.     . metFORMIN (GLUCOPHAGE) 1000 MG tablet Take 1,000 mg by mouth 2 (two) times daily.    . nitroGLYCERIN (NITROSTAT) 0.4 MG SL tablet Place 0.4 mg under the tongue every 5 (five) minutes as needed for chest pain.    . pravastatin (PRAVACHOL) 40 MG tablet TAKE 1 TABLET BY MOUTH DAILY IN THE EVENING 90 tablet 1  . terazosin (HYTRIN) 10 MG capsule Taking two tablets daily    . warfarin (COUMADIN) 5 MG tablet Take 2.5-5 mg by mouth daily with supper. Take 1 tablet (5 mg) by mouth Monday and tuesday, then take 1/2 tablet Wednesday, 5mg  on Thursday, Friday 1/2 tablet on Saturday and 5mg  tablet on Sunday     No current facility-administered medications for this encounter.    Facility-Administered Medications Ordered in Other Encounters  Medication Dose Route Frequency Provider Last Rate Last Dose  . aminophylline injection 75 mg  75 mg Intravenous BID PRN Dorothy Spark, MD   75 mg at 08/31/15 1005    No Known Allergies  Social History   Socioeconomic History  . Marital status: Widowed    Spouse name: Not on file  . Number of children: Not on file  . Years of education: Not on file  . Highest education level: Not on file  Occupational History  . Not on file  Social Needs  . Financial resource strain: Not on file  . Food insecurity    Worry: Not on file    Inability: Not on file   . Transportation needs    Medical: Not on file    Non-medical: Not on file  Tobacco Use  . Smoking status: Former Smoker    Packs/day: 1.00    Years: 40.00    Pack years: 40.00    Types: Cigarettes    Quit date: 09/26/1989    Years since quitting: 29.8  . Smokeless tobacco: Never Used  Substance and Sexual Activity  . Alcohol use: Yes    Comment: 07/12/2012 "daquiri maybe 3X/yr"  . Drug use: No  . Sexual activity: Never  Lifestyle  .  Physical activity    Days per week: Not on file    Minutes per session: Not on file  . Stress: Not on file  Relationships  . Social Herbalist on phone: Not on file    Gets together: Not on file    Attends religious service: Not on file    Active member of club or organization: Not on file    Attends meetings of clubs or organizations: Not on file    Relationship status: Not on file  . Intimate partner violence    Fear of current or ex partner: Not on file    Emotionally abused: Not on file    Physically abused: Not on file    Forced sexual activity: Not on file  Other Topics Concern  . Not on file  Social History Narrative  . Not on file    Family History  Problem Relation Age of Onset  . Cancer Mother   . Cancer Father     ROS- All systems are reviewed and negative except as per the HPI above  Physical Exam: Vitals:   07/29/19 1113  BP: 140/80  Pulse: 94  Weight: 86.3 kg  Height: 6' (1.829 m)   Wt Readings from Last 3 Encounters:  07/29/19 86.3 kg  07/24/19 86.4 kg  07/17/19 87.5 kg    Labs: Lab Results  Component Value Date   NA 136 12/03/2018   K 4.2 12/03/2018   CL 98 12/03/2018   CO2 23 12/03/2018   GLUCOSE 283 (H) 12/03/2018   BUN 13 12/03/2018   CREATININE 0.96 12/03/2018   CALCIUM 9.3 12/03/2018   MG 2.0 03/13/2011   Lab Results  Component Value Date   INR 1.8 (A) 07/15/2019   Lab Results  Component Value Date   CHOL 109 12/03/2018   HDL 41 12/03/2018   LDLCALC 51 12/03/2018   TRIG  85 12/03/2018     GEN- The patient is well appearing, alert and oriented x 3 today.   Head- normocephalic, atraumatic Eyes-  Sclera clear, conjunctiva pink Ears- hearing intact Oropharynx- clear Neck- supple, no JVP Lymph- no cervical lymphadenopathy Lungs- Clear to ausculation bilaterally, normal work of breathing Heart- Irregular rate and rhythm, no murmurs, rubs or gallops, PMI not laterally displaced GI- soft, NT, ND, + BS Extremities- no clubbing, cyanosis, or edema MS- no significant deformity or atrophy Skin- no rash or lesion Psych- euthymic mood, full affect Neuro- strength and sensation are intact  EKG-afib at 73 bpm, qrs int 98 ms, qtc 480 ms Epic results reviewed Zio patch-Study Highlights   Sinus bradycardia, NSR and sinus tachycardia. The average heart rate in was 68bpm. The heart rate ranged from 48 to 126bpm.  Atrial fibrillation/flutter with RVR up to 169bpm. Afib/flutter burden 16%.  Rare PVC and PACs       Assessment and Plan: 1. Afib Unsure if persistent or paroxysmal  Will place a 5 day monitor If still paroxysmal, I will send  to EP to be considered for ablation  If persistent will set up for DCCV after 4 weeks of therapeutic INR's Message to Dr. Tretha Sciara office today to share INR's with [place] Continue  Amiodarone at 200 mg daily  Continue  warfarin  I will not do a higher reload dose as he has seen HR's in the  60's at home     I will ask for INR's to be done at PCP weekly  until we have 4 weekly therapeutic results in  case cardioversion is needed  Geroge Baseman. [place], [facility] [address] [place], [place] [address] [phone]

## 2019-07-29 NOTE — Patient Instructions (Signed)
Please have INRs faxed over to [phone]

## 2019-08-23 ENCOUNTER — Telehealth (HOSPITAL_COMMUNITY): Payer: Self-pay | Admitting: *Deleted

## 2019-08-23 NOTE — Telephone Encounter (Signed)
Patient doesn't feel his burden warrants a change in therapy at this time. If burden should increase he would be more inclined to discuss ablation at that time. Pt will call if issues arise before next scheduled visit.

## 2019-08-23 NOTE — Telephone Encounter (Signed)
-----   Message from Sherran Needs, NP sent at 08/23/2019 11:37 AM EST ----- Please let pt know that he is in SR the majority of the time. 32% atrial flutter, some short runs of svt( 5-8 beats)  Is he symptomatic? If not I would stay with amiodarone daily, no change in approach. If he is, could refer to see if he would be an ablation candidate if  pt would like to purse. No need for weekly INR's since he will not require cardioversion.

## 2019-08-27 NOTE — Telephone Encounter (Signed)
Pt called back in stating he had 2 episodes over the weekend so now would like to discuss ablation  - appt requested.

## 2019-09-04 ENCOUNTER — Telehealth (INDEPENDENT_AMBULATORY_CARE_PROVIDER_SITE_OTHER): Payer: Medicare Other | Admitting: Internal Medicine

## 2019-09-04 ENCOUNTER — Encounter: Payer: Self-pay | Admitting: Internal Medicine

## 2019-09-04 ENCOUNTER — Other Ambulatory Visit: Payer: Self-pay

## 2019-09-04 ENCOUNTER — Telehealth: Payer: Self-pay

## 2019-09-04 VITALS — BP 108/71 | HR 97 | Ht 72.0 in | Wt 192.0 lb

## 2019-09-04 DIAGNOSIS — I350 Nonrheumatic aortic (valve) stenosis: Secondary | ICD-10-CM | POA: Diagnosis not present

## 2019-09-04 DIAGNOSIS — I480 Paroxysmal atrial fibrillation: Secondary | ICD-10-CM

## 2019-09-04 DIAGNOSIS — I2510 Atherosclerotic heart disease of native coronary artery without angina pectoris: Secondary | ICD-10-CM

## 2019-09-04 DIAGNOSIS — I10 Essential (primary) hypertension: Secondary | ICD-10-CM | POA: Diagnosis not present

## 2019-09-04 NOTE — Telephone Encounter (Signed)
Call placed to Pt.  Pt will contact his insurance company with afib CPT code to see what his OOP will be for procedure.  He will then call [facility] and speak with billing to discuss OOP costs.  Pt states he cannot use computer to go to website regarding OOP costs.  Sent mychart signup to Pt's daughter per Pt request for ease of communication (Pt states very difficult to get through on phone.  Await further needs

## 2019-09-04 NOTE — Telephone Encounter (Signed)
-----   Message from Thompson Grayer, MD sent at 09/04/2019  9:50 AM EST ----- Afib ablation Carto/ICE/Anesthesia  Cardiac CT   Stop coumadin and start on xarelto 20mg  qpm for 3 weeks prior to ablation  He needs to check on insurance as he has a 20 percent copay!  Maybe help him look into this.

## 2019-09-04 NOTE — Telephone Encounter (Signed)
Follow up   Pt is returning call    

## 2019-09-04 NOTE — Telephone Encounter (Signed)
LMTCB

## 2019-09-04 NOTE — Progress Notes (Signed)
Electrophysiology TeleHealth Note   Due to national recommendations of social distancing due to [place] 19, an audio telehealth visit is felt to be most appropriate for this patient at this time.  Verbal consent was obtained by me for the telehealth visit today.  The patient does not have capability for a virtual visit.  A phone visit is therefore required today.   Date:  09/04/2019   ID:  Riley Jordan, DOB 1936-05-03, MRN [id number]  Location: patient's home  Provider location:  [facility]  Evaluation Performed: new consult  PCP:  Leonard Downing, MD   Electrophysiologist:  Dr Rayann Heman  Reason for Consult: AF  Referring MD: AF clinic  History of Present Illness:    Riley Jordan is a 83 y.o. male who presents via telehealth conferencing today. He is referred from the AF clinic for evaluation of treatment options for AF.  He has failed medical therapy with amiodarone.  He is symptomatic with his AF with fatigue, weakness and palpitations. He has been on Coumadin for anticoagulation. Echocardiogram 04/2019 demonstrated normal LVEF, LA 32. Today, he denies symptoms of palpitations, chest pain, shortness of breath,  lower extremity edema, dizziness, presyncope, or syncope.  The patient is otherwise without complaint today.  The patient denies symptoms of fevers, chills, cough, or new SOB worrisome for COVID 19.  Past Medical History:  Diagnosis Date  . Aortic stenosis 02/16/2016   mild by echo 2018  . Arthritis    "back; hands" (07-23-2012)  . CAD (coronary atherosclerotic disease)    s/ PCI left circ/OM 09/1999, cutting balloon PCI mid LAD 08/2001, , chronically untreated tortuous lateral marginal branch, DES to prox to mid LAD and DES to mid circ 10/13,  patent stents in the prox/mid LAD and mid LCx/OM1 with mild diffuse restenosis of the LAD/Diag, mild disease in the RCA with mild LV dysfunction with EF 45-50%  . Depression    "wife died 2012/02/06" (07-23-2012)  . Diabetic  peripheral angiopathy ([place])   . GERD (gastroesophageal reflux disease)   . High cholesterol    intolerant to Lipitor/Crestor  . History of blood transfusion 1990's   S/P back OR  . Hypertension   . Hypothyroidism   . Persistent atrial fibrillation ([place])   . Type II diabetes mellitus ([place])   . Wears glasses     Past Surgical History:  Procedure Laterality Date  . BACK SURGERY    . BACK SURGERY    . CARDIAC CATHETERIZATION N/A 09/16/2015   Procedure: Left Heart Cath and Coronary Angiography;  Surgeon: Burnell Blanks, MD;  Location: [facility];  Service: Cardiovascular;  Laterality: N/A;  . CHOLECYSTECTOMY  2012  . CORONARY ANGIOPLASTY WITH STENT PLACEMENT  2000; Jul 23, 2012   "3 + 2; total of 5" (07/23/12)  . [place] SURGERY  1997  . PERCUTANEOUS CORONARY STENT INTERVENTION (PCI-S) N/A 07/23/2012   Procedure: PERCUTANEOUS CORONARY STENT INTERVENTION (PCI-S);  Surgeon: Jettie Booze, MD;  Location: [facility] CATH LAB;  Service: Cardiovascular;  Laterality: N/A;  . POSTERIOR FUSION LUMBAR SPINE  ~ 1998  . TYMPANOSTOMY TUBE PLACEMENT      Current Outpatient Medications  Medication Sig Dispense Refill  . acetaminophen (TYLENOL) 500 MG tablet Take 500-1,000 mg by mouth 2 (two) times daily. Take 1 tablet (500 mg) by mouth every morning and 2 tablets (1000 mg) at bedtime    . amiodarone (PACERONE) 100 MG tablet Take 1 tablet (100 mg total) by mouth daily. (  Patient taking differently: Take 200 mg by mouth daily. ) 90 tablet 3  . amLODipine (NORVASC) 2.5 MG tablet Take 1 tablet (2.5 mg total) by mouth daily. 90 tablet 3  . cimetidine (TAGAMET) 200 MG tablet Take 200 mg by mouth 2 (two) times daily. Takes 1-2 tablets daily    . fluticasone (FLONASE) 50 MCG/ACT nasal spray 1 spray daily.     . hydrochlorothiazide (HYDRODIURIL) 50 MG tablet Take 50 mg by mouth daily.   0  . insulin regular (NOVOLIN R) 100 units/mL injection Inject 3 to 8 units into skin with meals per  sliding scale    . isosorbide mononitrate (IMDUR) 60 MG 24 hr tablet TAKE 1 AND 1/2 TABLETS BY MOUTH AT BEDTIME 135 tablet 1  . LANTUS SOLOSTAR 100 UNIT/ML Solostar Pen Inject 42 Units into the skin at bedtime.    Marland Kitchen levothyroxine (SYNTHROID, LEVOTHROID) 175 MCG tablet Take 175 mcg by mouth daily before breakfast.     . meclizine (ANTIVERT) 25 MG tablet Take 25 mg by mouth 3 (three) times daily.     . metFORMIN (GLUCOPHAGE) 1000 MG tablet Take 1,000 mg by mouth 2 (two) times daily.    . nitroGLYCERIN (NITROSTAT) 0.4 MG SL tablet Place 0.4 mg under the tongue every 5 (five) minutes as needed for chest pain.    . pravastatin (PRAVACHOL) 40 MG tablet TAKE 1 TABLET BY MOUTH DAILY IN THE EVENING 90 tablet 1  . terazosin (HYTRIN) 10 MG capsule Taking two tablets daily    . warfarin (COUMADIN) 5 MG tablet Take 2.5-5 mg by mouth daily with supper. Take 1 tablet (5 mg) by mouth Monday and tuesday, then take 1/2 tablet Wednesday, 5mg  on Thursday, Friday 1/2 tablet on Saturday and 5mg  tablet on Sunday     No current facility-administered medications for this visit.    Facility-Administered Medications Ordered in Other Visits  Medication Dose Route Frequency Provider Last Rate Last Dose  . aminophylline injection 75 mg  75 mg Intravenous BID PRN Dorothy Spark, MD   75 mg at 08/31/15 1005    Allergies:   Patient has no known allergies.   Social History:  The patient  reports that he quit smoking about 29 years ago. His smoking use included cigarettes. He has a 40.00 pack-year smoking history. He has never used smokeless tobacco. He reports current alcohol use. He reports that he does not use drugs.   Family History:  The patient's family history includes Cancer in his father and mother.   ROS:  Please see the history of present illness.   All other systems are personally reviewed and negative.    Exam:    Vital Signs:  BP 108/71   Pulse 97   Ht 6' (1.829 m)   Wt 192 lb (87.1 kg)   BMI 26.04  kg/m   Well sounding, alert and conversant, regular work of breathing   Labs/Other Tests and Data Reviewed:    Recent Labs: 12/03/2018: BUN 13; Creatinine, Ser 0.96; Hemoglobin 13.2; Platelets 144; Potassium 4.2; Sodium 136 06/14/2019: ALT 19; TSH 1.380   Wt Readings from Last 3 Encounters:  09/04/19 192 lb (87.1 kg)  07/29/19 190 lb 3.2 oz (86.3 kg)  07/24/19 190 lb 6.4 oz (86.4 kg)      ASSESSMENT & PLAN:    1.  Paroxysmal atrial fibrillation/?atrial flutter (appears to be mostly coarse AF by recent monitor) The patient has symptomatic, recurrent paroxysmal atrial fibrillation. he has failed medical therapy with  amiodarone. Chads2vasc score is 5.  he is anticoagulated with Warfarin. Therapeutic strategies for afib including medicine and ablation were discussed in detail with the patient today. Risk, benefits, and alternatives to EP study and radiofrequency ablation for afib were also discussed in detail today. These risks include but are not limited to stroke, bleeding, vascular damage, tamponade, perforation, damage to the esophagus, lungs, and other structures, pulmonary vein stenosis, worsening renal function, and death. The patient understands these risk and wishes to proceed.  We will therefore proceed with catheter ablation at the next available time.  Carto, ICE, anesthesia are requested for the procedure.  Will also obtain cardiac CT prior to the procedure to exclude LAA thrombus and further evaluate atrial anatomy. Change from Warfarin to Xarelto in anticipation of ablation if he decides to proceed.  He is concerned about costs - he will look into the cost.  Will have my nurse call and follow up with patient to see if he would like to proceed.   2.  CAD No recent ischemic symptoms Continue current therapy  3.  HTN Stable No change required today  4.  Aortic stenosis Mild to moderate by echo 04/2019   Follow-up:  By phone in next couple of weeks with my nurse     Patient Risk:  after full review of this patients clinical status, I feel that they are at moderate risk at this time.  Today, I have spent 15 minutes with the patient with telehealth technology discussing arrhythmia management .    Army Fossa, MD  09/04/2019 9:41 AM     [facility] [address] [place] [address] [phone] (office) [phone] (fax)

## 2019-09-18 ENCOUNTER — Telehealth: Payer: Self-pay | Admitting: Internal Medicine

## 2019-09-18 NOTE — Telephone Encounter (Signed)
New Message:  Patient had some more questions about the possibility of having an ablation soon. There has not been anything decided yet. He had a few questions for Dr. Rayann Heman or his nurse Sonia Baller. His questions include: -What /are there any benefits of having the ablation done? -Would it reduce the risk of Stroke? -How much would the procedure cost?   He also spoke with Medicare and wanted to know if Dr. Rayann Heman would accept any form of payment that Medicare would put towards the procedure

## 2019-09-23 NOTE — Telephone Encounter (Signed)
Returned call to Pt.  Pt cannot remember what Dr. Rayann Heman said about risk/benefits of ablation.  Advised that Dr. Rayann Heman had recommended an ablation for Pt because he had symptomatic afib even on amiodarone.  This nurse unsure if risk of stroke would decrease after ablation.  Will follow up with our billing dept to determine any OOP costs associated with procedure.

## 2019-09-25 NOTE — Telephone Encounter (Signed)
Per review of [facility] website CPT code [phone] bills at $30,000.  Outreach made to billing dept.  Employee assisted this nurse.  States billing is unable to provide possible OOP costs.   Gave number to preservice dept [phone].  States this dept may be able to assist nurse further.  Left message to call back.

## 2019-09-25 NOTE — Telephone Encounter (Signed)
Per preservice dept-once pt reaches his deductible his procedure will be covered at 100%  Pt notified.  He will think about the procedure and get back to this nurse if he decides to proceed.

## 2019-10-21 ENCOUNTER — Other Ambulatory Visit: Payer: Self-pay | Admitting: Cardiology

## 2019-10-29 ENCOUNTER — Ambulatory Visit: Payer: Medicare Other

## 2019-11-11 ENCOUNTER — Other Ambulatory Visit: Payer: Self-pay | Admitting: Cardiology

## 2019-11-15 ENCOUNTER — Ambulatory Visit: Payer: Medicare Other

## 2019-11-26 ENCOUNTER — Other Ambulatory Visit: Payer: Self-pay | Admitting: Cardiology

## 2019-11-26 DIAGNOSIS — I480 Paroxysmal atrial fibrillation: Secondary | ICD-10-CM

## 2019-12-13 ENCOUNTER — Telehealth: Payer: Self-pay | Admitting: Internal Medicine

## 2019-12-13 NOTE — Telephone Encounter (Signed)
Patient calling the office for samples of medication:   1.  What medication and dosage are you requesting samples for? Riley Jordan 5 MG  2.  Are you currently out of this medication? No

## 2019-12-16 NOTE — Telephone Encounter (Signed)
Pt calling asking for samples of Eliquis, but pt's medication list states that he takes Warfarin. Pt stating that his PCP put him on Eliquis. Pt would like a call back concerning this matter. Please address

## 2019-12-16 NOTE — Telephone Encounter (Signed)
Returned call to Pt.  Per Pt when he saw Dr. Arelia Sneddon he was switched to Eliquis.  Will get recent OV notes from Dr. Arelia Sneddon.  Advised Pt that he only needed to start Eliquis/Xarelto if he  Wanted to go ahead with ablation.  Per Pt he is thinking he may do it, so he wants to stay on Eliquis.  Will leave some samples for pick up.  Will also forward to Pt assistance to see if anything can be done to assist Pt to afford Eliquis.  Will review PCP notes first before updating med list.

## 2019-12-17 NOTE — Telephone Encounter (Signed)
Received OV notes from Dr. Arelia Sneddon.  Dr. Arelia Sneddon changed Pt to Eliquis in December 2020.  Updated Pt's med list.  Will leave samples for pick up.

## 2019-12-24 ENCOUNTER — Other Ambulatory Visit: Payer: Self-pay

## 2019-12-24 ENCOUNTER — Ambulatory Visit (HOSPITAL_COMMUNITY): Payer: Medicare Other | Attending: Cardiology

## 2019-12-24 DIAGNOSIS — I350 Nonrheumatic aortic (valve) stenosis: Secondary | ICD-10-CM | POA: Insufficient documentation

## 2019-12-27 ENCOUNTER — Telehealth: Payer: Self-pay | Admitting: Cardiology

## 2019-12-27 DIAGNOSIS — I350 Nonrheumatic aortic (valve) stenosis: Secondary | ICD-10-CM

## 2019-12-27 NOTE — Telephone Encounter (Signed)
Riley Margarita, MD  Antonieta Iba, RN  Echo showed normal heart function with mildly thickened heart muscle and increased stiffness of heart muscle, mildly enlarged RV and enlargement of the RA and LA, mild AS and ascending aorta upper limits of normal at 67mm>>repeat 2D echo in 1 year for AS   Left message for patient with results.

## 2019-12-27 NOTE — Telephone Encounter (Signed)
   Pt returning call regarding echo results. Pt requested to try to call him on his home phone or mobile, if he did not answer to leave a detailed message on his vm.  Please call

## 2020-01-06 ENCOUNTER — Telehealth: Payer: Self-pay | Admitting: Internal Medicine

## 2020-01-06 NOTE — Telephone Encounter (Signed)
New Message    Pt is calling to speak with Sonia Baller He says he would like to set up the appointment for an ablation    Please advise

## 2020-01-06 NOTE — Telephone Encounter (Signed)
Pt calling office to schedule ablation.  Since last visit 09/2019 Pt had repeat echo with some changes in his heart structure.  Advised he rediscuss ablation with Dr. Rayann Heman.    Appt scheduled for April 22.

## 2020-01-23 ENCOUNTER — Encounter: Payer: Self-pay | Admitting: Internal Medicine

## 2020-01-23 ENCOUNTER — Ambulatory Visit (INDEPENDENT_AMBULATORY_CARE_PROVIDER_SITE_OTHER): Payer: Medicare Other | Admitting: Internal Medicine

## 2020-01-23 ENCOUNTER — Other Ambulatory Visit: Payer: Self-pay

## 2020-01-23 VITALS — BP 128/78 | HR 55 | Ht 72.0 in | Wt 183.8 lb

## 2020-01-23 DIAGNOSIS — I2510 Atherosclerotic heart disease of native coronary artery without angina pectoris: Secondary | ICD-10-CM

## 2020-01-23 DIAGNOSIS — I350 Nonrheumatic aortic (valve) stenosis: Secondary | ICD-10-CM

## 2020-01-23 DIAGNOSIS — I10 Essential (primary) hypertension: Secondary | ICD-10-CM

## 2020-01-23 DIAGNOSIS — I4819 Other persistent atrial fibrillation: Secondary | ICD-10-CM

## 2020-01-23 NOTE — Progress Notes (Signed)
PCP: Leonard Downing, MD Primary Cardiologist: Dr Radford Pax Primary EP: Dr Rayann Heman  Riley Jordan is a 84 y.o. male who presents today for routine electrophysiology followup.  Since last being seen in our clinic, the patient reports doing very well.  Today, he denies symptoms of palpitations, chest pain, shortness of breath,  lower extremity edema, dizziness, presyncope, or syncope.  The patient is otherwise without complaint today.   Past Medical History:  Diagnosis Date  . Aortic stenosis 02/16/2016   mild by echo 2018  . Arthritis    "back; hands" (08/07/2012)  . CAD (coronary atherosclerotic disease)    s/ PCI left circ/OM 09/1999, cutting balloon PCI mid LAD 08/2001, , chronically untreated tortuous lateral marginal branch, DES to prox to mid LAD and DES to mid circ 10/13,  patent stents in the prox/mid LAD and mid LCx/OM1 with mild diffuse restenosis of the LAD/Diag, mild disease in the RCA with mild LV dysfunction with EF 45-50%  . Depression    "wife died 02/20/12" (08/07/2012)  . Diabetic peripheral angiopathy ([place])   . GERD (gastroesophageal reflux disease)   . High cholesterol    intolerant to Lipitor/Crestor  . History of blood transfusion 1990's   S/P back OR  . Hypertension   . Hypothyroidism   . Persistent atrial fibrillation ([place])   . Type II diabetes mellitus ([place])   . Wears glasses    Past Surgical History:  Procedure Laterality Date  . BACK SURGERY    . BACK SURGERY    . CARDIAC CATHETERIZATION N/A 09/16/2015   Procedure: Left Heart Cath and Coronary Angiography;  Surgeon: Burnell Blanks, MD;  Location: [place] [facility];  Service: Cardiovascular;  Laterality: N/A;  . CHOLECYSTECTOMY  2012  . CORONARY ANGIOPLASTY WITH STENT PLACEMENT  2000; 08-07-12   "3 + 2; total of 5" (08/07/12)  . [place] SURGERY  1997  . PERCUTANEOUS CORONARY STENT INTERVENTION (PCI-S) N/A August 07, 2012   Procedure: PERCUTANEOUS CORONARY STENT INTERVENTION (PCI-S);   Surgeon: Jettie Booze, MD;  Location: [facility] CATH LAB;  Service: Cardiovascular;  Laterality: N/A;  . POSTERIOR FUSION LUMBAR SPINE  ~ 1998  . TYMPANOSTOMY TUBE PLACEMENT      ROS- all systems are reviewed and negatives except as per HPI above  Current Outpatient Medications  Medication Sig Dispense Refill  . acetaminophen (TYLENOL) 500 MG tablet Take 500-1,000 mg by mouth 2 (two) times daily. Take 1 tablet (500 mg) by mouth every morning and 2 tablets (1000 mg) at bedtime    . amiodarone (PACERONE) 200 MG tablet TAKE 1 TABLET BY MOUTH DAILY 90 tablet 2  . amLODipine (NORVASC) 2.5 MG tablet Take 1 tablet (2.5 mg total) by mouth daily. 90 tablet 3  . ELIQUIS 5 MG TABS tablet Take 5 mg by mouth 2 (two) times daily.    . famotidine (PEPCID) 20 MG tablet Take 20 mg by mouth 2 (two) times daily.    . fluticasone (FLONASE) 50 MCG/ACT nasal spray 1 spray daily.     Marland Kitchen HUMALOG KWIKPEN 100 UNIT/ML KwikPen Inject 1 mL into the skin 3 (three) times daily.    . hydrochlorothiazide (HYDRODIURIL) 50 MG tablet Take 50 mg by mouth daily.   0  . insulin regular (NOVOLIN R) 100 units/mL injection Inject 3 to 8 units into skin with meals per sliding scale    . isosorbide mononitrate (IMDUR) 60 MG 24 hr tablet TAKE 1 AND 1/2 TABLETS BY MOUTH AT BEDTIME 135 tablet  2  . LANTUS SOLOSTAR 100 UNIT/ML Solostar Pen Inject 42 Units into the skin at bedtime.    Marland Kitchen levothyroxine (SYNTHROID, LEVOTHROID) 175 MCG tablet Take 175 mcg by mouth daily before breakfast.     . meclizine (ANTIVERT) 25 MG tablet Take 25 mg by mouth 3 (three) times daily.     . metFORMIN (GLUCOPHAGE) 1000 MG tablet Take 1,000 mg by mouth 2 (two) times daily.    . nitroGLYCERIN (NITROSTAT) 0.4 MG SL tablet Place 0.4 mg under the tongue every 5 (five) minutes as needed for chest pain.    . pravastatin (PRAVACHOL) 40 MG tablet TAKE 1 TABLET BY MOUTH DAILY IN THE EVENING 90 tablet 1   No current facility-administered medications for this visit.    Facility-Administered Medications Ordered in Other Visits  Medication Dose Route Frequency Provider Last Rate Last Admin  . aminophylline injection 75 mg  75 mg Intravenous BID PRN Dorothy Spark, MD   75 mg at 08/31/15 1005    Physical Exam: Vitals:   01/23/20 0919  BP: 128/78  Pulse: (!) 55  SpO2: 98%  Weight: 183 lb 12.8 oz (83.4 kg)  Height: 6' (1.829 m)    GEN- The patient is well appearing, alert and oriented x 3 today.   Head- normocephalic, atraumatic Eyes-  Sclera clear, conjunctiva pink Ears- hearing intact Oropharynx- clear Lungs- Clear to ausculation bilaterally, normal work of breathing Heart- Regular rate and rhythm, no murmurs, rubs or gallops, PMI not laterally displaced GI- soft, NT, ND, + BS Extremities- no clubbing, cyanosis, or edema  Wt Readings from Last 3 Encounters:  01/23/20 183 lb 12.8 oz (83.4 kg)  09/04/19 192 lb (87.1 kg)  07/29/19 190 lb 3.2 oz (86.3 kg)    EKG tracing ordered today is personally reviewed and shows sinus bradycardia 55 bpm, PR 188 msec, QRS 102 msec, QTc 461 msec, nonspecific ST/T changes  Assessment and Plan:  1. Paroxysmal atrial fibrillation/ atrial flutter  He feels well currently.  Pleased with current state. Chads2vasc score is 5.  he is anticoagulated with eliquis . Therapeutic strategies for afib including medicine and ablation were discussed in detail with the patient today. Risk, benefits, and alternatives to EP study and radiofrequency ablation for afib were also discussed in detail today.  At this time, he is not ready to proceed with ablation.  He would like to continue medical therapy with amidarone. We will check tsh and lfts today The importance of close follow-up to avoid toxicity with amiodarone was discussed  2. CAD No ischemic symptoms  3. HTN Stable No change required today  4. Aortic stenosis Mild to moderate by prior echo  Follow-up in AF clinic and with Dr Radford Pax.  I will see when  needed  Thompson Grayer MD, [facility] 01/23/2020 9:29 AM

## 2020-01-23 NOTE — Patient Instructions (Addendum)
Medication Instructions:  Your physician recommends that you continue on your current medications as directed. Please refer to the Current Medication list given to you today.  Labwork: You will get lab work today:  TSH and liver panel  Testing/Procedures: None ordered.  Follow-Up: Your physician wants you to follow-up in: 3 months with the afib clinic.   You will receive a reminder letter in the mail two months in advance. If you don't receive a letter, please call our office to schedule the follow-up appointment.   Any Other Special Instructions Will Be Listed Below (If Applicable).  If you need a refill on your cardiac medications before your next appointment, please call your pharmacy.

## 2020-03-04 ENCOUNTER — Telehealth: Payer: Self-pay | Admitting: Internal Medicine

## 2020-03-04 NOTE — Telephone Encounter (Signed)
Spoke with the patient who states that his Eliquis has doubled in price and he would like to switch back to warfarin.  He was switched to Eliquis by Dr. Arelia Sneddon in December 2020. Patient also states that it was decided with Dr. Rayann Heman that he would not go through with ablation.

## 2020-03-04 NOTE — Telephone Encounter (Signed)
Pt c/o medication issue:  1. Name of Medication: ELIQUIS 5 MG TABS tablet  2. How are you currently taking this medication (dosage and times per day)? 1 5 MG tablet two times daily   3. Are you having a reaction (difficulty breathing--STAT)? No   4. What is your medication issue? Schad is calling stating he is in the donut hole and his copay has doubled. He would like to know if he can switch from Eliquis to warfarin due to this. [place] is requesting Sonia Baller be the one to call him back in regards to this. Please advise.

## 2020-03-04 NOTE — Telephone Encounter (Signed)
I am fine with him going back to warfarin but will need to be seen in coumadin clinic to get back started  [facility]

## 2020-03-05 NOTE — Telephone Encounter (Signed)
Spoke with patient. He would like to have his PCP manage his INR as he was previously having done. I advised the patient that he will need to reach out to his PCP and let them know that cardiology has approved the switch back to warfarin and they will need to provide patient with Rx for warfarin and instruction on how to switch and follow up INR checks.

## 2020-03-26 LAB — PROTIME-INR

## 2020-04-02 LAB — PROTIME-INR

## 2020-04-15 ENCOUNTER — Other Ambulatory Visit: Payer: Self-pay

## 2020-04-15 ENCOUNTER — Encounter (HOSPITAL_COMMUNITY): Payer: Self-pay | Admitting: Nurse Practitioner

## 2020-04-15 ENCOUNTER — Ambulatory Visit (HOSPITAL_COMMUNITY)
Admission: RE | Admit: 2020-04-15 | Discharge: 2020-04-15 | Disposition: A | Discharge status: Home / Self Care | Payer: Medicare Other | Source: Ambulatory Visit | Attending: Nurse Practitioner | Admitting: Nurse Practitioner

## 2020-04-15 VITALS — BP 130/80 | HR 59 | Ht 72.0 in | Wt 181.8 lb

## 2020-04-15 DIAGNOSIS — I10 Essential (primary) hypertension: Secondary | ICD-10-CM | POA: Diagnosis not present

## 2020-04-15 DIAGNOSIS — E785 Hyperlipidemia, unspecified: Secondary | ICD-10-CM | POA: Diagnosis not present

## 2020-04-15 DIAGNOSIS — Z87891 Personal history of nicotine dependence: Secondary | ICD-10-CM | POA: Diagnosis not present

## 2020-04-15 DIAGNOSIS — I480 Paroxysmal atrial fibrillation: Secondary | ICD-10-CM | POA: Diagnosis present

## 2020-04-15 DIAGNOSIS — Z955 Presence of coronary angioplasty implant and graft: Secondary | ICD-10-CM | POA: Diagnosis not present

## 2020-04-15 DIAGNOSIS — D6869 Other thrombophilia: Secondary | ICD-10-CM | POA: Diagnosis not present

## 2020-04-15 DIAGNOSIS — Z7901 Long term (current) use of anticoagulants: Secondary | ICD-10-CM | POA: Insufficient documentation

## 2020-04-15 DIAGNOSIS — Z79890 Hormone replacement therapy (postmenopausal): Secondary | ICD-10-CM | POA: Diagnosis not present

## 2020-04-15 DIAGNOSIS — Z981 Arthrodesis status: Secondary | ICD-10-CM | POA: Insufficient documentation

## 2020-04-15 DIAGNOSIS — I2510 Atherosclerotic heart disease of native coronary artery without angina pectoris: Secondary | ICD-10-CM | POA: Diagnosis not present

## 2020-04-15 DIAGNOSIS — M159 Polyosteoarthritis, unspecified: Secondary | ICD-10-CM | POA: Insufficient documentation

## 2020-04-15 DIAGNOSIS — Z79899 Other long term (current) drug therapy: Secondary | ICD-10-CM | POA: Diagnosis not present

## 2020-04-15 DIAGNOSIS — E039 Hypothyroidism, unspecified: Secondary | ICD-10-CM | POA: Insufficient documentation

## 2020-04-15 DIAGNOSIS — Z9049 Acquired absence of other specified parts of digestive tract: Secondary | ICD-10-CM | POA: Diagnosis not present

## 2020-04-15 DIAGNOSIS — I350 Nonrheumatic aortic (valve) stenosis: Secondary | ICD-10-CM | POA: Insufficient documentation

## 2020-04-15 DIAGNOSIS — K219 Gastro-esophageal reflux disease without esophagitis: Secondary | ICD-10-CM | POA: Insufficient documentation

## 2020-04-15 DIAGNOSIS — E1151 Type 2 diabetes mellitus with diabetic peripheral angiopathy without gangrene: Secondary | ICD-10-CM | POA: Insufficient documentation

## 2020-04-15 DIAGNOSIS — Z794 Long term (current) use of insulin: Secondary | ICD-10-CM | POA: Diagnosis not present

## 2020-04-15 LAB — COMPREHENSIVE METABOLIC PANEL
ALT: 21 U/L (ref 0–44)
AST: 25 U/L (ref 15–41)
Albumin: 3.9 g/dL (ref 3.5–5.0)
Alkaline Phosphatase: 56 U/L (ref 38–126)
Anion gap: 9 (ref 5–15)
BUN: 9 mg/dL (ref 8–23)
CO2: 27 mmol/L (ref 22–32)
Calcium: 9.5 mg/dL (ref 8.9–10.3)
Chloride: 95 mmol/L — ABNORMAL LOW (ref 98–111)
Creatinine, Ser: 0.94 mg/dL (ref 0.61–1.24)
GFR calc Af Amer: >60 mL/min (ref >60)
GFR calc non Af Amer: >60 mL/min (ref >60)
Glucose, Bld: 144 mg/dL — ABNORMAL HIGH (ref 70–99)
Potassium: 4.1 mmol/L (ref 3.5–5.1)
Sodium: 131 mmol/L — ABNORMAL LOW (ref 135–145)
Total Bilirubin: 1.6 mg/dL — ABNORMAL HIGH (ref 0.3–1.2)
Total Protein: 6.8 g/dL (ref 6.5–8.1)

## 2020-04-15 LAB — TSH: TSH: 1.355 u[IU]/mL (ref 0.350–4.500)

## 2020-04-15 NOTE — Progress Notes (Addendum)
Primary Care Physician: Leonard Downing, MD Referring Physician:Dr. Josealfredo Adkins is a 84 y.o. male with a h/o CAD, paroxysmal afib, aortic stenosis, DM, that was seen 9/9 for f/u with Dr. Radford Pax. He c/o of fatigue and noticing HR's in the 40-50's. Amiodarone was reduced to 100 mg daily and 2 week ZIO patch was placed. It was turned in last week and results are not back. He saw his PCP on  Monday, after seeing [name] on Sunday in the 110's. He was in afib and amiodarone was increased back to 200 mg a day. Now his HR's are back in the 60/70's at home and pt thought he was back in rhythm as he has felt well. EKG confirms afib with CVR at 75 bpm.   F/u in afib clinic, 10/26. The Holter monitor that pt wore 9/18 through 10/2 report was reviewed as interpreted by Dr. Radford Pax and showed 15% afib burden. He is in the clinic today with atrial flutter with rate controlled. He is still unsure if he is paroxysmal or persistent . He continues on 200 mg amiodarone a day.He states some days he feels better with HR's in the  60's other days he is more bothered with exertional dyspnea and fatigue. He has been getting weekly INR's to prepare for a DCCV if needed. We have not been receiving these from  his PCP but will request again. He was sub therapeutic at 1.8 2 weeks ago.    F/u afib clinic,  04/15/20. He is staying in SR on amiodarone. He has had recent ssues with vertigo and had antivert and Epley maneuvers with some improvement. CHA2DS2VASc score of at least 5, continues on coumadin.   Today, he denies symptoms of palpitations, chest pain, shortness of breath, orthopnea, PND, lower extremity edema, dizziness, presyncope, syncope, or neurologic sequela. The patient is tolerating medications without difficulties and is otherwise without complaint today.   Past Medical History:  Diagnosis Date  . Aortic stenosis 02/16/2016   mild by echo 2018  . Arthritis    "back; hands" (25-Jul-2012)  .  CAD (coronary atherosclerotic disease)    s/ PCI left circ/OM 09/1999, cutting balloon PCI mid LAD 08/2001, , chronically untreated tortuous lateral marginal branch, DES to prox to mid LAD and DES to mid circ 10/13,  patent stents in the prox/mid LAD and mid LCx/OM1 with mild diffuse restenosis of the LAD/Diag, mild disease in the RCA with mild LV dysfunction with EF 45-50%  . Depression    "wife died 02/07/2012" (07-25-12)  . Diabetic peripheral angiopathy ([place])   . GERD (gastroesophageal reflux disease)   . High cholesterol    intolerant to Lipitor/Crestor  . History of blood transfusion 1990's   S/P back OR  . Hypertension   . Hypothyroidism   . Persistent atrial fibrillation ([place])   . Type II diabetes mellitus ([place])   . Wears glasses    Past Surgical History:  Procedure Laterality Date  . BACK SURGERY    . BACK SURGERY    . CARDIAC CATHETERIZATION N/A 09/16/2015   Procedure: Left Heart Cath and Coronary Angiography;  Surgeon: Burnell Blanks, MD;  Location: [place] [facility];  Service: Cardiovascular;  Laterality: N/A;  . CHOLECYSTECTOMY  2012  . CORONARY ANGIOPLASTY WITH STENT PLACEMENT  2000; 07/25/12   "3 + 2; total of 5" (07-25-2012)  . [place] SURGERY  1997  . PERCUTANEOUS CORONARY STENT INTERVENTION (PCI-S) N/A 07-25-12   Procedure: PERCUTANEOUS  CORONARY STENT INTERVENTION (PCI-S);  Surgeon: Jettie Booze, MD;  Location: [facility] CATH LAB;  Service: Cardiovascular;  Laterality: N/A;  . POSTERIOR FUSION LUMBAR SPINE  ~ 1998  . TYMPANOSTOMY TUBE PLACEMENT      Current Outpatient Medications  Medication Sig Dispense Refill  . acetaminophen (TYLENOL) 500 MG tablet Take 500-1,000 mg by mouth 2 (two) times daily. Take 1 tablet (500 mg) by mouth every morning and 2 tablets (1000 mg) at bedtime    . amiodarone (PACERONE) 200 MG tablet TAKE 1 TABLET BY MOUTH DAILY 90 tablet 2  . amLODipine (NORVASC) 2.5 MG tablet Take 1 tablet (2.5 mg total) by mouth daily. 90  tablet 3  . famotidine (PEPCID) 20 MG tablet Take 20 mg by mouth 2 (two) times daily.    . fluticasone (FLONASE) 50 MCG/ACT nasal spray 1 spray daily.     Marland Kitchen HUMALOG KWIKPEN 100 UNIT/ML KwikPen Inject 1 mL into the skin 3 (three) times daily.    . hydrochlorothiazide (HYDRODIURIL) 50 MG tablet Take 50 mg by mouth daily.   0  . insulin regular (NOVOLIN R) 100 units/mL injection Inject 3 to 8 units into skin with meals per sliding scale    . isosorbide mononitrate (IMDUR) 60 MG 24 hr tablet TAKE 1 AND 1/2 TABLETS BY MOUTH AT BEDTIME 135 tablet 2  . LANTUS SOLOSTAR 100 UNIT/ML Solostar Pen Inject 42 Units into the skin at bedtime.    Marland Kitchen levothyroxine (SYNTHROID, LEVOTHROID) 175 MCG tablet Take 175 mcg by mouth daily before breakfast.     . meclizine (ANTIVERT) 25 MG tablet Take 25 mg by mouth 3 (three) times daily.     . metFORMIN (GLUCOPHAGE) 1000 MG tablet Take 1,000 mg by mouth 2 (two) times daily.    . nitroGLYCERIN (NITROSTAT) 0.4 MG SL tablet Place 0.4 mg under the tongue every 5 (five) minutes as needed for chest pain.    . pravastatin (PRAVACHOL) 40 MG tablet TAKE 1 TABLET BY MOUTH DAILY IN THE EVENING 90 tablet 1  . warfarin (COUMADIN) 5 MG tablet Take 5 mg by mouth daily.     No current facility-administered medications for this encounter.   Facility-Administered Medications Ordered in Other Encounters  Medication Dose Route Frequency Provider Last Rate Last Admin  . aminophylline injection 75 mg  75 mg Intravenous BID PRN Dorothy Spark, MD   75 mg at 08/31/15 1005    No Known Allergies  Social History   Socioeconomic History  . Marital status: Widowed    Spouse name: Not on file  . Number of children: Not on file  . Years of education: Not on file  . Highest education level: Not on file  Occupational History  . Not on file  Tobacco Use  . Smoking status: Former Smoker    Packs/day: 1.00    Years: 40.00    Pack years: 40.00    Types: Cigarettes    Quit date:  09/26/1989    Years since quitting: 30.5  . Smokeless tobacco: Never Used  Substance and Sexual Activity  . Alcohol use: Yes    Comment: 07/12/2012 "daquiri maybe 3X/yr"  . Drug use: No  . Sexual activity: Never  Other Topics Concern  . Not on file  Social History Narrative  . Not on file   Social Determinants of Health   Financial Resource Strain:   . Difficulty of Paying Living Expenses:   Food Insecurity:   . Worried About Charity fundraiser in  the Last Year:   . [place] in the Last Year:   Transportation Needs:   . Film/video editor (Medical):   Marland Kitchen Lack of Transportation (Non-Medical):   Physical Activity:   . Days of Exercise per Week:   . Minutes of Exercise per Session:   Stress:   . Feeling of Stress :   Social Connections:   . Frequency of Communication with Friends and Family:   . Frequency of Social Gatherings with Friends and Family:   . Attends Religious Services:   . Active Member of Clubs or Organizations:   . Attends Archivist Meetings:   Marland Kitchen Marital Status:   Intimate Partner Violence:   . Fear of Current or Ex-Partner:   . Emotionally Abused:   Marland Kitchen Physically Abused:   . Sexually Abused:     Family History  Problem Relation Age of Onset  . Cancer Mother   . Cancer Father     ROS- All systems are reviewed and negative except as per the HPI above  Physical Exam: Vitals:   04/15/20 1050  BP: 130/80  Pulse: (!) 59  Weight: 82.5 kg  Height: 6' (1.829 m)   Wt Readings from Last 3 Encounters:  04/15/20 82.5 kg  01/23/20 83.4 kg  09/04/19 87.1 kg    Labs: Lab Results  Component Value Date   NA 136 12/03/2018   K 4.2 12/03/2018   CL 98 12/03/2018   CO2 23 12/03/2018   GLUCOSE 283 (H) 12/03/2018   BUN 13 12/03/2018   CREATININE 0.96 12/03/2018   CALCIUM 9.3 12/03/2018   MG 2.0 03/13/2011   Lab Results  Component Value Date   INR 1.8 (A) 07/15/2019   Lab Results  Component Value Date   CHOL 109 12/03/2018    HDL 41 12/03/2018   LDLCALC 51 12/03/2018   TRIG 85 12/03/2018     GEN- The patient is well appearing, alert and oriented x 3 today.   Head- normocephalic, atraumatic Eyes-  Sclera clear, conjunctiva pink Ears- hearing intact Oropharynx- clear Neck- supple, no JVP Lymph- no cervical lymphadenopathy Lungs- Clear to ausculation bilaterally, normal work of breathing Heart- regular rate and rhythm, 2/6 systolic  murmur, rubs or gallops, PMI not laterally displaced GI- soft, NT, ND, + BS Extremities- no clubbing, cyanosis, or edema MS- no significant deformity or atrophy Skin- no rash or lesion Psych- euthymic mood, full affect Neuro- strength and sensation are intact  EKG- sinus brady at 59 bpm, pr int 182 ms, qrs int 98 ms, qrs int 463 ms   Epic results reviewed   Assessment and Plan: 1. Afib Appears  to be staying in SR with amiodarone 200 mg daily  Continue  warfarin for a CHA2DS2VASc score of 4 Bmet/tsh today   2. HTN Stable   3. Aortic stenosis Mild to mod by prior echo   F/u with Dr. Radford Pax 9/10 and afib clinic  in 9 months  Butch Penny C. Payden Docter, [facility] [address] [place], [place] [address] [phone]

## 2020-05-12 ENCOUNTER — Other Ambulatory Visit: Payer: Self-pay | Admitting: Cardiology

## 2020-06-03 ENCOUNTER — Other Ambulatory Visit: Payer: Self-pay | Admitting: Family Medicine

## 2020-06-03 ENCOUNTER — Ambulatory Visit
Admission: RE | Admit: 2020-06-03 | Discharge: 2020-06-03 | Disposition: A | Discharge status: Home / Self Care | Payer: Medicare Other | Source: Ambulatory Visit | Attending: Family Medicine | Admitting: Family Medicine

## 2020-06-03 DIAGNOSIS — R634 Abnormal weight loss: Secondary | ICD-10-CM

## 2020-06-11 ENCOUNTER — Telehealth: Payer: Self-pay | Admitting: Oncology

## 2020-06-11 NOTE — Telephone Encounter (Signed)
Received a new hem referral from Dr. Arelia Sneddon for monoclonal gammopathy. Pt has been cld and scheduled to see Dr. Alen Blew on 10/5 at 11am. Letter mailed.

## 2020-06-11 NOTE — Progress Notes (Addendum)
Date:  06/12/2020   ID:  Riley Jordan, DOB [birth year], MRN [id number]   PCP:  Leonard Downing, MD  Cardiologist:  Fransico Him, MD Electrophysiologist:  None   Chief Complaint:  AS, CAD, HTN, HLD, PAF  History of Present Illness:     Riley Jordan is a 84 y.o. male with a hx of ASCAD (cath12/2016for CP showedpatent stents in the prox/mid LAD and mid LCx/OM1 with mild diffuse restenosis of the LAD/Diag,mild disease in the RCA with mild LV dysfunction with EF 45-50%), HTN, dyslipidemiaand persistentAF.    He is here today for followup and is doing well but has been having a lot of fatigue.  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 He is here today for followup and is doing well.  He denies any chest pain or pressure, SOB, DOE, PND, orthopnea, LE edema, dizziness, palpitations or syncope. He is compliant with his meds and is tolerating meds with no SE.     Prior CV studies:   The following studies were reviewed today: 2D echo 04/2020 IMPRESSIONS   1. Left ventricular ejection fraction, by estimation, is 60 to 65%. The  left ventricle has normal function. The left ventricle has no regional  wall motion abnormalities. There is mild left ventricular hypertrophy.  Left ventricular diastolic parameters  are consistent with Grade II diastolic dysfunction (pseudonormalization).  Elevated left atrial pressure.  2. Right ventricular systolic function is normal. The right ventricular  size is mildly enlarged. Tricuspid regurgitation signal is inadequate for  assessing PA pressure.  3. Left atrial size was mildly dilated.  4. Right atrial size was moderately dilated.  5. The mitral valve is abnormal. Moderate annular calcification. No  evidence of mitral valve regurgitation.  6. The aortic valve is abnormal. Severe calcifications. Aortic valve  regurgitation is not visualized. Mild aortic valve stenosis. Vmax 2.4  m/s,  MG 11 mmHg, AVA 1.6 cm^2, DI 0.4  7. Aortic dilatation noted. There is mild dilatation of the ascending  aorta measuring 37 mm.  8. The inferior vena cava is normal in size with greater than 50%  respiratory variability, suggesting right atrial pressure of 3 mmHg.   Past Medical History:  Diagnosis Date  . Aortic stenosis 02/16/2016   mild by echo 2018  . Arthritis    "back; hands" (08/02/12)  . CAD (coronary atherosclerotic disease)    s/ PCI left circ/OM 09/1999, cutting balloon PCI mid LAD 08/2001, , chronically untreated tortuous lateral marginal branch, DES to prox to mid LAD and DES to mid circ 10/13,  patent stents in the prox/mid LAD and mid LCx/OM1 with mild diffuse restenosis of the LAD/Diag, mild disease in the RCA with mild LV dysfunction with EF 45-50%  . Depression    "wife died 02/14/2012" (2012/08/02)  . Diabetic peripheral angiopathy ([place])   . GERD (gastroesophageal reflux disease)   . High cholesterol    intolerant to Lipitor/Crestor  . History of blood transfusion 1990's   S/P back OR  . Hypertension   . Hypothyroidism   . Persistent atrial fibrillation ([place])   . Type II diabetes mellitus ([place])   . Wears glasses    Past Surgical History:  Procedure Laterality Date  . BACK SURGERY    . BACK SURGERY    . CARDIAC CATHETERIZATION N/A 09/16/2015   Procedure: Left Heart Cath and Coronary Angiography;  Surgeon: Burnell Blanks, MD;  Location: [facility];  Service: Cardiovascular;  Laterality: N/A;  . CHOLECYSTECTOMY  2012  . CORONARY ANGIOPLASTY WITH STENT PLACEMENT  2000; 07/12/2012   "3 + 2; total of 5" (07/12/2012)  . [place] SURGERY  1997  . PERCUTANEOUS CORONARY STENT INTERVENTION (PCI-S) N/A 07/12/2012   Procedure: PERCUTANEOUS CORONARY STENT INTERVENTION (PCI-S);  Surgeon: Jettie Booze, MD;  Location: [facility];  Service: Cardiovascular;  Laterality: N/A;  . POSTERIOR FUSION LUMBAR SPINE  ~ 1998  . TYMPANOSTOMY TUBE  PLACEMENT       Current Meds  Medication Sig  . acetaminophen (TYLENOL) 500 MG tablet Take 500-1,000 mg by mouth 2 (two) times daily. Take 1 tablet (500 mg) by mouth every morning and 2 tablets (1000 mg) at bedtime  . amiodarone (PACERONE) 200 MG tablet TAKE 1 TABLET BY MOUTH DAILY  . famotidine (PEPCID) 20 MG tablet Take 20 mg by mouth 2 (two) times daily.  . fluticasone (FLONASE) 50 MCG/ACT nasal spray 1 spray daily.   Marland Kitchen HUMALOG KWIKPEN 100 UNIT/ML KwikPen Inject 1 mL into the skin 3 (three) times daily.  . hydrochlorothiazide (HYDRODIURIL) 50 MG tablet Take 50 mg by mouth daily.   . insulin regular (NOVOLIN R) 100 units/mL injection Inject 3 to 8 units into skin with meals per sliding scale  . isosorbide mononitrate (IMDUR) 60 MG 24 hr tablet TAKE 1 AND 1/2 TABLETS BY MOUTH AT BEDTIME  . LANTUS SOLOSTAR 100 UNIT/ML Solostar Pen Inject 42 Units into the skin at bedtime.  Marland Kitchen levothyroxine (SYNTHROID, LEVOTHROID) 175 MCG tablet Take 175 mcg by mouth daily before breakfast.   . meclizine (ANTIVERT) 25 MG tablet Take 25 mg by mouth 3 (three) times daily.   . metFORMIN (GLUCOPHAGE) 1000 MG tablet Take 1,000 mg by mouth 2 (two) times daily.  . nitroGLYCERIN (NITROSTAT) 0.4 MG SL tablet Place 0.4 mg under the tongue every 5 (five) minutes as needed for chest pain.  . pravastatin (PRAVACHOL) 40 MG tablet TAKE 1 TABLET BY MOUTH DAILY IN THE EVENING  . warfarin (COUMADIN) 5 MG tablet Take 5 mg by mouth daily.     Allergies:   Patient has no known allergies.   Social History   Tobacco Use  . Smoking status: Former Smoker    Packs/day: 1.00    Years: 40.00    Pack years: 40.00    Types: Cigarettes    Quit date: 09/26/1989    Years since quitting: 30.7  . Smokeless tobacco: Never Used  Substance Use Topics  . Alcohol use: Yes    Comment: 07/12/2012 "daquiri maybe 3X/yr"  . Drug use: No     Family Hx: The patient's family history includes Cancer in his father and mother.  ROS:    Please see the history of present illness.     All other systems reviewed and are negative.   Labs/Other Tests and Data Reviewed:    Recent Labs: 04/15/2020: ALT 21; BUN 9; Creatinine, Ser 0.94; Potassium 4.1; Sodium 131; TSH 1.355   Recent Lipid Panel Lab Results  Component Value Date/Time   CHOL 109 12/03/2018 07:53 AM   TRIG 85 12/03/2018 07:53 AM   HDL 41 12/03/2018 07:53 AM   CHOLHDL 2.7 12/03/2018 07:53 AM   CHOLHDL 2.9 02/22/2016 08:25 AM   LDLCALC 51 12/03/2018 07:53 AM    Wt Readings from Last 3 Encounters:  06/12/20 187 lb 12.8 oz (85.2 kg)  04/15/20 181 lb 12.8 oz (82.5 kg)  01/23/20 183 lb 12.8 oz (83.4 kg)  Objective:    Vital Signs:  BP (!) 152/82   Pulse 72   Ht 6' (1.829 m)   Wt 187 lb 12.8 oz (85.2 kg)   SpO2 98%   BMI 25.47 kg/m    GEN: Well nourished, well developed in no acute distress HEENT: Normal NECK: No JVD; No carotid bruits LYMPHATICS: No lymphadenopathy CARDIAC:irregularly irregular, no murmurs, rubs, gallops RESPIRATORY:  Clear to auscultation without rales, wheezing or rhonchi  ABDOMEN: Soft, non-tender, non-distended MUSCULOSKELETAL:  No edema; No deformity  SKIN: Warm and dry NEUROLOGIC:  Alert and oriented x 3 PSYCHIATRIC:  Normal affect   EKG was performed today and showed atrial flutter with CVR and variable block  ASSESSMENT & PLAN:    1.  ASCAD  - cath12/2016for CP showedpatent stents in the prox/mid LAD and mid LCx/OM1 with mild diffuse restenosis of the LAD/Diag,mild disease in the RCA.  -he denies any anginal symptoms -He is not on aspirin due to warfarin.   -Continue long acting nitrates and statin.   2.  HTN  -BP is borderline elevated on exam today but at home is controlled -continue on HCTZ 50 mg daily -creatinine stable at 0.94 in July 2021  3.  Persistent atrial fibrillation -he is now in atrial flutter and likely etiology of his fatigue -no bleeding issues with warfarin -continue  Amio to 100mg   daily  -TSH normal at 1.355 and ALT 21 in July 2021 -repeat PFTs with DLCO -his INR is followed by Dr. Arelia Sneddon and records have been obtained with following INRS: 06/03/2020 - 2.3 05/14/2020 - 2.9 04/16/2020 - 2.7 04/02/2020 - 2.5 03/26/2020 - 3.7 03/18/2020 - 2.9 -therefore will set up for DCCV next week  4.  Aortic stenosis  -mild AS by echo 04/2020 with mean AVG 11mmHg. -There was also mildly dilated aortic root at 37 mm.  -repeat echo 12/2020  5.  Hyperlipidemia  -LDL goal is less than 70.   -LDL was at goal at 51 12/2018 -Continue on Pravachol 40 mg daily. -check FLP and ALT   Medication Adjustments/Labs and Tests Ordered: Current medicines are reviewed at length with the patient today.  Concerns regarding medicines are outlined above.  Tests Ordered: Orders Placed This Encounter  Procedures  . ALT  . Lipid panel  . EKG 12-Lead  . Pulmonary function test   Medication Changes: No orders of the defined types were placed in this encounter.   Disposition:  Follow up 1 year with me  Signed, Fransico Him, MD  06/12/2020 10:53 AM    Riley Jordan

## 2020-06-12 ENCOUNTER — Ambulatory Visit (INDEPENDENT_AMBULATORY_CARE_PROVIDER_SITE_OTHER): Payer: Medicare Other | Admitting: Cardiology

## 2020-06-12 ENCOUNTER — Telehealth: Payer: Self-pay

## 2020-06-12 ENCOUNTER — Encounter: Payer: Self-pay | Admitting: Cardiology

## 2020-06-12 ENCOUNTER — Other Ambulatory Visit: Payer: Self-pay

## 2020-06-12 VITALS — BP 152/82 | HR 72 | Ht 72.0 in | Wt 187.8 lb

## 2020-06-12 DIAGNOSIS — I4819 Other persistent atrial fibrillation: Secondary | ICD-10-CM

## 2020-06-12 DIAGNOSIS — I350 Nonrheumatic aortic (valve) stenosis: Secondary | ICD-10-CM

## 2020-06-12 DIAGNOSIS — I2510 Atherosclerotic heart disease of native coronary artery without angina pectoris: Secondary | ICD-10-CM | POA: Diagnosis not present

## 2020-06-12 DIAGNOSIS — E785 Hyperlipidemia, unspecified: Secondary | ICD-10-CM

## 2020-06-12 DIAGNOSIS — I10 Essential (primary) hypertension: Secondary | ICD-10-CM

## 2020-06-12 NOTE — Patient Instructions (Addendum)
Medication Instructions:  Your physician recommends that you continue on your current medications as directed. Please refer to the Current Medication list given to you today.  *If you need a refill on your cardiac medications before your next appointment, please call your pharmacy*  Lab Work: Fasting lipids and ALT  If you have labs (blood work) drawn today and your tests are completely normal, you will receive your results only by: Marland Kitchen MyChart Message (if you have MyChart) OR . A paper copy in the mail If you have any lab test that is abnormal or we need to change your treatment, we will call you to review the results.   Testing/Procedures: Your physician has requested that you have an echocardiogram in March 2022. Echocardiography is a painless test that uses sound waves to create images of your heart. It provides your doctor with information about the size and shape of your heart and how well your heart's chambers and valves are working. This procedure takes approximately one hour. There are no restrictions for this procedure.  Your physician has recommended that you have a pulmonary function test. Pulmonary Function Tests are a group of tests that measure how well air moves in and out of your lungs.  Follow-Up: At [facility], you and your health needs are our priority.  As part of our continuing mission to provide you with exceptional heart care, we have created designated Provider Care Teams.  These Care Teams include your primary Cardiologist (physician) and Advanced Practice Providers (APPs -  Physician Assistants and Nurse Practitioners) who all work together to provide you with the care you need, when you need it.  Your next appointment:   1 year(s)  The format for your next appointment:   In Person  Provider:   You may see Fransico Him, MD or one of the following Advanced Practice Providers on your designated Care Team:    Melina Copa, PA-C  Ermalinda Barrios, PA-C

## 2020-06-12 NOTE — Telephone Encounter (Signed)
-----   Message from [name], MD sent at 06/12/2020  1:50 PM EDT ----- INRs have been therapeutic since June so ok to scheduled DCCV  Riley Jordan

## 2020-06-12 NOTE — Telephone Encounter (Signed)
Pt is returning call.  

## 2020-06-12 NOTE — Telephone Encounter (Signed)
Left message for patient to call back  

## 2020-06-12 NOTE — Telephone Encounter (Signed)
Spoke with the patient who states that he cant have cardioversion done until 9/17.  I have scheduled the patient and review the below instructions.    You are scheduled for a TEE on 9/17 with Dr. Harrington Challenger.  Please arrive at the [facility] (Main Entrance A) at [facility]: [address] [place], [place] [address] at 8:00am. (1 hour prior to procedure unless lab work is needed; if lab work is needed arrive 1.5 hours ahead)  DIET: Nothing to eat or drink after midnight except a sip of water with medications (see medication instructions below)  Hold: hydrochlorothiazide   Medication Instructions: Continue your anticoagulant: Coumadin You will need to continue your anticoagulant after your procedure until you  are told by your  Provider that it is safe to stop   Labs: [place] between the hours of 8:00 am and 4:30 pm on 9/14. You do not have to be fasting.  COVID Screening: 9/14  You must have a responsible person to drive you home and stay in the waiting area during your procedure. Failure to do so could result in cancellation.  Bring your insurance cards.  *Special Note: Every effort is made to have your procedure done on time. Occasionally there are emergencies that occur at the hospital that may cause delays. Please be patient if a delay does occur.

## 2020-06-13 ENCOUNTER — Other Ambulatory Visit: Payer: Self-pay | Admitting: Cardiology

## 2020-06-15 ENCOUNTER — Other Ambulatory Visit: Payer: Medicare Other

## 2020-06-15 ENCOUNTER — Telehealth: Payer: Self-pay | Admitting: Cardiology

## 2020-06-15 NOTE — Telephone Encounter (Signed)
Spoke with the patient who states that he was confused about his lab appointments that he saw in [place]. Advised patient that he is to come to [facility] office tomorrow for fasting lab work and afterwards he should go to the [place] screening site on [address]. Patient verbalized understanding.

## 2020-06-15 NOTE — Telephone Encounter (Signed)
Patient calling to go over cardioversion instructions again. He states his daughter read something different than what he was told. Please advise.

## 2020-06-16 ENCOUNTER — Other Ambulatory Visit (HOSPITAL_COMMUNITY)
Admission: RE | Admit: 2020-06-16 | Discharge: 2020-06-16 | Disposition: A | Discharge status: Home / Self Care | Payer: Medicare Other | Source: Ambulatory Visit | Attending: Internal Medicine | Admitting: Internal Medicine

## 2020-06-16 ENCOUNTER — Other Ambulatory Visit: Payer: Medicare Other

## 2020-06-16 ENCOUNTER — Other Ambulatory Visit: Payer: Self-pay

## 2020-06-16 ENCOUNTER — Telehealth: Payer: Self-pay | Admitting: Cardiology

## 2020-06-16 DIAGNOSIS — Z20822 Contact with and (suspected) exposure to covid-19: Secondary | ICD-10-CM | POA: Diagnosis not present

## 2020-06-16 DIAGNOSIS — Z01812 Encounter for preprocedural laboratory examination: Secondary | ICD-10-CM | POA: Diagnosis present

## 2020-06-16 DIAGNOSIS — I4819 Other persistent atrial fibrillation: Secondary | ICD-10-CM

## 2020-06-16 DIAGNOSIS — E785 Hyperlipidemia, unspecified: Secondary | ICD-10-CM

## 2020-06-16 LAB — CBC
Hematocrit: 38.3 % (ref 37.5–51.0)
Hemoglobin: 13 g/dL (ref 13.0–17.7)
MCH: 30.7 pg (ref 26.6–33.0)
MCHC: 33.9 g/dL (ref 31.5–35.7)
MCV: 90 fL (ref 79–97)
Platelets: 141 10*3/uL — ABNORMAL LOW (ref 150–450)
RBC: 4.24 x10E6/uL (ref 4.14–5.80)
RDW: 13 % (ref 11.6–15.4)
WBC: 4.5 10*3/uL (ref 3.4–10.8)

## 2020-06-16 LAB — BASIC METABOLIC PANEL
BUN/Creatinine Ratio: 15 (ref 10–24)
BUN: 14 mg/dL (ref 8–27)
CO2: 25 mmol/L (ref 20–29)
Calcium: 9.2 mg/dL (ref 8.6–10.2)
Chloride: 93 mmol/L — ABNORMAL LOW (ref 96–106)
Creatinine, Ser: 0.94 mg/dL (ref 0.76–1.27)
GFR calc Af Amer: 88 mL/min/{1.73_m2} (ref >59)
GFR calc non Af Amer: 76 mL/min/{1.73_m2} (ref >59)
Glucose: 275 mg/dL — ABNORMAL HIGH (ref 65–99)
Potassium: 4.2 mmol/L (ref 3.5–5.2)
Sodium: 129 mmol/L — ABNORMAL LOW (ref 134–144)

## 2020-06-16 LAB — LIPID PANEL
Chol/HDL Ratio: 2.8 ratio (ref 0.0–5.0)
Cholesterol, Total: 117 mg/dL (ref 100–199)
HDL: 42 mg/dL (ref >39)
LDL Chol Calc (NIH): 57 mg/dL (ref 0–99)
Triglycerides: 92 mg/dL (ref 0–149)
VLDL Cholesterol Cal: 18 mg/dL (ref 5–40)

## 2020-06-16 LAB — SARS CORONAVIRUS 2 (TAT 6-24 HRS): SARS Coronavirus 2: NEGATIVE

## 2020-06-16 LAB — ALT: ALT: 15 IU/L (ref 0–44)

## 2020-06-16 NOTE — Telephone Encounter (Signed)
Patient states that he has a glucose monitoring strip on his arm and wanted to make sure it would not be a problem during his cardioversion. Advised him that it should not. Patient verbalized understanding.

## 2020-06-16 NOTE — Telephone Encounter (Signed)
Riley Jordan is calling requesting to speak with Riley Jordan is regards to his upcoming procedure. Please advise.

## 2020-06-17 ENCOUNTER — Telehealth: Payer: Self-pay

## 2020-06-17 DIAGNOSIS — I10 Essential (primary) hypertension: Secondary | ICD-10-CM

## 2020-06-17 MED ORDER — HYDROCHLOROTHIAZIDE 25 MG PO TABS: 25.0000 mg | ORAL_TABLET | Freq: Every day | ORAL | 3 refills | Status: AC

## 2020-06-17 NOTE — Telephone Encounter (Signed)
-----   Message from [name], MD sent at 06/16/2020  4:46 PM EDT ----- Na is trending down.  Decrease HCTZ to 25mg  daily and repeat BMET in 1 week.  Check BP daily for a week and call with results

## 2020-06-19 ENCOUNTER — Ambulatory Visit (HOSPITAL_COMMUNITY)
Admission: RE | Admit: 2020-06-19 | Discharge: 2020-06-19 | Disposition: A | Discharge status: Home / Self Care | Payer: Medicare Other | Attending: Internal Medicine | Admitting: Internal Medicine

## 2020-06-19 ENCOUNTER — Encounter (HOSPITAL_COMMUNITY): Payer: Self-pay | Admitting: Anesthesiology

## 2020-06-19 ENCOUNTER — Encounter (HOSPITAL_COMMUNITY)
Admission: RE | Disposition: A | Discharge status: Home / Self Care | Payer: Medicare Other | Source: Home / Self Care | Attending: Internal Medicine

## 2020-06-19 DIAGNOSIS — Z01818 Encounter for other preprocedural examination: Secondary | ICD-10-CM | POA: Diagnosis not present

## 2020-06-19 DIAGNOSIS — Z538 Procedure and treatment not carried out for other reasons: Secondary | ICD-10-CM | POA: Diagnosis not present

## 2020-06-19 SURGERY — CANCELLED PROCEDURE

## 2020-06-19 NOTE — Progress Notes (Signed)
Patient arrived in Endo. Placed on monitor in sinus rhythm   A 12 lead EKG was obtained to confirm.  Dr. Harrington Challenger notified.  Orders received to cancel procedure and discharge patient home.  Patient verbalized understanding and will follow up with the office.

## 2020-06-19 NOTE — Anesthesia Preprocedure Evaluation (Deleted)
Anesthesia Evaluation    Reviewed: Allergy & Precautions, Patient's Chart, lab work & pertinent test results  Airway        Dental   Pulmonary neg pulmonary ROS, former smoker,           Cardiovascular hypertension, Pt. on medications + CAD (PCI 2000, 2002. stents 2013), + Cardiac Stents, + Peripheral Vascular Disease and +CHF (grade 2 diastolic dysfunction)  + dysrhythmias (coumadin, amio) Atrial Fibrillation + Valvular Problems/Murmurs (mild AS) AS   Echo 12/2019: 1. Left ventricular ejection fraction, by estimation, is 60 to 65%. The  left ventricle has normal function. The left ventricle has no regional  wall motion abnormalities. There is mild left ventricular hypertrophy.  Left ventricular diastolic parameters  are consistent with Grade II diastolic dysfunction (pseudonormalization).  Elevated left atrial pressure.  2. Right ventricular systolic function is normal. The right ventricular  size is mildly enlarged. Tricuspid regurgitation signal is inadequate for  assessing PA pressure.  3. Left atrial size was mildly dilated.  4. Right atrial size was moderately dilated.  5. The mitral valve is abnormal. Moderate annular calcification. No  evidence of mitral valve regurgitation.  6. The aortic valve is abnormal. Severe calcifications. Aortic valve  regurgitation is not visualized. Mild aortic valve stenosis. Vmax 2.4 m/s,  MG 11 mmHg, AVA 1.6 cm^2, DI 0.4  7. Aortic dilatation noted. There is mild dilatation of the ascending  aorta measuring 37 mm.  8. The inferior vena cava is normal in size with greater than 50%  respiratory variability, suggesting right atrial pressure of 3 mmHg.    Neuro/Psych PSYCHIATRIC DISORDERS Depression negative neurological ROS     GI/Hepatic Neg liver ROS, GERD  Medicated and Controlled,  Endo/Other  diabetes, Poorly Controlled, Type 2, Insulin DependentHypothyroidism    Renal/GU negative Renal ROS  negative genitourinary   Musculoskeletal  (+) Arthritis , Osteoarthritis,    Abdominal   Peds  Hematology negative hematology ROS (+) hct 38.3, plt 141   Anesthesia Other Findings   Reproductive/Obstetrics negative OB ROS                             Anesthesia Physical Anesthesia Plan  ASA: III  Anesthesia Plan: General   Post-op Pain Management:    Induction: Intravenous  PONV Risk Score and Plan: 2 and Propofol infusion, TIVA and Treatment may vary due to age or medical condition  Airway Management Planned: Natural Airway and Mask  Additional Equipment: None  Intra-op Plan:   Post-operative Plan: Extubation in OR  Informed Consent:   Plan Discussed with:   Anesthesia Plan Comments:         Anesthesia Quick Evaluation

## 2020-06-26 ENCOUNTER — Other Ambulatory Visit: Payer: Self-pay

## 2020-06-26 ENCOUNTER — Other Ambulatory Visit: Payer: Medicare Other | Admitting: *Deleted

## 2020-06-26 DIAGNOSIS — I10 Essential (primary) hypertension: Secondary | ICD-10-CM

## 2020-06-27 LAB — BASIC METABOLIC PANEL
BUN/Creatinine Ratio: 14 (ref 10–24)
BUN: 13 mg/dL (ref 8–27)
CO2: 27 mmol/L (ref 20–29)
Calcium: 9.4 mg/dL (ref 8.6–10.2)
Chloride: 98 mmol/L (ref 96–106)
Creatinine, Ser: 0.93 mg/dL (ref 0.76–1.27)
GFR calc Af Amer: 89 mL/min/{1.73_m2} (ref >59)
GFR calc non Af Amer: 77 mL/min/{1.73_m2} (ref >59)
Glucose: 302 mg/dL — ABNORMAL HIGH (ref 65–99)
Potassium: 4.4 mmol/L (ref 3.5–5.2)
Sodium: 137 mmol/L (ref 134–144)

## 2020-07-07 ENCOUNTER — Other Ambulatory Visit: Payer: Self-pay

## 2020-07-07 ENCOUNTER — Inpatient Hospital Stay: Payer: Medicare Other | Attending: Oncology | Admitting: Oncology

## 2020-07-07 VITALS — BP 142/99 | HR 97 | Temp 97.1°F | Resp 18 | Wt 185.9 lb

## 2020-07-07 DIAGNOSIS — E119 Type 2 diabetes mellitus without complications: Secondary | ICD-10-CM | POA: Diagnosis not present

## 2020-07-07 DIAGNOSIS — E039 Hypothyroidism, unspecified: Secondary | ICD-10-CM | POA: Insufficient documentation

## 2020-07-07 DIAGNOSIS — I10 Essential (primary) hypertension: Secondary | ICD-10-CM | POA: Insufficient documentation

## 2020-07-07 DIAGNOSIS — D472 Monoclonal gammopathy: Secondary | ICD-10-CM | POA: Insufficient documentation

## 2020-07-07 DIAGNOSIS — I4891 Unspecified atrial fibrillation: Secondary | ICD-10-CM | POA: Diagnosis not present

## 2020-07-07 NOTE — Progress Notes (Signed)
Reason for the request:    Monoclonal gammopathy  HPI: I was asked by Dr. Arelia Sneddon to evaluate Mr. Riley Jordan for abnormal SPEP.  He is an 84 year old man with history of coronary artery disease, hypertension and aortic stenosis.  He had laboratory testing by Dr. Arelia Sneddon on September 3 of 2021.  His CBC showed a white cell count of 4.4, hemoglobin of 14 and a platelet count of 159 with normal differential.  Electrolytes all within normal range including BUN, creatinine, calcium with total protein of 7.0.  He has normal liver function test.  SPEP was obtained and did show an M spike of 0.3 g/dL.   Clinically, he reports no major complaints.  He does report some fatigue and some tiredness this occasionally.  He was diagnosed with atrial fibrillation and was scheduled for cardioversion but he reverted spontaneously without any intervention.  He denies any bone pain or pathological fractures.  He continues to live independently with his son and continues to attend to activities of daily living.  He does not report any headaches, blurry vision, syncope or seizures. Does not report any fevers, chills or sweats.  Does not report any cough, wheezing or hemoptysis.  Does not report any chest pain, palpitation, orthopnea or leg edema.  Does not report any nausea, vomiting or abdominal pain.  Does not report any constipation or diarrhea.  Does not report any skeletal complaints.    Does not report frequency, urgency or hematuria.  Does not report any skin rashes or lesions. Does not report any heat or cold intolerance.  Does not report any lymphadenopathy or petechiae.  Does not report any anxiety or depression.  Remaining review of systems is negative.    Past Medical History:  Diagnosis Date   Aortic stenosis 02/16/2016   mild by echo 2018   Arthritis    "back; hands" (08-06-12)   CAD (coronary atherosclerotic disease)    s/ PCI left circ/OM 09/1999, cutting balloon PCI mid LAD 08/2001, , chronically untreated  tortuous lateral marginal branch, DES to prox to mid LAD and DES to mid circ 10/13,  patent stents in the prox/mid LAD and mid LCx/OM1 with mild diffuse restenosis of the LAD/Diag, mild disease in the RCA with mild LV dysfunction with EF 45-50%   Depression    "wife died Feb 25, 2012" (Aug 06, 2012)   Diabetic peripheral angiopathy (HCC)    GERD (gastroesophageal reflux disease)    High cholesterol    intolerant to Lipitor/Crestor   History of blood transfusion 1990's   S/P back OR   Hypertension    Hypothyroidism    Persistent atrial fibrillation ([place])    Type II diabetes mellitus ([place])    Wears glasses   :  Past Surgical History:  Procedure Laterality Date   BACK SURGERY     BACK SURGERY     CARDIAC CATHETERIZATION N/A 09/16/2015   Procedure: Left Heart Cath and Coronary Angiography;  Surgeon: Burnell Blanks, MD;  Location: [facility];  Service: Cardiovascular;  Laterality: N/A;   CHOLECYSTECTOMY  2012   CORONARY ANGIOPLASTY WITH STENT PLACEMENT  2000; 08/06/2012   "3 + 2; total of 5" (August 06, 2012)   LUMBAR DISC SURGERY  1997   PERCUTANEOUS CORONARY STENT INTERVENTION (PCI-S) N/A 08-06-12   Procedure: PERCUTANEOUS CORONARY STENT INTERVENTION (PCI-S);  Surgeon: Jettie Booze, MD;  Location: [facility];  Service: Cardiovascular;  Laterality: N/A;   POSTERIOR FUSION LUMBAR SPINE  ~ 1998   TYMPANOSTOMY TUBE PLACEMENT    :  Current Outpatient Medications:  °•  acetaminophen (TYLENOL) 500 MG tablet, Take 500-1,000 mg by mouth See admin instructions. Take 1 tablet (500 mg) by mouth every morning and 2 tablets (1000 mg) at bedtime, Disp: , Rfl:  °•  amiodarone (PACERONE) 200 MG tablet, TAKE 1 TABLET BY MOUTH DAILY (Patient taking differently: Take 200 mg by mouth at bedtime. ), Disp: 90 tablet, Rfl: 2 °•  famotidine (PEPCID) 20 MG tablet, Take 40 mg by mouth at bedtime. , Disp: , Rfl:  °•  fluticasone (FLONASE) 50 MCG/ACT nasal spray, Place 1 spray into  both nostrils daily. , Disp: , Rfl:  °•  hydrochlorothiazide (HYDRODIURIL) 25 MG tablet, Take 1 tablet (25 mg total) by mouth daily., Disp: 90 tablet, Rfl: 3 °•  insulin regular (NOVOLIN R) 100 units/mL injection, Inject 3-8 Units into the skin 3 (three) times daily before meals. Inject 3 to 8 units into skin with meals per sliding scale, Disp: , Rfl:  °•  isosorbide mononitrate (IMDUR) 60 MG 24 hr tablet, TAKE 1 AND 1/2 TABLETS BY MOUTH AT BEDTIME (Patient taking differently: Take 90 mg by mouth at bedtime. ), Disp: 135 tablet, Rfl: 2 °•  LANTUS SOLOSTAR 100 UNIT/ML Solostar Pen, Inject 8-22 Units into the skin at bedtime. Take 22 units in the morning and 8 units at bedtime, Disp: , Rfl:  °•  levothyroxine (SYNTHROID, LEVOTHROID) 175 MCG tablet, Take 175 mcg by mouth daily before breakfast. , Disp: , Rfl:  °•  loratadine (CLARITIN) 10 MG tablet, Take 10 mg by mouth daily., Disp: , Rfl:  °•  meclizine (ANTIVERT) 25 MG tablet, Take 25 mg by mouth 3 (three) times daily. , Disp: , Rfl:  °•  metFORMIN (GLUCOPHAGE) 1000 MG tablet, Take 1,000 mg by mouth 2 (two) times daily., Disp: , Rfl:  °•  nitroGLYCERIN (NITROSTAT) 0.4 MG SL tablet, Place 0.4 mg under the tongue every 5 (five) minutes as needed for chest pain., Disp: , Rfl:  °•  pravastatin (PRAVACHOL) 40 MG tablet, TAKE 1 TABLET BY MOUTH DAILY IN THE EVENING (Patient taking differently: Take 40 mg by mouth at bedtime. ), Disp: 90 tablet, Rfl: 3 °•  Propylene Glycol (SYSTANE COMPLETE OP), Place 1 application into both eyes daily as needed (Dry eye)., Disp: , Rfl:  °•  vitamin B-12 (CYANOCOBALAMIN) 1000 MCG tablet, Take 1,000 mcg by mouth daily with supper., Disp: , Rfl:  °•  warfarin (COUMADIN) 5 MG tablet, Take 5 mg by mouth daily with supper. , Disp: , Rfl:  °No current facility-administered medications for this visit. ° °Facility-Administered Medications Ordered in Other Visits:  °•  aminophylline injection 75 mg, 75 mg, Intravenous, BID PRN, Nelson, Katarina H,  MD, 75 mg at 08/31/15 1005: ° °No Known Allergies: ° °Family History  °Problem Relation Age of Onset  °• Cancer Mother   °• Cancer Father   °: ° °Social History  ° °Socioeconomic History  °• Marital status: Widowed  °  Spouse name: Not on file  °• Number of children: Not on file  °• Years of education: Not on file  °• Highest education level: Not on file  °Occupational History  °• Not on file  °Tobacco Use  °• Smoking status: Former Smoker  °  Packs/day: 1.00  °  Years: 40.00  °  Pack years: 40.00  °  Types: Cigarettes  °  Quit date: 09/26/1989  °  Years since quitting: 30.8  °• Smokeless tobacco: Never Used  °Substance and Sexual Activity  °•   Alcohol use: Yes  °  Comment: 07/12/2012 "daquiri maybe 3X/yr"  °• Drug use: No  °• Sexual activity: Never  °Other Topics Concern  °• Not on file  °Social History Narrative  °• Not on file  ° °Social Determinants of Health  ° °Financial Resource Strain:   °• Difficulty of Paying Living Expenses: Not on file  °Food Insecurity:   °• Worried About Running Out of Food in the Last Year: Not on file  °• Ran Out of Food in the Last Year: Not on file  °Transportation Needs:   °• Lack of Transportation (Medical): Not on file  °• Lack of Transportation (Non-Medical): Not on file  °Physical Activity:   °• Days of Exercise per Week: Not on file  °• Minutes of Exercise per Session: Not on file  °Stress:   °• Feeling of Stress : Not on file  °Social Connections:   °• Frequency of Communication with Friends and Family: Not on file  °• Frequency of Social Gatherings with Friends and Family: Not on file  °• Attends Religious Services: Not on file  °• Active Member of Clubs or Organizations: Not on file  °• Attends Club or Organization Meetings: Not on file  °• Marital Status: Not on file  °Intimate Partner Violence:   °• Fear of Current or Ex-Partner: Not on file  °• Emotionally Abused: Not on file  °• Physically Abused: Not on file  °• Sexually Abused: Not on file  °: ° °Pertinent items are  noted in HPI. ° °Exam: °Blood pressure (!) 142/99, pulse 97, temperature (!) 97.1 °F (36.2 °C), temperature source Tympanic, resp. rate 18, weight 185 lb 14.4 oz (84.3 kg), SpO2 100 %. ° °ECOG 1 ° °General appearance: alert and cooperative appeared without distress. °Head: atraumatic without any abnormalities. °Eyes: conjunctivae/corneas clear. PERRL.  Sclera anicteric. °Throat: lips, mucosa, and tongue normal; without oral thrush or ulcers. °Resp: clear to auscultation bilaterally without rhonchi, wheezes or dullness to percussion. °Cardio: regular rate and rhythm, S1, S2 normal, no murmur, click, rub or gallop °GI: soft, non-tender; bowel sounds normal; no masses,  no organomegaly °Skin: Skin color, texture, turgor normal. No rashes or lesions °Lymph nodes: Cervical, supraclavicular, and axillary nodes normal. °Neurologic: Grossly normal without any motor, sensory or deep tendon reflexes. °Musculoskeletal: No joint deformity or effusion. ° ° ° °Assessment and Plan:  ° °83-year-old with: ° °1.  Abnormal serum protein electrophoresis after detecting an M spike of 0.3 g/dL.  This is in the setting of a normal CBC, electrolytes, kidney function and calcium. ° °The differential diagnosis was discussed today.  Reactive findings related to autoimmune disease, plasma cell disorder including MGUS versus other considerations.  Amyloidosis and multiple myeloma are considered unlikely.  He has no evidence to suggest endorgan damage or symptomatic plasma cell disorder.  He has normal electrolytes and CBC. ° °From a management standpoint, I recommended observation and surveillance for the time being.  We will repeat his protein studies including serum protein electrophoresis, immunofixation, serum light chains in 6 months.  Bone marrow biopsy and skeletal survey may be needed at some point pending these results. ° °2.  Follow-up: Will be in 6 months for repeat evaluation. ° °30  minutes were dedicated to this visit. The time  was spent on reviewing laboratory data, discussing treatment options, discussing differential diagnosis and answering questions regarding future plan. ° ° ° ° °  A copy of this consult has been forwarded to the requesting physician. ° °

## 2020-07-08 ENCOUNTER — Ambulatory Visit (INDEPENDENT_AMBULATORY_CARE_PROVIDER_SITE_OTHER): Payer: Medicare Other | Admitting: Internal Medicine

## 2020-07-08 DIAGNOSIS — I4819 Other persistent atrial fibrillation: Secondary | ICD-10-CM | POA: Diagnosis not present

## 2020-07-08 LAB — PULMONARY FUNCTION TEST
DL/VA % pred: 125 %
DL/VA: 4.83 ml/min/mmHg/L
DLCO cor % pred: 114 %
DLCO cor: 28.7 ml/min/mmHg
DLCO unc % pred: 109 %
DLCO unc: 27.32 ml/min/mmHg
FEF 25-75 Post: 3.28 L/sec
FEF 25-75 Pre: 2.18 L/sec
FEF2575-%Change-Post: 50 %
FEF2575-%Pred-Post: 162 %
FEF2575-%Pred-Pre: 107 %
FEV1-%Change-Post: 11 %
FEV1-%Pred-Post: 88 %
FEV1-%Pred-Pre: 79 %
FEV1-Post: 2.63 L
FEV1-Pre: 2.35 L
FEV1FVC-%Change-Post: -1 %
FEV1FVC-%Pred-Pre: 109 %
FEV6-%Change-Post: 12 %
FEV6-%Pred-Post: 87 %
FEV6-%Pred-Pre: 77 %
FEV6-Post: 3.39 L
FEV6-Pre: 3.01 L
FEV6FVC-%Change-Post: 0 %
FEV6FVC-%Pred-Post: 106 %
FEV6FVC-%Pred-Pre: 107 %
FVC-%Change-Post: 12 %
FVC-%Pred-Post: 81 %
FVC-%Pred-Pre: 72 %
FVC-Post: 3.4 L
FVC-Pre: 3.01 L
Post FEV1/FVC ratio: 77 %
Post FEV6/FVC ratio: 100 %
Pre FEV1/FVC ratio: 78 %
Pre FEV6/FVC Ratio: 100 %
RV % pred: 157 %
RV: 4.31 L
TLC % pred: 104 %
TLC: 7.6 L

## 2020-07-08 NOTE — Progress Notes (Signed)
Full PFT performed today. °

## 2020-07-17 ENCOUNTER — Telehealth: Payer: Self-pay | Admitting: *Deleted

## 2020-07-17 MED ORDER — SPIRIVA HANDIHALER 18 MCG IN CAPS: 18.0000 ug | ORAL_CAPSULE | Freq: Every day | RESPIRATORY_TRACT | 12 refills | Status: AC

## 2020-07-17 NOTE — Telephone Encounter (Signed)
-----   Message from Dorothy Spark, MD sent at 07/09/2020 11:20 PM EDT ----- Minimal Restriction Slight response to bronchodilator,  start Spiriva inhaler 18 mch daily

## 2020-07-17 NOTE — Telephone Encounter (Signed)
Patient notified. Prescription sent to [facility].  Patient will let us know if not covered by his insurance and is too expensive

## 2020-07-21 ENCOUNTER — Other Ambulatory Visit: Payer: Self-pay | Admitting: Cardiology

## 2020-07-22 NOTE — Telephone Encounter (Signed)
Pt called in and stated that this inhaler is going to need a PA completed on it because it is going to be $300 or $400 and he can not afford that.    He would like a PA completed on this or something else called in  Best number [phone]

## 2020-07-22 NOTE — Telephone Encounter (Signed)
[name], MD  Via, Deliah Boston, LPN; Antonieta Iba, RN I would prefer that we not order Spiriva and get him in to see his PCP to review PFTs and determine if treatment is necessary   I have faxed over PFT results to Dr. Arelia Sneddon office and spoke with them. They will call the patient to set up an appointment to review results and determine whether an inhaler is needed.  Spoke with the patient and he is aware.

## 2020-08-21 ENCOUNTER — Other Ambulatory Visit: Payer: Self-pay | Admitting: Internal Medicine

## 2020-08-21 DIAGNOSIS — I480 Paroxysmal atrial fibrillation: Secondary | ICD-10-CM

## 2020-12-08 ENCOUNTER — Other Ambulatory Visit: Payer: Self-pay

## 2020-12-08 ENCOUNTER — Encounter: Payer: Self-pay | Admitting: Cardiology

## 2020-12-08 ENCOUNTER — Ambulatory Visit (HOSPITAL_COMMUNITY): Payer: Medicare Other | Attending: Cardiovascular Disease

## 2020-12-08 DIAGNOSIS — I350 Nonrheumatic aortic (valve) stenosis: Secondary | ICD-10-CM

## 2020-12-08 LAB — ECHOCARDIOGRAM COMPLETE
AR max vel: 1.5 cm2
AV Area VTI: 1.53 cm2
AV Area mean vel: 1.47 cm2
AV Mean grad: 16 mmHg
AV Peak grad: 24.2 mmHg
Ao pk vel: 2.46 m/s
Area-P 1/2: 1.87 cm2
S' Lateral: 3.1 cm

## 2020-12-09 ENCOUNTER — Telehealth: Payer: Self-pay

## 2020-12-09 DIAGNOSIS — I350 Nonrheumatic aortic (valve) stenosis: Secondary | ICD-10-CM

## 2020-12-09 NOTE — Telephone Encounter (Signed)
-----   Message from [name], MD sent at 12/08/2020  7:11 PM EST ----- Echo showed normal heart function and mild AS - repeat echo in 1 year

## 2020-12-09 NOTE — Telephone Encounter (Signed)
Patient notified via [place]. Needs echocardiogram in one year.

## 2020-12-15 ENCOUNTER — Encounter (HOSPITAL_COMMUNITY): Payer: Self-pay | Admitting: Nurse Practitioner

## 2020-12-15 ENCOUNTER — Ambulatory Visit (HOSPITAL_COMMUNITY)
Admission: RE | Admit: 2020-12-15 | Discharge: 2020-12-15 | Disposition: A | Discharge status: Home / Self Care | Payer: Medicare Other | Source: Ambulatory Visit | Attending: Nurse Practitioner | Admitting: Nurse Practitioner

## 2020-12-15 ENCOUNTER — Other Ambulatory Visit: Payer: Self-pay

## 2020-12-15 VITALS — BP 150/90 | HR 88 | Ht 72.0 in | Wt 182.2 lb

## 2020-12-15 DIAGNOSIS — I480 Paroxysmal atrial fibrillation: Secondary | ICD-10-CM | POA: Diagnosis present

## 2020-12-15 DIAGNOSIS — Z794 Long term (current) use of insulin: Secondary | ICD-10-CM | POA: Insufficient documentation

## 2020-12-15 DIAGNOSIS — Z79890 Hormone replacement therapy (postmenopausal): Secondary | ICD-10-CM | POA: Insufficient documentation

## 2020-12-15 DIAGNOSIS — Z95820 Peripheral vascular angioplasty status with implants and grafts: Secondary | ICD-10-CM | POA: Insufficient documentation

## 2020-12-15 DIAGNOSIS — E1151 Type 2 diabetes mellitus with diabetic peripheral angiopathy without gangrene: Secondary | ICD-10-CM | POA: Diagnosis not present

## 2020-12-15 DIAGNOSIS — D6869 Other thrombophilia: Secondary | ICD-10-CM | POA: Diagnosis not present

## 2020-12-15 DIAGNOSIS — I2510 Atherosclerotic heart disease of native coronary artery without angina pectoris: Secondary | ICD-10-CM | POA: Insufficient documentation

## 2020-12-15 DIAGNOSIS — E119 Type 2 diabetes mellitus without complications: Secondary | ICD-10-CM | POA: Diagnosis not present

## 2020-12-15 DIAGNOSIS — Z7984 Long term (current) use of oral hypoglycemic drugs: Secondary | ICD-10-CM | POA: Diagnosis not present

## 2020-12-15 DIAGNOSIS — Z79899 Other long term (current) drug therapy: Secondary | ICD-10-CM | POA: Diagnosis not present

## 2020-12-15 DIAGNOSIS — I10 Essential (primary) hypertension: Secondary | ICD-10-CM | POA: Insufficient documentation

## 2020-12-15 DIAGNOSIS — I350 Nonrheumatic aortic (valve) stenosis: Secondary | ICD-10-CM | POA: Insufficient documentation

## 2020-12-15 DIAGNOSIS — Z87891 Personal history of nicotine dependence: Secondary | ICD-10-CM | POA: Insufficient documentation

## 2020-12-15 DIAGNOSIS — Z955 Presence of coronary angioplasty implant and graft: Secondary | ICD-10-CM | POA: Insufficient documentation

## 2020-12-15 DIAGNOSIS — Z7901 Long term (current) use of anticoagulants: Secondary | ICD-10-CM | POA: Diagnosis not present

## 2020-12-15 LAB — COMPREHENSIVE METABOLIC PANEL
ALT: 23 U/L (ref 0–44)
AST: 28 U/L (ref 15–41)
Albumin: 3.8 g/dL (ref 3.5–5.0)
Alkaline Phosphatase: 57 U/L (ref 38–126)
Anion gap: 7 (ref 5–15)
BUN: 15 mg/dL (ref 8–23)
CO2: 28 mmol/L (ref 22–32)
Calcium: 9.5 mg/dL (ref 8.9–10.3)
Chloride: 99 mmol/L (ref 98–111)
Creatinine, Ser: 1.09 mg/dL (ref 0.61–1.24)
GFR, Estimated: >60 mL/min (ref >60)
Glucose, Bld: 146 mg/dL — ABNORMAL HIGH (ref 70–99)
Potassium: 4.4 mmol/L (ref 3.5–5.1)
Sodium: 134 mmol/L — ABNORMAL LOW (ref 135–145)
Total Bilirubin: 1.2 mg/dL (ref 0.3–1.2)
Total Protein: 6.6 g/dL (ref 6.5–8.1)

## 2020-12-15 LAB — TSH: TSH: 2.083 u[IU]/mL (ref 0.350–4.500)

## 2020-12-15 NOTE — Progress Notes (Signed)
Primary Care Physician: Riley Downing, MD Referring Physician:Dr. [name] is a 85 y.o. male with a h/o CAD, paroxysmal afib, aortic stenosis, DM, that was seen 9/9 for f/u with Dr. Radford Jordan. He c/o of fatigue and noticing HR's in the 40-50's. Amiodarone was reduced to 100 mg daily and 2 week ZIO patch was placed. It was turned in last week and results are not back. He saw his PCP on  Monday, after seeing [name] on Sunday in the 110's. He was in afib and amiodarone was increased back to 200 mg a day. Now his HR's are back in the 60/70's at home and pt thought he was back in rhythm as he has felt well. EKG confirms afib with CVR at 75 bpm.   F/u in afib clinic, 10/26. The Holter monitor that pt wore 9/18 through 10/2 report was reviewed as interpreted by Dr. Radford Jordan and showed 15% afib burden. He is in the clinic today with atrial flutter with rate controlled. He is still unsure if he is paroxysmal or persistent . He continues on 200 mg amiodarone a day.He states some days he feels better with HR's in the  60's other days he is more bothered with exertional dyspnea and fatigue. He has been getting weekly INR's to prepare for a DCCV if needed. We have not been receiving these from  his PCP but will request again. He was sub therapeutic at 1.8 2 weeks ago.    F/u afib clinic,  04/15/20. He is staying in SR on amiodarone. He has had recent ssues with vertigo and had antivert and Epley maneuvers with some improvement. CHA2DS2VASc score of at least 5, continues on coumadin.   F/u in afib clinic 12/15/20. ON visit with Dr. Radford Jordan in September 2021. He was found to be in rhythm but was in SR at time of cardioversion so procedure was cancelled.Today,  ekg shows atrial flutter with variable AV block. He is unaware, he thought he was in rhythm.   He is being compliant with amiodarone 200 mg daily. Continues on warfarin with a CHA2DS2VASc score of at least 5.    Today, he denies symptoms  of palpitations, chest pain, shortness of breath, orthopnea, PND, lower extremity edema, dizziness, presyncope, syncope, or neurologic sequela. The patient is tolerating medications without difficulties and is otherwise without complaint today.   Past Medical History:  Diagnosis Date  . Aortic stenosis 02/16/2016   mild by echo 12/2020 with mean AVG 22mmHg  . Arthritis    "back; hands" (07/13/2012)  . CAD (coronary atherosclerotic disease)    s/ PCI left circ/OM 09/1999, cutting balloon PCI mid LAD 08/2001, , chronically untreated tortuous lateral marginal branch, DES to prox to mid LAD and DES to mid circ 10/13,  patent stents in the prox/mid LAD and mid LCx/OM1 with mild diffuse restenosis of the LAD/Diag, mild disease in the RCA with mild LV dysfunction with EF 45-50%  . Depression    "wife died 01/23/12" (Jul 13, 2012)  . Diabetic peripheral angiopathy ([place])   . GERD (gastroesophageal reflux disease)   . High cholesterol    intolerant to Lipitor/Crestor  . History of blood transfusion 1990's   S/P back OR  . Hypertension   . Hypothyroidism   . Persistent atrial fibrillation ([place])   . Type II diabetes mellitus ([place])   . Wears glasses    Past Surgical History:  Procedure Laterality Date  . BACK SURGERY    . BACK SURGERY    .  CARDIAC CATHETERIZATION N/A 09/16/2015   Procedure: Left Heart Cath and Coronary Angiography;  Surgeon: Burnell Blanks, MD;  Location: [facility];  Service: Cardiovascular;  Laterality: N/A;  . CHOLECYSTECTOMY  2012  . CORONARY ANGIOPLASTY WITH STENT PLACEMENT  2000; 07/12/2012   "3 + 2; total of 5" (07/12/2012)  . [facility] SURGERY  1997  . PERCUTANEOUS CORONARY STENT INTERVENTION (PCI-S) N/A 07/12/2012   Procedure: PERCUTANEOUS CORONARY STENT INTERVENTION (PCI-S);  Surgeon: Jettie Booze, MD;  Location: [facility] CATH LAB;  Service: Cardiovascular;  Laterality: N/A;  . POSTERIOR FUSION LUMBAR SPINE  ~ 1998  . TYMPANOSTOMY TUBE PLACEMENT       Current Outpatient Medications  Medication Sig Dispense Refill  . acetaminophen (TYLENOL) 500 MG tablet Take 500-1,000 mg by mouth See admin instructions. Take 1 tablet (500 mg) by mouth every morning and 2 tablets (1000 mg) at bedtime    . amiodarone (PACERONE) 200 MG tablet TAKE 1 TABLET BY MOUTH DAILY 90 tablet 1  . fluticasone (FLONASE) 50 MCG/ACT nasal spray Place 1 spray into both nostrils daily.     . hydrochlorothiazide (HYDRODIURIL) 25 MG tablet Take 1 tablet (25 mg total) by mouth daily. 90 tablet 3  . insulin regular (NOVOLIN R) 100 units/mL injection Inject 3-8 Units into the skin 3 (three) times daily before meals. Inject 3 to 8 units into skin with meals per sliding scale    . isosorbide mononitrate (IMDUR) 60 MG 24 hr tablet TAKE 1 AND 1/2 TABLETS BY MOUTH AT BEDTIME 135 tablet 2  . LANTUS SOLOSTAR 100 UNIT/ML Solostar Pen Inject 8-22 Units into the skin at bedtime. Take 22 units in the morning and 8 units at bedtime    . levothyroxine (SYNTHROID, LEVOTHROID) 175 MCG tablet Take 175 mcg by mouth daily before breakfast.     . loratadine (CLARITIN) 10 MG tablet Take 10 mg by mouth daily.    . meclizine (ANTIVERT) 25 MG tablet Take 25 mg by mouth 3 (three) times daily.     . metFORMIN (GLUCOPHAGE) 1000 MG tablet Take 1,000 mg by mouth 2 (two) times daily.    . nitroGLYCERIN (NITROSTAT) 0.4 MG SL tablet Place 0.4 mg under the tongue every 5 (five) minutes as needed for chest pain.    . pravastatin (PRAVACHOL) 40 MG tablet TAKE 1 TABLET BY MOUTH DAILY IN THE EVENING 90 tablet 3  . Propylene Glycol (SYSTANE COMPLETE OP) Place 1 application into both eyes daily as needed (Dry eye).    Marland Kitchen tiotropium (SPIRIVA HANDIHALER) 18 MCG inhalation capsule Place 1 capsule (18 mcg total) into inhaler and inhale daily. 30 capsule 12  . vitamin B-12 (CYANOCOBALAMIN) 1000 MCG tablet Take 1,000 mcg by mouth daily with supper.    . warfarin (COUMADIN) 5 MG tablet Take 5 mg by mouth daily with supper.       No current facility-administered medications for this encounter.   Facility-Administered Medications Ordered in Other Encounters  Medication Dose Route Frequency Provider Last Rate Last Admin  . aminophylline injection 75 mg  75 mg Intravenous BID PRN Dorothy Spark, MD   75 mg at 08/31/15 1005    No Known Allergies  Social History   Socioeconomic History  . Marital status: Widowed    Spouse name: Not on file  . Number of children: Not on file  . Years of education: Not on file  . Highest education level: Not on file  Occupational History  . Not on file  Tobacco Use  . Smoking status: Former Smoker    Packs/day: 1.00    Years: 40.00    Pack years: 40.00    Types: Cigarettes    Quit date: 09/26/1989    Years since quitting: 31.2  . Smokeless tobacco: Never Used  Substance and Sexual Activity  . Alcohol use: Yes    Comment: 07/12/2012 "daquiri maybe 3X/yr"  . Drug use: No  . Sexual activity: Never  Other Topics Concern  . Not on file  Social History Narrative  . Not on file   Social Determinants of Health   Financial Resource Strain: Not on file  Food Insecurity: Not on file  Transportation Needs: Not on file  Physical Activity: Not on file  Stress: Not on file  Social Connections: Not on file  Intimate Partner Violence: Not on file    Family History  Problem Relation Age of Onset  . Cancer Mother   . Cancer Father     ROS- All systems are reviewed and negative except as per the HPI above  Physical Exam: There were no vitals filed for this visit. Wt Readings from Last 3 Encounters:  07/07/20 84.3 kg  06/12/20 85.2 kg  04/15/20 82.5 kg    Labs: Lab Results  Component Value Date   NA 137 06/26/2020   K 4.4 06/26/2020   CL 98 06/26/2020   CO2 27 06/26/2020   GLUCOSE 302 (H) 06/26/2020   BUN 13 06/26/2020   CREATININE 0.93 06/26/2020   CALCIUM 9.4 06/26/2020   MG 2.0 03/13/2011   Lab Results  Component Value Date   INR 1.8 (A)  07/15/2019   Lab Results  Component Value Date   CHOL 117 06/16/2020   HDL 42 06/16/2020   LDLCALC 57 06/16/2020   TRIG 92 06/16/2020     GEN- The patient is well appearing, alert and oriented x 3 today.   Head- normocephalic, atraumatic Eyes-  Sclera clear, conjunctiva pink Ears- hearing intact Oropharynx- clear Neck- supple, no JVP Lymph- no cervical lymphadenopathy Lungs- Clear to ausculation bilaterally, normal work of breathing Heart-irregular rate and rhythm, 2/6 systolic  murmur, rubs or gallops, PMI not laterally displaced GI- soft, NT, ND, + BS Extremities- no clubbing, cyanosis, or edema MS- no significant deformity or atrophy Skin- no rash or lesion Psych- euthymic mood, full affect Neuro- strength and sensation are intact  EKG-  atrial flutter at 88 bpm, qrs int 94 ms, qtc 436 ms   Epic results reviewed  Echo 12/2020-1. The aortic valve is tricuspid. There is moderate calcification of the  aortic valve. There is moderate thickening of the aortic valve. Aortic  valve regurgitation is not visualized. Mild aortic valve stenosis. Aortic  valve area, by VTI measures 1.53  cm. Aortic valve mean gradient measures 16.0 mmHg. Aortic valve Vmax  measures 2.46 m/s.  2. Left ventricular ejection fraction, by estimation, is 55 to 60%. The  left ventricle has normal function. The left ventricle has no regional  wall motion abnormalities. Left ventricular diastolic function could not  be evaluated.  3. Right ventricular systolic function is normal. The right ventricular  size is normal. There is normal pulmonary artery systolic pressure. The  estimated right ventricular systolic pressure is 94.8 mmHg.  4. The mitral valve is degenerative. Trivial mitral valve regurgitation.  No evidence of mitral stenosis.  5. The inferior vena cava is normal in size with greater than 50%  respiratory variability, suggesting right atrial pressure of 3 mmHg.  Comparison(s): No  significant change from prior study. AS remains mild.   FINDINGS  12/08/2020- 1. The aortic valve is tricuspid. There is moderate calcification of the  aortic valve. There is moderate thickening of the aortic valve. Aortic  valve regurgitation is not visualized. Mild aortic valve stenosis. Aortic  valve area, by VTI measures 1.53  cm. Aortic valve mean gradient measures 16.0 mmHg. Aortic valve Vmax  measures 2.46 m/s.  2. Left ventricular ejection fraction, by estimation, is 55 to 60%. The  left ventricle has normal function. The left ventricle has no regional  wall motion abnormalities. Left ventricular diastolic function could not  be evaluated.  3. Right ventricular systolic function is normal. The right ventricular  size is normal. There is normal pulmonary artery systolic pressure. The  estimated right ventricular systolic pressure is 75.1 mmHg.  4. The mitral valve is degenerative. Trivial mitral valve regurgitation.  No evidence of mitral stenosis.  5. The inferior vena cava is normal in size with greater than 50%  respiratory variability, suggesting right atrial pressure of 3 mmHg.   Comparison(s): No significant change from prior study. AS remains mild.   Assessment and Plan: 1. Afib/flutter  Was out of rhythm in September 2021 but in rhythm for cardioversion Out of rhythm today but pt unaware Will place a 2 week Zio patch to determine afib burden  Continue  warfarin for a CHA2DS2VASc score of 4  cmet/tsh today   2. HTN Stable   3. Aortic stenosis Mild by recent echo Will be rechecked in one year  I will be back in touch when I have monitor results  Riley Jordan, [facility] [address] [place], [place] [address] [phone]

## 2020-12-15 NOTE — Addendum Note (Signed)
Encounter addended by: Juluis Mire, RN on: 12/15/2020 11:50 AM  Actions taken: Order list changed, Diagnosis association updated

## 2020-12-19 ENCOUNTER — Emergency Department (HOSPITAL_COMMUNITY): Payer: Medicare Other

## 2020-12-19 ENCOUNTER — Encounter (HOSPITAL_COMMUNITY): Payer: Self-pay | Admitting: Emergency Medicine

## 2020-12-19 ENCOUNTER — Observation Stay (HOSPITAL_COMMUNITY)
Admission: EM | Admit: 2020-12-19 | Discharge: 2020-12-19 | Disposition: A | Discharge status: Home / Self Care | Payer: Medicare Other | Attending: Internal Medicine | Admitting: Internal Medicine

## 2020-12-19 DIAGNOSIS — R4182 Altered mental status, unspecified: Secondary | ICD-10-CM | POA: Diagnosis present

## 2020-12-19 DIAGNOSIS — E039 Hypothyroidism, unspecified: Secondary | ICD-10-CM | POA: Diagnosis not present

## 2020-12-19 DIAGNOSIS — I10 Essential (primary) hypertension: Secondary | ICD-10-CM | POA: Diagnosis not present

## 2020-12-19 DIAGNOSIS — Z7901 Long term (current) use of anticoagulants: Secondary | ICD-10-CM | POA: Diagnosis not present

## 2020-12-19 DIAGNOSIS — I2510 Atherosclerotic heart disease of native coronary artery without angina pectoris: Secondary | ICD-10-CM | POA: Insufficient documentation

## 2020-12-19 DIAGNOSIS — Z20822 Contact with and (suspected) exposure to covid-19: Secondary | ICD-10-CM | POA: Diagnosis not present

## 2020-12-19 DIAGNOSIS — Z794 Long term (current) use of insulin: Secondary | ICD-10-CM | POA: Insufficient documentation

## 2020-12-19 DIAGNOSIS — I6200 Nontraumatic subdural hemorrhage, unspecified: Secondary | ICD-10-CM | POA: Diagnosis not present

## 2020-12-19 DIAGNOSIS — S065X9A Traumatic subdural hemorrhage with loss of consciousness of unspecified duration, initial encounter: Secondary | ICD-10-CM

## 2020-12-19 DIAGNOSIS — E119 Type 2 diabetes mellitus without complications: Secondary | ICD-10-CM | POA: Diagnosis not present

## 2020-12-19 DIAGNOSIS — Z7984 Long term (current) use of oral hypoglycemic drugs: Secondary | ICD-10-CM | POA: Diagnosis not present

## 2020-12-19 DIAGNOSIS — Z79899 Other long term (current) drug therapy: Secondary | ICD-10-CM | POA: Diagnosis not present

## 2020-12-19 DIAGNOSIS — Z87891 Personal history of nicotine dependence: Secondary | ICD-10-CM | POA: Insufficient documentation

## 2020-12-19 DIAGNOSIS — Z515 Encounter for palliative care: Secondary | ICD-10-CM

## 2020-12-19 DIAGNOSIS — S065XAA Traumatic subdural hemorrhage with loss of consciousness status unknown, initial encounter: Secondary | ICD-10-CM | POA: Diagnosis present

## 2020-12-19 LAB — CBC WITH DIFFERENTIAL/PLATELET
Abs Immature Granulocytes: 0.16 10*3/uL — ABNORMAL HIGH (ref 0.00–0.07)
Basophils Absolute: 0 10*3/uL (ref 0.0–0.1)
Basophils Relative: 0 %
Eosinophils Absolute: 0 10*3/uL (ref 0.0–0.5)
Eosinophils Relative: 0 %
HCT: 42.2 % (ref 39.0–52.0)
Hemoglobin: 13.9 g/dL (ref 13.0–17.0)
Immature Granulocytes: 1 %
Lymphocytes Relative: 3 %
Lymphs Abs: 0.3 10*3/uL — ABNORMAL LOW (ref 0.7–4.0)
MCH: 29.5 pg (ref 26.0–34.0)
MCHC: 32.9 g/dL (ref 30.0–36.0)
MCV: 89.6 fL (ref 80.0–100.0)
Monocytes Absolute: 1.3 10*3/uL — ABNORMAL HIGH (ref 0.1–1.0)
Monocytes Relative: 10 %
Neutro Abs: 10.9 10*3/uL — ABNORMAL HIGH (ref 1.7–7.7)
Neutrophils Relative %: 86 %
Platelets: 193 10*3/uL (ref 150–400)
RBC: 4.71 MIL/uL (ref 4.22–5.81)
RDW: 13.3 % (ref 11.5–15.5)
WBC: 12.7 10*3/uL — ABNORMAL HIGH (ref 4.0–10.5)
nRBC: 0 % (ref 0.0–0.2)

## 2020-12-19 LAB — I-STAT ARTERIAL BLOOD GAS, ED
Acid-Base Excess: 1 mmol/L (ref 0.0–2.0)
Bicarbonate: 25.1 mmol/L (ref 20.0–28.0)
Calcium, Ion: 1.19 mmol/L (ref 1.15–1.40)
HCT: 37 % — ABNORMAL LOW (ref 39.0–52.0)
Hemoglobin: 12.6 g/dL — ABNORMAL LOW (ref 13.0–17.0)
O2 Saturation: 100 %
Patient temperature: 99.7
Potassium: 3.5 mmol/L (ref 3.5–5.1)
Sodium: 134 mmol/L — ABNORMAL LOW (ref 135–145)
TCO2: 26 mmol/L (ref 22–32)
pCO2 arterial: 39.9 mmHg (ref 32.0–48.0)
pH, Arterial: 7.409 (ref 7.350–7.450)
pO2, Arterial: 395 mmHg — ABNORMAL HIGH (ref 83.0–108.0)

## 2020-12-19 LAB — COMPREHENSIVE METABOLIC PANEL
ALT: 24 U/L (ref 0–44)
AST: 27 U/L (ref 15–41)
Albumin: 3.8 g/dL (ref 3.5–5.0)
Alkaline Phosphatase: 63 U/L (ref 38–126)
Anion gap: 16 — ABNORMAL HIGH (ref 5–15)
BUN: 13 mg/dL (ref 8–23)
CO2: 20 mmol/L — ABNORMAL LOW (ref 22–32)
Calcium: 9.1 mg/dL (ref 8.9–10.3)
Chloride: 94 mmol/L — ABNORMAL LOW (ref 98–111)
Creatinine, Ser: 1.3 mg/dL — ABNORMAL HIGH (ref 0.61–1.24)
GFR, Estimated: 55 mL/min — ABNORMAL LOW (ref >60)
Glucose, Bld: 393 mg/dL — ABNORMAL HIGH (ref 70–99)
Potassium: 3.7 mmol/L (ref 3.5–5.1)
Sodium: 130 mmol/L — ABNORMAL LOW (ref 135–145)
Total Bilirubin: 2.3 mg/dL — ABNORMAL HIGH (ref 0.3–1.2)
Total Protein: 7 g/dL (ref 6.5–8.1)

## 2020-12-19 LAB — URINALYSIS, ROUTINE W REFLEX MICROSCOPIC
Bacteria, UA: NONE SEEN
Bilirubin Urine: NEGATIVE
Glucose, UA: >=500 mg/dL — AB
Ketones, ur: 20 mg/dL — AB
Leukocytes,Ua: NEGATIVE
Nitrite: NEGATIVE
Protein, ur: 30 mg/dL — AB
RBC / HPF: >50 RBC/hpf — ABNORMAL HIGH (ref 0–5)
Specific Gravity, Urine: 1.007 (ref 1.005–1.030)
pH: 7 (ref 5.0–8.0)

## 2020-12-19 LAB — PROTIME-INR
INR: 2.5 — ABNORMAL HIGH (ref 0.8–1.2)
Prothrombin Time: 25.9 seconds — ABNORMAL HIGH (ref 11.4–15.2)

## 2020-12-19 LAB — RESP PANEL BY RT-PCR (FLU A&B, COVID) ARPGX2
Influenza A by PCR: NEGATIVE
Influenza B by PCR: NEGATIVE
SARS Coronavirus 2 by RT PCR: NEGATIVE

## 2020-12-19 LAB — APTT: aPTT: 34 seconds (ref 24–36)

## 2020-12-19 LAB — CBG MONITORING, ED: Glucose-Capillary: 400 mg/dL — ABNORMAL HIGH (ref 70–99)

## 2020-12-19 LAB — LACTIC ACID, PLASMA: Lactic Acid, Venous: 2.8 mmol/L (ref 0.5–1.9)

## 2020-12-19 MED ORDER — LACTATED RINGERS IV SOLN: INTRAVENOUS | Status: DC

## 2020-12-19 MED ORDER — LORAZEPAM 1 MG PO TABS: 1.0000 mg | ORAL_TABLET | ORAL | Status: DC | PRN

## 2020-12-19 MED ORDER — LORAZEPAM 2 MG/ML PO CONC: 1.0000 mg | ORAL | Status: DC | PRN

## 2020-12-19 MED ORDER — VANCOMYCIN HCL 1500 MG/300ML IV SOLN
1500.0000 mg | Freq: Once | INTRAVENOUS | Status: AC
  Administered 2020-12-19: 1500 mg via INTRAVENOUS
  Filled 2020-12-19: qty 300

## 2020-12-19 MED ORDER — PROPOFOL 1000 MG/100ML IV EMUL
INTRAVENOUS | Status: AC
  Filled 2020-12-19: qty 100

## 2020-12-19 MED ORDER — MORPHINE BOLUS VIA INFUSION
2.0000 mg | INTRAVENOUS | Status: DC | PRN
  Filled 2020-12-19: qty 2

## 2020-12-19 MED ORDER — MORPHINE 100MG IN NS 100ML (1MG/ML) PREMIX INFUSION
1.0000 mg/h | INTRAVENOUS | Status: DC
Start: 2020-12-19 — End: 2020-12-20
  Administered 2020-12-19: 1 mg/h via INTRAVENOUS
  Filled 2020-12-19: qty 100

## 2020-12-19 MED ORDER — SUCCINYLCHOLINE CHLORIDE 20 MG/ML IJ SOLN
INTRAMUSCULAR | Status: AC | PRN
  Administered 2020-12-19: 20 mg via INTRAVENOUS

## 2020-12-19 MED ORDER — ETOMIDATE 2 MG/ML IV SOLN
INTRAVENOUS | Status: AC | PRN
  Administered 2020-12-19: 10 mg via INTRAVENOUS

## 2020-12-19 MED ORDER — VANCOMYCIN HCL 1000 MG/200ML IV SOLN: 1000.0000 mg | Freq: Once | INTRAVENOUS | Status: DC

## 2020-12-19 MED ORDER — LACTATED RINGERS IV BOLUS (SEPSIS)
1000.0000 mL | Freq: Once | INTRAVENOUS | Status: AC
  Administered 2020-12-19: 1000 mL via INTRAVENOUS

## 2020-12-19 MED ORDER — METRONIDAZOLE IN NACL 5-0.79 MG/ML-% IV SOLN: 500.0000 mg | Freq: Once | INTRAVENOUS | Status: DC

## 2020-12-19 MED ORDER — VITAMIN K1 10 MG/ML IJ SOLN
10.0000 mg | INTRAVENOUS | Status: DC
  Filled 2020-12-19: qty 1

## 2020-12-19 MED ORDER — LACTATED RINGERS IV BOLUS (SEPSIS)
500.0000 mL | Freq: Once | INTRAVENOUS | Status: AC
  Administered 2020-12-19: 500 mL via INTRAVENOUS

## 2020-12-19 MED ORDER — VANCOMYCIN HCL 1500 MG/300ML IV SOLN: 1500.0000 mg | INTRAVENOUS | Status: DC

## 2020-12-19 MED ORDER — ONDANSETRON HCL 4 MG/2ML IJ SOLN: 4.0000 mg | Freq: Four times a day (QID) | INTRAMUSCULAR | Status: DC | PRN

## 2020-12-19 MED ORDER — LORAZEPAM 2 MG/ML IJ SOLN: 1.0000 mg | INTRAMUSCULAR | Status: DC | PRN

## 2020-12-19 MED ORDER — OCTREOTIDE ACETATE 100 MCG/ML IJ SOLN
100.0000 ug | Freq: Three times a day (TID) | INTRAMUSCULAR | Status: DC | PRN
  Filled 2020-12-19: qty 1

## 2020-12-19 MED ORDER — ONDANSETRON 4 MG PO TBDP: 4.0000 mg | ORAL_TABLET | Freq: Four times a day (QID) | ORAL | Status: DC | PRN

## 2020-12-19 MED ORDER — PROTHROMBIN COMPLEX CONC HUMAN 500 UNITS IV KIT
2100.0000 [IU] | PACK | Status: DC
  Filled 2020-12-19: qty 2100

## 2020-12-19 MED ORDER — SODIUM CHLORIDE 0.9 % IV SOLN
2.0000 g | Freq: Once | INTRAVENOUS | Status: AC
  Administered 2020-12-19: 2 g via INTRAVENOUS
  Filled 2020-12-19: qty 2

## 2020-12-19 MED ORDER — SODIUM CHLORIDE 0.9 % IV SOLN: 2.0000 g | Freq: Two times a day (BID) | INTRAVENOUS | Status: DC

## 2020-12-20 LAB — URINE CULTURE: Culture: NO GROWTH

## 2020-12-24 LAB — CULTURE, BLOOD (ROUTINE X 2)
Culture: NO GROWTH
Culture: NO GROWTH
Special Requests: ADEQUATE
Special Requests: ADEQUATE

## 2020-12-28 ENCOUNTER — Telehealth: Payer: Self-pay | Admitting: Internal Medicine

## 2020-12-28 NOTE — Telephone Encounter (Signed)
ATC daughter to let her know we got message and would forward it to Dr. Annamaria Boots. Left voicemail. Will route to Dr. Annamaria Boots as FYI only. Condolence card will be given to Dr. Annamaria Boots to mail family. Nothing further needed at this time.

## 2021-01-01 NOTE — Progress Notes (Signed)
Patient transported to CT and back to ED33. RN at bedside.

## 2021-01-01 NOTE — Progress Notes (Signed)
Pharmacy Antibiotic Note  Riley Jordan is a 85 y.o. male admitted on 12/29/20 after being found down unresponsive requiring intubation.   Pharmacy has been consulted for Cefepime and vancomycin dosing for infection of unknown source.  WBC 12.7, SCr wnl   Plan: -Vancomycin 1500 mg IV Q 24 hrs. Goal AUC 400-550. Expected AUC: 459 SCr used: 1.09 -Cefepime 2 gm IV Q 12 hours -Monitor CBC, renal fx, cultures and clinical progress -Vanc levels as indicated       Temp (24hrs), Avg:100.3 F (37.9 C), Min:100.3 F (37.9 C), Max:100.3 F (37.9 C)  Recent Labs  Lab 12/15/20 1130  CREATININE 1.09    Estimated Creatinine Clearance: 57.3 mL/min (by C-G formula based on SCr of 1.09 mg/dL).    No Known Allergies  Antimicrobials this admission: Cefepime 3/19 >>  Vancomycin 3/19 >>  Metronidazole 3/19 >>   Dose adjustments this admission:  Microbiology results: 3/19 BCx:  3/19 UCx:     Thank you for allowing pharmacy to be a part of this patient's care.  Albertina Parr, PharmD., BCPS, BCCCP Clinical Pharmacist Please refer to [facility] for unit-specific pharmacist

## 2021-01-01 NOTE — ED Notes (Signed)
CT 3 is ready, this RN is waiting on RT to transport pt to CT scan.

## 2021-01-01 NOTE — ED Provider Notes (Addendum)
[facility] EMERGENCY DEPARTMENT Provider Note   CSN: [id number] Arrival date & time: 01/07/2021  1039     History Chief Complaint  Patient presents with  . Unresponsive    Riley Jordan is a 85 y.o. male.  HPI     5 caveat secondary to severity of illness and inability to speak 85 year old man history of A. fib, coronary artery disease, aortic stenosis, hypertension, GERD, type 2 diabetes, presents today with complaints of altered mental status.  History is obtained via [facility].  They report they were called out to house with last known normal of 3 AM.  They report their history is from his son.  Patient with unresponsiveness on their arrival and in route.  Blood sugars noted to be high over 400 and patient reported to be febrile.  Past Medical History:  Diagnosis Date  . Aortic stenosis 02/16/2016   mild by echo 12/2020 with mean AVG 59mmHg  . Arthritis    "back; hands" (2012-07-31)  . CAD (coronary atherosclerotic disease)    s/ PCI left circ/OM 09/1999, cutting balloon PCI mid LAD 08/2001, , chronically untreated tortuous lateral marginal branch, DES to prox to mid LAD and DES to mid circ 10/13,  patent stents in the prox/mid LAD and mid LCx/OM1 with mild diffuse restenosis of the LAD/Diag, mild disease in the RCA with mild LV dysfunction with EF 45-50%  . Depression    "wife died 2012-02-10" (Jul 31, 2012)  . Diabetic peripheral angiopathy ([place])   . GERD (gastroesophageal reflux disease)   . High cholesterol    intolerant to Lipitor/Crestor  . History of blood transfusion 1990's   S/P back OR  . Hypertension   . Hypothyroidism   . Persistent atrial fibrillation ([place])   . Type II diabetes mellitus ([place])   . Wears glasses     Patient Active Problem List   Diagnosis Date Noted  . Heart murmur 04/20/2017  . Aortic stenosis 02/16/2016  . Hypertension   . Persistent atrial fibrillation ([place])   . Wears glasses   . CAD (coronary atherosclerotic  disease)   . Hypothyroidism   . GERD (gastroesophageal reflux disease)   . Dyslipidemia   . Type II diabetes mellitus ([place])   . History of blood transfusion   . Arthritis   . Depression   . Diabetic peripheral angiopathy [facility])     Past Surgical History:  Procedure Laterality Date  . BACK SURGERY    . BACK SURGERY    . CARDIAC CATHETERIZATION N/A 09/16/2015   Procedure: Left Heart Cath and Coronary Angiography;  Surgeon: Burnell Blanks, MD;  Location: [place] CV LAB;  Service: Cardiovascular;  Laterality: N/A;  . CHOLECYSTECTOMY  2012  . CORONARY ANGIOPLASTY WITH STENT PLACEMENT  2000; 07-31-2012   "3 + 2; total of 5" (2012-07-31)  . [place] SURGERY  1997  . PERCUTANEOUS CORONARY STENT INTERVENTION (PCI-S) N/A 07-31-12   Procedure: PERCUTANEOUS CORONARY STENT INTERVENTION (PCI-S);  Surgeon: Jettie Booze, MD;  Location: [facility] CATH LAB;  Service: Cardiovascular;  Laterality: N/A;  . POSTERIOR FUSION LUMBAR SPINE  ~ 1998  . TYMPANOSTOMY TUBE PLACEMENT         Family History  Problem Relation Age of Onset  . Cancer Mother   . Cancer Father     Social History   Tobacco Use  . Smoking status: Former Smoker    Packs/day: 1.00    Years: 40.00    Pack years: 40.00  Types: Cigarettes    Quit date: 09/26/1989    Years since quitting: 31.2  . Smokeless tobacco: Never Used  Substance Use Topics  . Alcohol use: Yes    Comment: 07/12/2012 "daquiri maybe 3X/yr"  . Drug use: No    Home Medications Prior to Admission medications   Medication Sig Start Date End Date Taking? Authorizing Provider  acetaminophen (TYLENOL) 500 MG tablet Take 500-1,000 mg by mouth See admin instructions. Take 1 tablet (500 mg) by mouth every morning and 2 tablets (1000 mg) at bedtime    [provider]  amiodarone (PACERONE) 200 MG tablet TAKE 1 TABLET BY MOUTH DAILY 08/24/20   Allred, Jeneen Rinks, MD  Continuous Blood Gluc Receiver (FREESTYLE LIBRE READER) DEVI See admin  instructions. 08/23/17   [provider]  Continuous Blood Gluc Sensor ([place]) MISC See admin instructions. 08/23/17   [provider]  fluticasone (FLONASE) 50 MCG/ACT nasal spray Place 1 spray into both nostrils daily.  01/14/19   [provider]  hydrochlorothiazide (HYDRODIURIL) 25 MG tablet Take 1 tablet (25 mg total) by mouth daily. 06/17/20   [name], MD  insulin regular (NOVOLIN R) 100 units/mL injection Inject 3-8 Units into the skin 3 (three) times daily before meals. Inject 3 to 8 units into skin with meals per sliding scale    [provider]  isosorbide mononitrate (IMDUR) 60 MG 24 hr tablet TAKE 1 AND 1/2 TABLETS BY MOUTH AT BEDTIME 07/21/20   Turner, Eber Hong, MD  LANTUS SOLOSTAR 100 UNIT/ML Solostar Pen Inject 8-22 Units into the skin at bedtime. Take 22 units in the morning and 8 units at bedtime 11/16/15   [provider]  levothyroxine (SYNTHROID, LEVOTHROID) 175 MCG tablet Take 175 mcg by mouth daily before breakfast.  05/01/18   [provider]  loratadine (CLARITIN) 10 MG tablet Take 10 mg by mouth daily.    [provider]  meclizine (ANTIVERT) 25 MG tablet Take 25 mg by mouth 3 (three) times daily.     [provider]  metFORMIN (GLUCOPHAGE) 1000 MG tablet Take 1,000 mg by mouth 2 (two) times daily. 01/20/16   [provider]  nitroGLYCERIN (NITROSTAT) 0.4 MG SL tablet Place 0.4 mg under the tongue every 5 (five) minutes as needed for chest pain.    [provider]  omeprazole (PRILOSEC) 40 MG capsule Take 40 mg by mouth daily. 12/11/20   [provider]  pravastatin (PRAVACHOL) 40 MG tablet TAKE 1 TABLET BY MOUTH DAILY IN THE EVENING 05/12/20   Turner, Eber Hong, MD  Propylene Glycol (SYSTANE COMPLETE OP) Place 1 application into both eyes daily as needed (Dry eye).    [provider]  tamsulosin (FLOMAX) 0.4 MG CAPS capsule Take 0.8 mg by mouth  daily. 12/04/20   [provider]  tiotropium (SPIRIVA HANDIHALER) 18 MCG inhalation capsule Place 1 capsule (18 mcg total) into inhaler and inhale daily. 07/17/20   [name], MD  triamcinolone cream (KENALOG) 0.5 % Apply topically. 12/08/20   [provider]  vitamin B-12 (CYANOCOBALAMIN) 1000 MCG tablet Take 1,000 mcg by mouth daily with supper.    [provider]  warfarin (COUMADIN) 5 MG tablet Take 5 mg by mouth daily with supper.     [provider]    Allergies    Patient has no known allergies.  Review of Systems   Review of Systems  Unable to perform ROS: Acuity of condition  Physical Exam Updated Vital Signs There were no vitals taken for this visit.  Physical Exam Vitals and nursing note reviewed.  Constitutional:      General: He is in acute distress.     Appearance: He is ill-appearing.     Comments: Patient unresponsive with pupils equal midline and nonreactive  HENT:     Head: Atraumatic.     Right Ear: External ear normal.     Left Ear: External ear normal.     Nose: Nose normal.     Mouth/Throat:     Mouth: Mucous membranes are dry.  Eyes:     Comments: Pupils are midsize and nonreactive  Cardiovascular:     Rate and Rhythm: Tachycardia present. Rhythm irregular.     Pulses: Normal pulses.  Pulmonary:     Effort: Respiratory distress present.  Abdominal:     Palpations: Abdomen is soft.     Comments: Midline lower abdominal mass  Musculoskeletal:        General: No deformity.     Cervical back: Normal range of motion.  Skin:    General: Skin is warm and dry.     Capillary Refill: Capillary refill takes less than 2 seconds.     Coloration: Skin is pale.  Neurological:     Comments: Patient unresponsive and not moving any extremities     ED Results / Procedures / Treatments   Labs (all labs ordered are listed, but only abnormal results are displayed) Labs Reviewed  CBG MONITORING, ED - Abnormal; Notable  for the following components:      Result Value   Glucose-Capillary 400 (*)    All other components within normal limits  CULTURE, BLOOD (ROUTINE X 2)  CULTURE, BLOOD (ROUTINE X 2)  RESP PANEL BY RT-PCR (FLU A&B, COVID) ARPGX2  URINE CULTURE  CBC  BLOOD GAS, ARTERIAL  RAPID URINE DRUG SCREEN, HOSP PERFORMED  LACTIC ACID, PLASMA  LACTIC ACID, PLASMA  COMPREHENSIVE METABOLIC PANEL  CBC WITH DIFFERENTIAL/PLATELET  PROTIME-INR  APTT  URINALYSIS, ROUTINE W REFLEX MICROSCOPIC  I-STAT ARTERIAL BLOOD GAS, ED    EKG None  Radiology No results found.  Procedures .Critical Care Performed by: Pattricia Boss, MD Authorized by: Pattricia Boss, MD   Critical care provider statement:    Critical care time (minutes):  75   Critical care was necessary to treat or prevent imminent or life-threatening deterioration of the following conditions:  CNS failure or compromise   Critical care was time spent personally by me on the following activities:  Discussions with consultants, evaluation of patient's response to treatment, examination of patient, ordering and performing treatments and interventions, ordering and review of laboratory studies, ordering and review of radiographic studies, pulse oximetry, re-evaluation of patient's condition, obtaining history from patient or surrogate and review of old charts     Medications Ordered in ED Medications  propofol (DIPRIVAN) 1000 MG/100ML infusion (has no administration in time range)  lactated ringers infusion (has no administration in time range)  lactated ringers bolus 1,000 mL (has no administration in time range)    And  lactated ringers bolus 1,000 mL (has no administration in time range)    And  lactated ringers bolus 500 mL (has no administration in time range)  ceFEPIme (MAXIPIME) 2 g in sodium chloride 0.9 % 100 mL IVPB (has no administration in time range)  metroNIDAZOLE (FLAGYL) IVPB 500 mg (has no administration in time range)   vancomycin (VANCOREADY) IVPB 1000 mg/200 mL (has no administration  in time range)    ED Course  I have reviewed the triage vital signs and the nursing notes.  Pertinent labs & imaging results that were available during my care of the patient were reviewed by me and considered in my medical decision making (see chart for details).  Clinical Course as of 01-02-21 1327  Sat 01/02/21 CT reviewed and large bleed on left Discussed with Dr. Christella Noa Awaiting radiologist reading Discussed with family  [DR]    Clinical Course User Index [DR] Pattricia Boss, MD   75:05 PM Systolic blood pressure 90 to prevent being decreased awaiting head CT MDM Rules/Calculators/A&P                          1:52 PM Extensive conversation with family and patient in is made comfort care Discussed with IM resident for palliative care consult Dr. Court Joy at bedside.  Monitors are off.  Plan moving patient to palliative care room out of the ED prior to extubation Final Clinical Impression(s) / ED Diagnoses Final diagnoses:  SDH (subdural hematoma) (HCC)  Chronic anticoagulation  Palliative care encounter    Rx / DC Orders ED Discharge Orders    None       Pattricia Boss, MD 01-02-2021 1354    Pattricia Boss, MD 01-02-2021 1435

## 2021-01-01 NOTE — Progress Notes (Signed)
Transport pt to 5N. Preformed terminal extubation per MD order.

## 2021-01-01 NOTE — Progress Notes (Signed)
Sepsis note: Noticed progress not suggesting moving to comfort care. Message sent to bedside Rn and MD asking if sepsis order can be DC.

## 2021-01-01 NOTE — ED Notes (Signed)
Called to give report to receiving RN, unavailable at this time. Will call back in 10 minutes.

## 2021-01-01 NOTE — Death Summary Note (Signed)
  Name: Riley Jordan MRN: [id number] DOB: [birth year] 85 y.o.  Date of Admission: January 10, 2021 10:39 AM Date of Discharge: 12/20/2020 Attending Physician: No att. providers found  Discharge Diagnosis: Active Problems:   Subdural hematoma ([place])  Cause of death: Subdural hematoma Time of death: 01-13-24  Disposition and follow-up:   Mr.Riley Jordan was discharged from [facility] in expired condition.    Hospital Course: Riley Jordan is an 85 year old man with past medical history significant for atrial fibrillation, coronary artery disease, aortic stenosis, hypertension, type 2 diabetes mellitus, and GERD who presented to [facility] on Jan 10, 2021 for evaluation of altered mental status. On arrival by EMS, patient was noted to be unresponsive following episode of vomiting with urinary incontinence. Patient was hyperglycemic to 453 and with atrial fibrillation. Patient was intubated on arrival to the ED and underwent head CT which revealed large subdural hematoma with midline shift. Patient's family requested palliative extubation and comfort measures only. Patient was transferred from ED to 5N. Patient underwent palliative extubation by respiratory therapist at 1730. Patient placed on morphine drip at 1mg /hr. Patient's time of death at 1825 with family at bedside.  Signed: Cato Mulligan, MD 12/20/2020, 3:30 PM

## 2021-01-01 NOTE — H&P (Addendum)
Date: January 07, 2021               Patient Name:  Riley Jordan MRN: [id number]  DOB: 1935-11-11 Age / Sex: 85 y.o., male   PCP: Leonard Downing, MD         Medical Service: Internal Medicine Teaching Service         Attending Physician: Dr. Aldine Contes, MD    First Contact: Dr. Foy Guadal[place], MD Pager: [phone]  Second Contact: Dr. Tamsen Snider, MD Pager: [phone]       After Hours (After 5p/  First Contact Pager: [phone]  weekends / holidays): Second Contact Pager: [phone]   Chief Complaint: Unresponsiveness  History of Present Illness: Riley Jordan is an 85 year old man with past medical history significant for atrial fibrillation, coronary artery disease, aortic stenosis, hypertension, type 2 diabetes mellitus, and GERD who presented to [facility] on 01-07-2021 for evaluation of altered mental status.  History obtained by chart review, family and discussion with ED personnel as patient is unresponsive: Patient had last known normal at 3AM this morning. Family called EMS after family member found patient to be unresponsive with recent vomiting, incontinence of urine, blood glucose of 453 and atrial fibrillation. Patient was intubated on arrival to the ED and underwent CT Head which revealed large subdural hematoma with midline shift. Patient's family elected to pursue palliative extubation and comfort measures only. IMTS called for admission.  Meds: Current Facility-Administered Medications on File Prior to Encounter  Medication Dose Route Frequency Provider Last Rate Last Admin  . aminophylline injection 75 mg  75 mg Intravenous BID PRN Dorothy Spark, MD   75 mg at 08/31/15 1005   Current Outpatient Medications on File Prior to Encounter  Medication Sig Dispense Refill  . hydrochlorothiazide (HYDRODIURIL) 25 MG tablet Take 1 tablet (25 mg total) by mouth daily. 90 tablet 3  . acetaminophen (TYLENOL) 500 MG tablet Take 500-1,000 mg by mouth See admin instructions. Take 1  tablet (500 mg) by mouth every morning and 2 tablets (1000 mg) at bedtime    . amiodarone (PACERONE) 200 MG tablet TAKE 1 TABLET BY MOUTH DAILY 90 tablet 1  . Continuous Blood Gluc Receiver (FREESTYLE LIBRE READER) DEVI See admin instructions.    . Continuous Blood Gluc Sensor ([place]) MISC See admin instructions.    . fluticasone (FLONASE) 50 MCG/ACT nasal spray Place 1 spray into both nostrils daily.     . insulin regular (NOVOLIN R) 100 units/mL injection Inject 3-8 Units into the skin 3 (three) times daily before meals. Inject 3 to 8 units into skin with meals per sliding scale    . isosorbide mononitrate (IMDUR) 60 MG 24 hr tablet TAKE 1 AND 1/2 TABLETS BY MOUTH AT BEDTIME 135 tablet 2  . LANTUS SOLOSTAR 100 UNIT/ML Solostar Pen Inject 8-22 Units into the skin at bedtime. Take 22 units in the morning and 8 units at bedtime    . levothyroxine (SYNTHROID, LEVOTHROID) 175 MCG tablet Take 175 mcg by mouth daily before breakfast.     . loratadine (CLARITIN) 10 MG tablet Take 10 mg by mouth daily.    . meclizine (ANTIVERT) 25 MG tablet Take 25 mg by mouth 3 (three) times daily.     . metFORMIN (GLUCOPHAGE) 1000 MG tablet Take 1,000 mg by mouth 2 (two) times daily.    . nitroGLYCERIN (NITROSTAT) 0.4 MG SL tablet Place 0.4 mg under the tongue every 5 (five) minutes as needed  for chest pain.    Marland Kitchen omeprazole (PRILOSEC) 40 MG capsule Take 40 mg by mouth daily.    . pravastatin (PRAVACHOL) 40 MG tablet TAKE 1 TABLET BY MOUTH DAILY IN THE EVENING 90 tablet 3  . Propylene Glycol (SYSTANE COMPLETE OP) Place 1 application into both eyes daily as needed (Dry eye).    . tamsulosin (FLOMAX) 0.4 MG CAPS capsule Take 0.8 mg by mouth daily.    Marland Kitchen tiotropium (SPIRIVA HANDIHALER) 18 MCG inhalation capsule Place 1 capsule (18 mcg total) into inhaler and inhale daily. 30 capsule 12  . triamcinolone cream (KENALOG) 0.5 % Apply topically.    . vitamin B-12 (CYANOCOBALAMIN) 1000 MCG tablet Take  1,000 mcg by mouth daily with supper.    . warfarin (COUMADIN) 5 MG tablet Take 5 mg by mouth daily with supper.      Allergies: Allergies as of January 17, 2021  . (No Known Allergies)   Past Medical History:  Diagnosis Date  . Aortic stenosis 02/16/2016   mild by echo 12/2020 with mean AVG 38mmHg  . Arthritis    "back; hands" (2012/08/10)  . CAD (coronary atherosclerotic disease)    s/ PCI left circ/OM 09/1999, cutting balloon PCI mid LAD 08/2001, , chronically untreated tortuous lateral marginal branch, DES to prox to mid LAD and DES to mid circ 10/13,  patent stents in the prox/mid LAD and mid LCx/OM1 with mild diffuse restenosis of the LAD/Diag, mild disease in the RCA with mild LV dysfunction with EF 45-50%  . Depression    "wife died 02-20-12" (2012/08/10)  . Diabetic peripheral angiopathy ([place])   . GERD (gastroesophageal reflux disease)   . High cholesterol    intolerant to Lipitor/Crestor  . History of blood transfusion 1990's   S/P back OR  . Hypertension   . Hypothyroidism   . Persistent atrial fibrillation ([place])   . Type II diabetes mellitus ([place])   . Wears glasses    Family History:  Family History  Problem Relation Age of Onset  . Cancer Mother   . Cancer Father    Social History:  Social History   Tobacco Use  . Smoking status: Former Smoker    Packs/day: 1.00    Years: 40.00    Pack years: 40.00    Types: Cigarettes    Quit date: 09/26/1989    Years since quitting: 31.2  . Smokeless tobacco: Never Used  Substance Use Topics  . Alcohol use: Yes    Comment: 08-10-2012 "daquiri maybe 3X/yr"  . Drug use: No   Review of Systems: A complete ROS was negative except as per HPI.  Physical Exam: Blood pressure 114/75, pulse 80, temperature 99 F (37.2 C), resp. rate 18, SpO2 98 %. Physical Exam Constitutional:      Appearance: He is normal weight.     Interventions: He is intubated.  HENT:     Head: Normocephalic and atraumatic.  Cardiovascular:     Rate  and Rhythm: Tachycardia present. Rhythm irregular.  Pulmonary:     Effort: He is intubated.     Breath sounds: Normal air entry.  Chest:     Chest wall: No mass or deformity.  Abdominal:     General: Abdomen is flat.     Palpations: Abdomen is soft.  Musculoskeletal:        General: No deformity or signs of injury.  Skin:    General: Skin is warm and dry.  Neurological:     Mental Status: He is unresponsive.  GCS: GCS eye subscore is 1. GCS verbal subscore is 1. GCS motor subscore is 1.    EKG: personally reviewed my interpretation is atrial flutter 2:1  CXR: personally reviewed my interpretation is endotracheal tube in place and nasogastric tube in good position  Assessment & Plan by Problem: Active Problems:   Subdural hematoma ([place])  Murlean Caller. Remer is an 85 year old man with past medical history significant for atrial fibrillation, coronary artery disease, aortic stenosis, hypertension, type 2 diabetes mellitus, and GERD who presented to [facility] on 2020/12/23 for evaluation of altered mental status.  #Subdural hematoma Patient presented for unresponsiveness, intubated on arrival and found to have large left subdural hematoma resulting in 2cm left-to-right midline shift. Results were discussed with family who elected for patient to pursue palliative extubation. Our team will arrange for patient to be transferred to [place] for palliative extubation. Patient has five family members with him who may join patient in hospital room.  -Transfer to 5N -Palliative extubation following transfer to 5N -Comfort measures once extubated:  -Ativan, morphine -No visitor restrictions -Anticipate in-hospital death in coming minutes to hours -RN may pronounce death  Dispo: Admit patient to Observation with expected length of stay less than 2 midnights.  Signed: Cato Mulligan, MD 2020/12/23, 2:58 PM  Pager: [phone] After 5pm on weekdays and 1pm on weekends: On Call pager: [phone]

## 2021-01-01 NOTE — ED Notes (Signed)
Pt's family at bedside decided for pt to go to comfort care, EDP Ray informed & came to bedside. RT has been called & come assist with palliative extubation.

## 2021-01-01 NOTE — Progress Notes (Signed)
elink monitoring sepsis 

## 2021-01-01 NOTE — Progress Notes (Signed)
Situation: Chaplain responding to verbal referral from physician for pt Riley Jordan as family had elected comfort care.  Background: Facts: Family shared that after consulting with the doctor, they had elected comfort care for Mr. Riley Jordan at this time. Mr. Riley Jordan did not appear able to communicate at this time. Family: Mr. Riley Jordan twin daughters Riley Jordan and Riley Jordan) and their husbands were present at the time of the visit. Feelings: Family seemed accepting at this time. One son-in-law described both "we've accepted it" and "we're kind of numb right now... we know there's more coming." Faith: Family shared that they and Riley Jordan identify as "[employer]ist." They shared that their minister had called earlier and offered prayer.  Actions & Assessments: Riley Jordan seems to be resting comfortably and family seems to be in a phase of acceptance and peace at this time, having discussed with one another their wishes and having had their minister call and offer spiritual support.  Chaplain offered compassionate presence and inquired about feelings/spirituality.  Recommendations: For Chaplains: Be ready for changing needs as family shifts from anticipatory grief to realized grief. For Staff: Chaplain remains available for follow-up spiritual/emotional support as needed.  Rev. Susanne Borders, MDiv      Dec 26, 2020 1800  Clinical Encounter Type  Visited With Patient and family together  Visit Type Initial;Spiritual support;Other (Comment) (Comfort Care)

## 2021-01-01 NOTE — ED Notes (Signed)
Family updated as to patient's status by EDP Ray, currently at bedside discussing plan of care.

## 2021-01-01 NOTE — Progress Notes (Signed)
Code Sepsis monitoring discontinued due to cancellation.  

## 2021-01-01 NOTE — ED Notes (Signed)
Patient transported to CT with RN and RT 

## 2021-01-01 NOTE — Progress Notes (Signed)
Patient admitted to 5N04 for comfort care measures post terminal extubation performed in room at bedside by RT as per MD orders.  Larna Daughters, RN also in attendance.  Immediately post extubation, patient exhibited shallow respirations with stridor.  Turned on side for comfort.  Patient not responsive to verbal or painful stimuli.  Pupils fixed.  No corneal reflex noted.  Orally suctioned for small amt of tan mucous.  Morphine gtt infusing at 1 mg/hr.  Bed placed in low position with brakes on.  Patient considered low fall risk at this time due to unresponsiveness.  Family is present at bedside and pastoral care offered.  Emotional support given to patient and to family.  Comfort cart provided.

## 2021-01-01 NOTE — Progress Notes (Signed)
No palpable pulse at this time.  Respirations have ceased.  Time of death 42.  Death verified with Hiram Gash, RN.  Family in attendance.

## 2021-01-01 NOTE — ED Triage Notes (Signed)
Pt BIB [facility] d/t being found unresponsive/ GCS 3. Son reports LKN 0300. EMS reports pt vomited, incontinent of urine, CBG 453 and showing A-Fib (Hx of same) on the monitor, Tympanic was 101.1, he received 1000 mL NS, has snoring respirations on NRB upon arrival. Intubated once in room per EDP Ray.

## 2021-01-01 NOTE — ED Notes (Signed)
EDP Ray notified of critical lactic result in person.

## 2021-01-01 NOTE — ED Notes (Signed)
Family remains at bedside after Neurology updated them on pt condition.

## 2021-01-01 DEATH — deceased

## 2021-01-05 ENCOUNTER — Other Ambulatory Visit: Payer: Medicare Other

## 2021-01-12 ENCOUNTER — Ambulatory Visit: Payer: Medicare Other | Admitting: Oncology

## 2021-10-30 IMAGING — DX DG CHEST 1V PORT
1 series · 1 of 1 positions shown · non-contrast
Comparison: PA and lateral chest 06/23/2020.

CLINICAL DATA: Status post intubation. Vomiting and urinary
incontinence. Fever.

EXAM:
PORTABLE CHEST 1 VIEW

[chest ap]
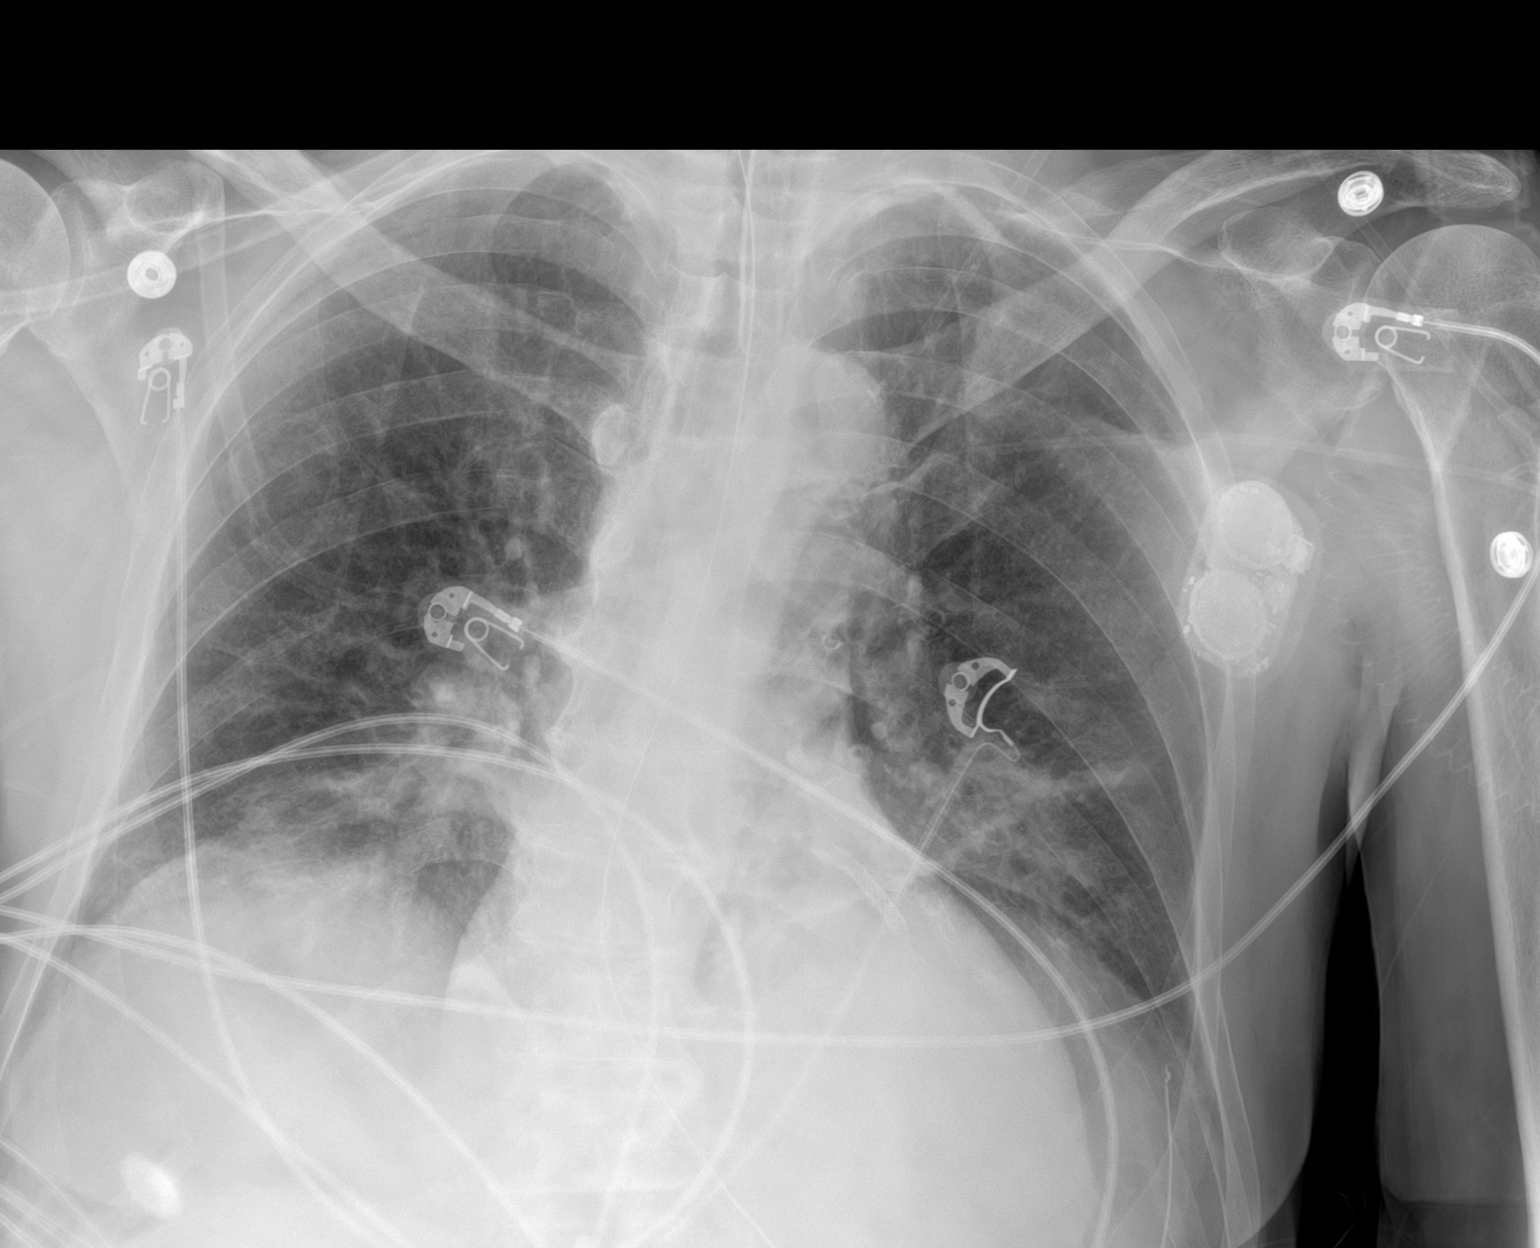

[1 of 1 positions shown; findings below may reference images not displayed]

FINDINGS: NG tube courses into the stomach with its tip seen in the fundus.
Endotracheal tube is approximately 5 cm above the carina. There is
patchy bibasilar airspace disease, worse on the left. No
pneumothorax.
IMPRESSION: Patchy bibasilar airspace disease could be atelectasis or pneumonia
due.

ETT to atelectasis and NG tube in good position.
